# Patient Record
Sex: Male | Born: 1955 | Race: White | Hispanic: No | Marital: Married | Smoking: Never smoker
Health system: Southern US, Community
[De-identification: ages and names within clinical notes are randomized; demographics above are authoritative.]

## PROBLEM LIST (undated history)

## (undated) DIAGNOSIS — I10 Essential (primary) hypertension: Secondary | ICD-10-CM

## (undated) DIAGNOSIS — E785 Hyperlipidemia, unspecified: Secondary | ICD-10-CM

## (undated) DIAGNOSIS — E119 Type 2 diabetes mellitus without complications: Secondary | ICD-10-CM

## (undated) DIAGNOSIS — D631 Anemia in chronic kidney disease: Secondary | ICD-10-CM

## (undated) DIAGNOSIS — Z940 Kidney transplant status: Secondary | ICD-10-CM

## (undated) DIAGNOSIS — N289 Disorder of kidney and ureter, unspecified: Secondary | ICD-10-CM

## (undated) DIAGNOSIS — C801 Malignant (primary) neoplasm, unspecified: Secondary | ICD-10-CM

## (undated) DIAGNOSIS — E611 Iron deficiency: Secondary | ICD-10-CM

## (undated) DIAGNOSIS — H548 Legal blindness, as defined in USA: Secondary | ICD-10-CM

## (undated) DIAGNOSIS — H269 Unspecified cataract: Secondary | ICD-10-CM

## (undated) DIAGNOSIS — E213 Hyperparathyroidism, unspecified: Secondary | ICD-10-CM

## (undated) HISTORY — PX: HERNIA REPAIR: SHX51

## (undated) HISTORY — PX: COLONOSCOPY: SHX174

## (undated) HISTORY — PX: KIDNEY TRANSPLANT: SHX239

## (undated) HISTORY — DX: Hyperparathyroidism, unspecified: E21.3

## (undated) HISTORY — PX: AV FISTULA PLACEMENT: SHX1204

## (undated) HISTORY — DX: Iron deficiency: E61.1

## (undated) HISTORY — DX: Anemia in chronic kidney disease: D63.1

## (undated) HISTORY — PX: NEPHRECTOMY TRANSPLANTED ORGAN: SUR880

## (undated) HISTORY — PX: COMBINED KIDNEY-PANCREAS TRANSPLANT: SHX1382

## (undated) HISTORY — PX: EYE SURGERY: SHX253

---

## 1998-08-06 ENCOUNTER — Encounter: Payer: Self-pay | Admitting: Vascular Surgery

## 1998-08-06 ENCOUNTER — Ambulatory Visit (HOSPITAL_COMMUNITY): Admission: RE | Admit: 1998-08-06 | Discharge: 1998-08-06 | Payer: Self-pay | Admitting: Vascular Surgery

## 1998-08-22 ENCOUNTER — Encounter (HOSPITAL_COMMUNITY): Admission: RE | Admit: 1998-08-22 | Discharge: 1998-11-20 | Payer: Self-pay | Admitting: *Deleted

## 1998-10-20 ENCOUNTER — Emergency Department (HOSPITAL_COMMUNITY): Admission: EM | Admit: 1998-10-20 | Discharge: 1998-10-20 | Payer: Self-pay | Admitting: Emergency Medicine

## 1998-10-27 ENCOUNTER — Ambulatory Visit (HOSPITAL_COMMUNITY): Admission: RE | Admit: 1998-10-27 | Discharge: 1998-10-27 | Payer: Self-pay | Admitting: Thoracic Surgery

## 1998-10-27 ENCOUNTER — Encounter: Payer: Self-pay | Admitting: Thoracic Surgery

## 1998-11-04 ENCOUNTER — Ambulatory Visit (HOSPITAL_COMMUNITY): Admission: RE | Admit: 1998-11-04 | Discharge: 1998-11-04 | Payer: Self-pay | Admitting: Nephrology

## 1998-11-04 ENCOUNTER — Encounter: Payer: Self-pay | Admitting: Nephrology

## 1998-11-19 ENCOUNTER — Ambulatory Visit (HOSPITAL_COMMUNITY): Admission: RE | Admit: 1998-11-19 | Discharge: 1998-11-19 | Payer: Self-pay | Admitting: *Deleted

## 1999-01-01 ENCOUNTER — Ambulatory Visit (HOSPITAL_COMMUNITY): Admission: RE | Admit: 1999-01-01 | Discharge: 1999-01-01 | Payer: Self-pay | Admitting: Vascular Surgery

## 1999-05-08 ENCOUNTER — Ambulatory Visit (HOSPITAL_COMMUNITY): Admission: RE | Admit: 1999-05-08 | Discharge: 1999-05-08 | Payer: Self-pay | Admitting: Vascular Surgery

## 1999-05-10 ENCOUNTER — Encounter: Payer: Self-pay | Admitting: Vascular Surgery

## 1999-05-10 ENCOUNTER — Ambulatory Visit (HOSPITAL_COMMUNITY): Admission: EM | Admit: 1999-05-10 | Discharge: 1999-05-10 | Payer: Self-pay | Admitting: Emergency Medicine

## 1999-11-11 ENCOUNTER — Encounter: Payer: Self-pay | Admitting: Vascular Surgery

## 1999-11-11 ENCOUNTER — Ambulatory Visit: Admission: RE | Admit: 1999-11-11 | Discharge: 1999-11-11 | Payer: Self-pay | Admitting: Vascular Surgery

## 2000-02-09 ENCOUNTER — Ambulatory Visit (HOSPITAL_COMMUNITY): Admission: RE | Admit: 2000-02-09 | Discharge: 2000-02-09 | Payer: Self-pay | Admitting: Vascular Surgery

## 2000-02-09 ENCOUNTER — Encounter: Payer: Self-pay | Admitting: Vascular Surgery

## 2000-02-23 ENCOUNTER — Ambulatory Visit (HOSPITAL_COMMUNITY): Admission: RE | Admit: 2000-02-23 | Discharge: 2000-02-23 | Payer: Self-pay | Admitting: Nephrology

## 2000-02-23 ENCOUNTER — Encounter: Payer: Self-pay | Admitting: Nephrology

## 2003-03-30 DIAGNOSIS — N289 Disorder of kidney and ureter, unspecified: Secondary | ICD-10-CM

## 2003-03-30 DIAGNOSIS — Z940 Kidney transplant status: Secondary | ICD-10-CM

## 2003-03-30 HISTORY — DX: Disorder of kidney and ureter, unspecified: N28.9

## 2003-03-30 HISTORY — DX: Kidney transplant status: Z94.0

## 2003-10-26 ENCOUNTER — Emergency Department (HOSPITAL_COMMUNITY): Admission: EM | Admit: 2003-10-26 | Discharge: 2003-10-26 | Payer: Self-pay | Admitting: Emergency Medicine

## 2008-10-22 ENCOUNTER — Encounter: Admission: RE | Admit: 2008-10-22 | Discharge: 2008-10-22 | Payer: Self-pay | Admitting: Nephrology

## 2009-07-14 ENCOUNTER — Observation Stay (HOSPITAL_COMMUNITY): Admission: EM | Admit: 2009-07-14 | Discharge: 2009-07-14 | Payer: Self-pay | Admitting: Emergency Medicine

## 2010-06-16 LAB — COMPREHENSIVE METABOLIC PANEL
Alkaline Phosphatase: 83 U/L (ref 39–117)
BUN: 20 mg/dL (ref 6–23)
CO2: 25 mEq/L (ref 19–32)
Calcium: 8.8 mg/dL (ref 8.4–10.5)
Creatinine, Ser: 1.71 mg/dL — ABNORMAL HIGH (ref 0.4–1.5)
Glucose, Bld: 126 mg/dL — ABNORMAL HIGH (ref 70–99)
Potassium: 3.7 mEq/L (ref 3.5–5.1)

## 2010-06-16 LAB — URINALYSIS, ROUTINE W REFLEX MICROSCOPIC
Bilirubin Urine: NEGATIVE
Glucose, UA: NEGATIVE mg/dL
Urobilinogen, UA: 0.2 mg/dL (ref 0.0–1.0)
pH: 6.5 (ref 5.0–8.0)

## 2010-06-16 LAB — CULTURE, BLOOD (ROUTINE X 2): Culture: NO GROWTH

## 2010-06-16 LAB — BASIC METABOLIC PANEL
CO2: 28 mEq/L (ref 19–32)
Chloride: 100 mEq/L (ref 96–112)
GFR calc Af Amer: 48 mL/min — ABNORMAL LOW (ref >60)
GFR calc non Af Amer: 40 mL/min — ABNORMAL LOW (ref >60)
Glucose, Bld: 177 mg/dL — ABNORMAL HIGH (ref 70–99)

## 2010-06-16 LAB — CBC
HCT: 38.6 % — ABNORMAL LOW (ref 39.0–52.0)
Hemoglobin: 12.7 g/dL — ABNORMAL LOW (ref 13.0–17.0)
Hemoglobin: 15.5 g/dL (ref 13.0–17.0)
MCHC: 33.2 g/dL (ref 30.0–36.0)
MCV: 89.5 fL (ref 78.0–100.0)
Platelets: 292 10*3/uL (ref 150–400)
RDW: 14.2 % (ref 11.5–15.5)
RDW: 14.2 % (ref 11.5–15.5)
WBC: 12.3 10*3/uL — ABNORMAL HIGH (ref 4.0–10.5)

## 2010-06-16 LAB — DIFFERENTIAL
Basophils Absolute: 0.1 10*3/uL (ref 0.0–0.1)
Basophils Relative: 0 % (ref 0–1)
Eosinophils Absolute: 0 10*3/uL (ref 0.0–0.7)
Eosinophils Relative: 0 % (ref 0–5)
Eosinophils Relative: 0 % (ref 0–5)
Lymphocytes Relative: 2 % — ABNORMAL LOW (ref 12–46)
Lymphs Abs: 0.4 10*3/uL — ABNORMAL LOW (ref 0.7–4.0)
Monocytes Absolute: 1.7 10*3/uL — ABNORMAL HIGH (ref 0.1–1.0)
Monocytes Relative: 14 % — ABNORMAL HIGH (ref 3–12)
Monocytes Relative: 8 % (ref 3–12)
Neutro Abs: 18.2 10*3/uL — ABNORMAL HIGH (ref 1.7–7.7)

## 2010-06-16 LAB — POCT I-STAT, CHEM 8
Calcium, Ion: 1.18 mmol/L (ref 1.12–1.32)
Creatinine, Ser: 1.8 mg/dL — ABNORMAL HIGH (ref 0.4–1.5)
Glucose, Bld: 176 mg/dL — ABNORMAL HIGH (ref 70–99)
HCT: 54 % — ABNORMAL HIGH (ref 39.0–52.0)

## 2010-06-16 LAB — GLUCOSE, CAPILLARY: Glucose-Capillary: 145 mg/dL — ABNORMAL HIGH (ref 70–99)

## 2010-06-16 LAB — LIPID PANEL
Triglycerides: 119 mg/dL (ref <150)
VLDL: 24 mg/dL (ref 0–40)

## 2010-06-16 LAB — PROTIME-INR: INR: 1.01 (ref 0.00–1.49)

## 2010-06-16 LAB — TACROLIMUS, BLOOD: Tacrolimus Lvl: 11.3 ng/mL (ref 5.0–20.0)

## 2010-06-16 LAB — URINE MICROSCOPIC-ADD ON

## 2010-06-16 LAB — URINE CULTURE: Culture: NO GROWTH

## 2010-06-16 LAB — POCT CARDIAC MARKERS: Myoglobin, poc: 331 ng/mL (ref 12–200)

## 2010-06-16 LAB — HEPATIC FUNCTION PANEL: AST: 22 U/L (ref 0–37)

## 2010-06-16 LAB — HEMOGLOBIN A1C: Hgb A1c MFr Bld: 7.5 % — ABNORMAL HIGH (ref <5.7)

## 2010-08-14 NOTE — Op Note (Signed)
[facility]  Patient:    Eric Mccarty, Eric Mccarty                       MRN: [id number] Proc. Date: 11/11/99 Attending:  Nelda Severe. Kellie Simmering, M.D.                           Operative Report  PREOPERATIVE DIAGNOSIS:  Thrombosed AV Gore-Tex graft left forearm.  POSTOPERATIVE DIAGNOSIS:  Thrombosed AV Gore-Tex graft left forearm.  OPERATION:  Thrombectomy AV Gore-Tex graft left forearm with insertion of new segment of graft from existing graft to basilic vein with 6 mm Gore-Tex.  SURGEON:  Nelda Severe. Kellie Simmering, M.D.  FIRST ASSISTANT:  Nurse.  ANESTHESIA:  Local.  PROCEDURE:  The patient was taken to the operating room and placed in supine position.  At which time, the left upper extremity was prepped with Betadine scrubbing solution and draped in routine sterile manner.  After infiltration of 1% Xylocaine with epinephrine, a longitudinal incision was made through the antecubital area through the previous scar.  The Gore-Tex and basilic vein anastomosis was dissected free proximally and distally and 3000 units of heparin given intravenously.  A transverse opening was made in the graft proximally, anastomosis graft itself easily and thrombectomized with excellent inflow being reestablished.  There was some interval hyperplasia at the venous anastomosis and over the last 2 cm of the graft and this area was completely resected.  The vein proximal to this was 5 mm in size and had excellent back bleeding.  A new piece of 6 mm graft was anastomosed end-to-end to the old graft and end-to-end to the vein with 6-0 Prolene.  Clamps were released and there was an excellent pulse and thrill in the graft.  No Protamine was given. The wound was irrigated with saline.  It was closed in layers with Vicryl in a subcuticular fashion.  Sterile dressing was applied.  The patient was taken to the recovery room in satisfactory condition. DD:  11/11/99 TD:  11/11/99 Job:  [id number]

## 2010-08-14 NOTE — Op Note (Signed)
[place]  Patient:    Eric Mccarty, Eric Mccarty                    MRN: [id number] Proc. Date: 02/09/00 Adm. Date:  [id number] Attending:  Dorothea Glassman CC:         Keturah Barre Marlyn Corporal, M.D. (2)  Joyice Faster. Deterding, M.D.   Operative Report  PREOPERATIVE DIAGNOSIS:  Thrombosed left arm arteriovenous Gore-Tex graft.  POSTOPERATIVE DIAGNOSIS:  Thrombosed left arm arteriovenous Gore-Tex graft.  OPERATION:  Thrombectomy of left arm arteriovenous Gore-Tex graft, with patch angioplasty to the venous end.  SURGEON:  D. Marlyn Corporal, M.D.  ASSISTANT:  Ricki Miller, P.A.-C.  ANESTHESIA:  Xylocaine 1%, IV sedation.  DESCRIPTION OF PROCEDURE:  After IV sedation, the left arm was prepped and draped in the usual sterile manner.  The area was infiltrated with 1% Xylocaine at the venous end, which is above the antecubital fossa, and the previous longitudinal incision was opened, and the venous end was dissected out.  It was an end-to-end anastomosis.  The patient was given 4000 units of heparin.  The longitudinal incision was made in the Gore-Tex, and it was extended up through the end-to-end vein. There was a mild intimal hyperplasia at the anastomosis.  This was removed.  A proximal and distal thrombectomy was carried out with the #5 Fogarty catheter, with good backbleeding and excellent inflow.  The graft was then clamped and then taking the Gore-Tex patch, a patch angioplasty was performed in an oval-shaped fashion using #6-0 Prolene in a running continuous fashion.  All clamps were removed.  The wound was closed with #3-0 Vicryl and Steri-Strips.  The patient returned to the recovery room in stable condition.DD:  02/09/00 TD:  02/09/00 Job: [id number]

## 2013-01-15 ENCOUNTER — Emergency Department (HOSPITAL_COMMUNITY): Payer: BC Managed Care – PPO | Admitting: Anesthesiology

## 2013-01-15 ENCOUNTER — Inpatient Hospital Stay (HOSPITAL_BASED_OUTPATIENT_CLINIC_OR_DEPARTMENT_OTHER)
Admission: EM | Admit: 2013-01-15 | Discharge: 2013-01-17 | DRG: 264 | Disposition: A | Discharge status: Home / Self Care | Payer: BC Managed Care – PPO | Attending: Vascular Surgery | Admitting: Vascular Surgery

## 2013-01-15 ENCOUNTER — Encounter (HOSPITAL_COMMUNITY)
Admission: EM | Disposition: A | Discharge status: Home / Self Care | Payer: Self-pay | Source: Home / Self Care | Attending: Vascular Surgery

## 2013-01-15 ENCOUNTER — Encounter (HOSPITAL_COMMUNITY): Payer: BC Managed Care – PPO | Admitting: Anesthesiology

## 2013-01-15 ENCOUNTER — Encounter (HOSPITAL_BASED_OUTPATIENT_CLINIC_OR_DEPARTMENT_OTHER): Payer: Self-pay | Admitting: Emergency Medicine

## 2013-01-15 DIAGNOSIS — E119 Type 2 diabetes mellitus without complications: Secondary | ICD-10-CM | POA: Diagnosis present

## 2013-01-15 DIAGNOSIS — Z940 Kidney transplant status: Secondary | ICD-10-CM

## 2013-01-15 DIAGNOSIS — E785 Hyperlipidemia, unspecified: Secondary | ICD-10-CM | POA: Diagnosis present

## 2013-01-15 DIAGNOSIS — Z794 Long term (current) use of insulin: Secondary | ICD-10-CM

## 2013-01-15 DIAGNOSIS — I10 Essential (primary) hypertension: Secondary | ICD-10-CM | POA: Diagnosis present

## 2013-01-15 DIAGNOSIS — Z79899 Other long term (current) drug therapy: Secondary | ICD-10-CM

## 2013-01-15 DIAGNOSIS — Z23 Encounter for immunization: Secondary | ICD-10-CM

## 2013-01-15 DIAGNOSIS — T82898A Other specified complication of vascular prosthetic devices, implants and grafts, initial encounter: Principal | ICD-10-CM | POA: Diagnosis present

## 2013-01-15 DIAGNOSIS — Z9483 Pancreas transplant status: Secondary | ICD-10-CM

## 2013-01-15 DIAGNOSIS — I742 Embolism and thrombosis of arteries of the upper extremities: Secondary | ICD-10-CM

## 2013-01-15 DIAGNOSIS — Y838 Other surgical procedures as the cause of abnormal reaction of the patient, or of later complication, without mention of misadventure at the time of the procedure: Secondary | ICD-10-CM | POA: Diagnosis present

## 2013-01-15 DIAGNOSIS — Z7982 Long term (current) use of aspirin: Secondary | ICD-10-CM

## 2013-01-15 DIAGNOSIS — I748 Embolism and thrombosis of other arteries: Secondary | ICD-10-CM | POA: Diagnosis present

## 2013-01-15 HISTORY — DX: Type 2 diabetes mellitus without complications: E11.9

## 2013-01-15 HISTORY — DX: Essential (primary) hypertension: I10

## 2013-01-15 HISTORY — DX: Disorder of kidney and ureter, unspecified: N28.9

## 2013-01-15 HISTORY — DX: Hyperlipidemia, unspecified: E78.5

## 2013-01-15 HISTORY — PX: EMBOLECTOMY: SHX44

## 2013-01-15 LAB — PROTIME-INR: Prothrombin Time: 13.1 seconds (ref 11.6–15.2)

## 2013-01-15 LAB — CBC WITH DIFFERENTIAL/PLATELET
Eosinophils Absolute: 0 10*3/uL (ref 0.0–0.7)
HCT: 39.2 % (ref 39.0–52.0)
Hemoglobin: 12.5 g/dL — ABNORMAL LOW (ref 13.0–17.0)
Lymphs Abs: 1.3 10*3/uL (ref 0.7–4.0)
MCH: 28.1 pg (ref 26.0–34.0)
Monocytes Absolute: 1 10*3/uL (ref 0.1–1.0)
Monocytes Relative: 11 % (ref 3–12)
Neutrophils Relative %: 74 % (ref 43–77)
RBC: 4.45 MIL/uL (ref 4.22–5.81)

## 2013-01-15 LAB — GLUCOSE, CAPILLARY: Glucose-Capillary: 100 mg/dL — ABNORMAL HIGH (ref 70–99)

## 2013-01-15 LAB — COMPREHENSIVE METABOLIC PANEL
Alkaline Phosphatase: 103 U/L (ref 39–117)
BUN: 43 mg/dL — ABNORMAL HIGH (ref 6–23)
Chloride: 97 mEq/L (ref 96–112)
GFR calc Af Amer: 42 mL/min — ABNORMAL LOW (ref >90)
Glucose, Bld: 214 mg/dL — ABNORMAL HIGH (ref 70–99)
Potassium: 4.2 mEq/L (ref 3.5–5.1)
Total Bilirubin: 0.3 mg/dL (ref 0.3–1.2)
Total Protein: 7.2 g/dL (ref 6.0–8.3)

## 2013-01-15 SURGERY — EMBOLECTOMY
Anesthesia: General | Site: Arm Upper | Laterality: Left | Wound class: Clean

## 2013-01-15 MED ORDER — OXYCODONE HCL 5 MG PO TABS: 5.0000 mg | ORAL_TABLET | ORAL | Status: DC | PRN

## 2013-01-15 MED ORDER — PIOGLITAZONE HCL 45 MG PO TABS
45.0000 mg | ORAL_TABLET | Freq: Every day | ORAL | Status: DC
  Administered 2013-01-16 – 2013-01-17 (×2): 45 mg via ORAL
  Filled 2013-01-15 (×2): qty 1

## 2013-01-15 MED ORDER — HYDRALAZINE HCL 20 MG/ML IJ SOLN: 10.0000 mg | INTRAMUSCULAR | Status: DC | PRN

## 2013-01-15 MED ORDER — HYDROMORPHONE HCL PF 1 MG/ML IJ SOLN: 0.2500 mg | INTRAMUSCULAR | Status: DC | PRN

## 2013-01-15 MED ORDER — SODIUM CHLORIDE 0.9 % IV SOLN
INTRAVENOUS | Status: DC
  Administered 2013-01-15 – 2013-01-17 (×3): via INTRAVENOUS

## 2013-01-15 MED ORDER — ATORVASTATIN CALCIUM 10 MG PO TABS
10.0000 mg | ORAL_TABLET | Freq: Every day | ORAL | Status: DC
  Administered 2013-01-15 – 2013-01-16 (×2): 10 mg via ORAL
  Filled 2013-01-15 (×3): qty 1

## 2013-01-15 MED ORDER — CLONIDINE HCL 0.3 MG PO TABS
0.3000 mg | ORAL_TABLET | Freq: Three times a day (TID) | ORAL | Status: DC
Start: 2013-01-15 — End: 2013-01-17
  Administered 2013-01-15 – 2013-01-17 (×5): 0.3 mg via ORAL
  Filled 2013-01-15 (×7): qty 1

## 2013-01-15 MED ORDER — ACETAMINOPHEN 325 MG PO TABS: 325.0000 mg | ORAL_TABLET | ORAL | Status: DC | PRN

## 2013-01-15 MED ORDER — CALCITRIOL 0.25 MCG PO CAPS
0.2500 ug | ORAL_CAPSULE | Freq: Every day | ORAL | Status: DC
  Administered 2013-01-16 – 2013-01-17 (×2): 0.25 ug via ORAL
  Filled 2013-01-15 (×2): qty 1

## 2013-01-15 MED ORDER — SODIUM CHLORIDE 0.9 % IV SOLN: 500.0000 mL | Freq: Once | INTRAVENOUS | Status: AC | PRN

## 2013-01-15 MED ORDER — SUCCINYLCHOLINE CHLORIDE 20 MG/ML IJ SOLN
INTRAMUSCULAR | Status: DC | PRN
  Administered 2013-01-15: 100 mg via INTRAVENOUS

## 2013-01-15 MED ORDER — SENNOSIDES-DOCUSATE SODIUM 8.6-50 MG PO TABS
1.0000 | ORAL_TABLET | Freq: Two times a day (BID) | ORAL | Status: DC
  Administered 2013-01-16 – 2013-01-17 (×3): 1 via ORAL
  Filled 2013-01-15 (×5): qty 1

## 2013-01-15 MED ORDER — LABETALOL HCL 5 MG/ML IV SOLN
10.0000 mg | INTRAVENOUS | Status: DC | PRN
  Administered 2013-01-15 – 2013-01-17 (×2): 10 mg via INTRAVENOUS
  Filled 2013-01-15: qty 4

## 2013-01-15 MED ORDER — SODIUM CHLORIDE 0.9 % IR SOLN
Status: DC | PRN
  Administered 2013-01-15: 18:00:00

## 2013-01-15 MED ORDER — ONDANSETRON HCL 4 MG/2ML IJ SOLN: 4.0000 mg | Freq: Four times a day (QID) | INTRAMUSCULAR | Status: DC | PRN

## 2013-01-15 MED ORDER — MORPHINE SULFATE 2 MG/ML IJ SOLN: 2.0000 mg | INTRAMUSCULAR | Status: DC | PRN

## 2013-01-15 MED ORDER — 0.9 % SODIUM CHLORIDE (POUR BTL) OPTIME
TOPICAL | Status: DC | PRN
  Administered 2013-01-15: 1000 mL

## 2013-01-15 MED ORDER — MEPERIDINE HCL 25 MG/ML IJ SOLN: 6.2500 mg | INTRAMUSCULAR | Status: DC | PRN

## 2013-01-15 MED ORDER — THROMBIN 20000 UNITS EX KIT
PACK | CUTANEOUS | Status: DC | PRN
  Administered 2013-01-15: 19:00:00 via TOPICAL

## 2013-01-15 MED ORDER — HEPARIN SODIUM (PORCINE) 1000 UNIT/ML IJ SOLN
INTRAMUSCULAR | Status: DC | PRN
  Administered 2013-01-15: 8000 [IU] via INTRAVENOUS

## 2013-01-15 MED ORDER — CEFAZOLIN SODIUM-DEXTROSE 2-3 GM-% IV SOLR
2.0000 g | INTRAVENOUS | Status: AC
  Administered 2013-01-15: 2 g via INTRAVENOUS
  Filled 2013-01-15: qty 50

## 2013-01-15 MED ORDER — THROMBIN 20000 UNITS EX SOLR
CUTANEOUS | Status: AC
  Filled 2013-01-15: qty 20000

## 2013-01-15 MED ORDER — INSULIN GLARGINE 100 UNIT/ML ~~LOC~~ SOLN
18.0000 [IU] | Freq: Every day | SUBCUTANEOUS | Status: DC
  Administered 2013-01-15 – 2013-01-16 (×2): 18 [IU] via SUBCUTANEOUS
  Filled 2013-01-15 (×3): qty 0.18

## 2013-01-15 MED ORDER — LACTATED RINGERS IV SOLN
INTRAVENOUS | Status: DC | PRN
  Administered 2013-01-15 (×2): via INTRAVENOUS

## 2013-01-15 MED ORDER — LACTATED RINGERS IV SOLN
INTRAVENOUS | Status: DC
  Administered 2013-01-15: 17:00:00 via INTRAVENOUS

## 2013-01-15 MED ORDER — INSULIN ASPART 100 UNIT/ML ~~LOC~~ SOLN
0.0000 [IU] | Freq: Three times a day (TID) | SUBCUTANEOUS | Status: DC
  Administered 2013-01-16: 2 [IU] via SUBCUTANEOUS
  Administered 2013-01-16: 1 [IU] via SUBCUTANEOUS
  Administered 2013-01-17: 2 [IU] via SUBCUTANEOUS

## 2013-01-15 MED ORDER — PROPOFOL 10 MG/ML IV BOLUS
INTRAVENOUS | Status: DC | PRN
  Administered 2013-01-15: 30 mg via INTRAVENOUS
  Administered 2013-01-15: 110 mg via INTRAVENOUS

## 2013-01-15 MED ORDER — ACETAMINOPHEN 650 MG RE SUPP: 325.0000 mg | RECTAL | Status: DC | PRN

## 2013-01-15 MED ORDER — PHENOL 1.4 % MT LIQD: 1.0000 | OROMUCOSAL | Status: DC | PRN

## 2013-01-15 MED ORDER — CEFAZOLIN SODIUM 1-5 GM-% IV SOLN
1.0000 g | Freq: Two times a day (BID) | INTRAVENOUS | Status: AC
  Administered 2013-01-16: 1 g via INTRAVENOUS
  Filled 2013-01-15 (×2): qty 50

## 2013-01-15 MED ORDER — LABETALOL HCL 200 MG PO TABS
200.0000 mg | ORAL_TABLET | Freq: Two times a day (BID) | ORAL | Status: DC
  Administered 2013-01-15 – 2013-01-17 (×4): 200 mg via ORAL
  Filled 2013-01-15 (×5): qty 1

## 2013-01-15 MED ORDER — ONDANSETRON HCL 4 MG/2ML IJ SOLN: 4.0000 mg | Freq: Once | INTRAMUSCULAR | Status: DC | PRN

## 2013-01-15 MED ORDER — CEFAZOLIN SODIUM-DEXTROSE 2-3 GM-% IV SOLR
INTRAVENOUS | Status: AC
  Filled 2013-01-15: qty 50

## 2013-01-15 MED ORDER — METOPROLOL TARTRATE 1 MG/ML IV SOLN: 2.0000 mg | INTRAVENOUS | Status: DC | PRN

## 2013-01-15 MED ORDER — AMITRIPTYLINE HCL 25 MG PO TABS
25.0000 mg | ORAL_TABLET | Freq: Every day | ORAL | Status: DC
  Administered 2013-01-15 – 2013-01-16 (×2): 25 mg via ORAL
  Filled 2013-01-15 (×3): qty 1

## 2013-01-15 MED ORDER — OXYCODONE HCL 5 MG PO TABS: 5.0000 mg | ORAL_TABLET | Freq: Once | ORAL | Status: DC | PRN

## 2013-01-15 MED ORDER — FUROSEMIDE 20 MG PO TABS
20.0000 mg | ORAL_TABLET | Freq: Two times a day (BID) | ORAL | Status: DC
  Administered 2013-01-16 – 2013-01-17 (×3): 20 mg via ORAL
  Filled 2013-01-15 (×5): qty 1

## 2013-01-15 MED ORDER — ONDANSETRON HCL 4 MG/2ML IJ SOLN
INTRAMUSCULAR | Status: DC | PRN
  Administered 2013-01-15: 4 mg via INTRAVENOUS

## 2013-01-15 MED ORDER — GLIPIZIDE 10 MG PO TABS
10.0000 mg | ORAL_TABLET | Freq: Two times a day (BID) | ORAL | Status: DC
Start: 2013-01-16 — End: 2013-01-17
  Administered 2013-01-16 – 2013-01-17 (×3): 10 mg via ORAL
  Filled 2013-01-15 (×5): qty 1

## 2013-01-15 MED ORDER — HEPARIN (PORCINE) IN NACL 100-0.45 UNIT/ML-% IJ SOLN
1200.0000 [IU]/h | INTRAMUSCULAR | Status: DC
  Administered 2013-01-15 – 2013-01-16 (×2): 1200 [IU]/h via INTRAVENOUS
  Filled 2013-01-15 (×4): qty 250

## 2013-01-15 MED ORDER — FENTANYL CITRATE 0.05 MG/ML IJ SOLN
INTRAMUSCULAR | Status: DC | PRN
  Administered 2013-01-15: 50 ug via INTRAVENOUS
  Administered 2013-01-15: 150 ug via INTRAVENOUS
  Administered 2013-01-15 (×2): 25 ug via INTRAVENOUS

## 2013-01-15 MED ORDER — LABETALOL HCL 5 MG/ML IV SOLN
INTRAVENOUS | Status: AC
  Filled 2013-01-15: qty 4

## 2013-01-15 MED ORDER — MYCOPHENOLATE MOFETIL 250 MG PO CAPS
500.0000 mg | ORAL_CAPSULE | Freq: Three times a day (TID) | ORAL | Status: DC
  Administered 2013-01-15 – 2013-01-17 (×5): 500 mg via ORAL
  Filled 2013-01-15 (×7): qty 2

## 2013-01-15 MED ORDER — TACROLIMUS 1 MG PO CAPS
1.0000 mg | ORAL_CAPSULE | Freq: Two times a day (BID) | ORAL | Status: DC
  Administered 2013-01-15 – 2013-01-17 (×4): 1 mg via ORAL
  Filled 2013-01-15 (×5): qty 1

## 2013-01-15 MED ORDER — LIDOCAINE HCL (CARDIAC) 20 MG/ML IV SOLN
INTRAVENOUS | Status: DC | PRN
  Administered 2013-01-15: 50 mg via INTRAVENOUS

## 2013-01-15 MED ORDER — PNEUMOCOCCAL VAC POLYVALENT 25 MCG/0.5ML IJ INJ
0.5000 mL | INJECTION | INTRAMUSCULAR | Status: AC
Start: 2013-01-16 — End: 2013-01-16
  Administered 2013-01-16: 0.5 mL via INTRAMUSCULAR
  Filled 2013-01-15: qty 0.5

## 2013-01-15 MED ORDER — THROMBIN 20000 UNITS EX KIT
PACK | CUTANEOUS | Status: AC
  Filled 2013-01-15: qty 1

## 2013-01-15 MED ORDER — MIDAZOLAM HCL 5 MG/5ML IJ SOLN
INTRAMUSCULAR | Status: DC | PRN
  Administered 2013-01-15: 2 mg via INTRAVENOUS

## 2013-01-15 MED ORDER — OXYCODONE HCL 5 MG/5ML PO SOLN: 5.0000 mg | Freq: Once | ORAL | Status: DC | PRN

## 2013-01-15 SURGICAL SUPPLY — 61 items
ADH SKN CLS APL DERMABOND .7 (GAUZE/BANDAGES/DRESSINGS) ×1
BANDAGE ESMARK 6X9 LF (GAUZE/BANDAGES/DRESSINGS) IMPLANT
BNDG CMPR 9X6 STRL LF SNTH (GAUZE/BANDAGES/DRESSINGS)
BNDG ESMARK 6X9 LF (GAUZE/BANDAGES/DRESSINGS)
CANISTER SUCTION 2500CC (MISCELLANEOUS) ×2 IMPLANT
CATH EMB 3FR 80CM (CATHETERS) ×1 IMPLANT
CATH EMB 4FR 80CM (CATHETERS) IMPLANT
CATH EMB 5FR 80CM (CATHETERS) IMPLANT
CLIP TI MEDIUM 24 (CLIP) ×2 IMPLANT
CLIP TI WIDE RED SMALL 24 (CLIP) ×2 IMPLANT
CONT SPEC 4OZ CLIKSEAL STRL BL (MISCELLANEOUS) ×1 IMPLANT
COVER SURGICAL LIGHT HANDLE (MISCELLANEOUS) ×2 IMPLANT
CUFF TOURNIQUET SINGLE 24IN (TOURNIQUET CUFF) IMPLANT
CUFF TOURNIQUET SINGLE 34IN LL (TOURNIQUET CUFF) IMPLANT
CUFF TOURNIQUET SINGLE 44IN (TOURNIQUET CUFF) IMPLANT
DECANTER SPIKE VIAL GLASS SM (MISCELLANEOUS) IMPLANT
DERMABOND ADVANCED (GAUZE/BANDAGES/DRESSINGS) ×1
DERMABOND ADVANCED .7 DNX12 (GAUZE/BANDAGES/DRESSINGS) IMPLANT
DRAIN SNY 10X20 3/4 PERF (WOUND CARE) IMPLANT
DRAPE WARM FLUID 44X44 (DRAPE) ×1 IMPLANT
DRAPE X-RAY CASS 24X20 (DRAPES) IMPLANT
DRSG COVADERM 4X8 (GAUZE/BANDAGES/DRESSINGS) IMPLANT
ELECT REM PT RETURN 9FT ADLT (ELECTROSURGICAL) ×2
ELECTRODE REM PT RTRN 9FT ADLT (ELECTROSURGICAL) ×1 IMPLANT
EVACUATOR SILICONE 100CC (DRAIN) IMPLANT
GLOVE BIO SURGEON STRL SZ7.5 (GLOVE) ×2 IMPLANT
GLOVE BIOGEL PI IND STRL 7.0 (GLOVE) IMPLANT
GLOVE BIOGEL PI IND STRL 7.5 (GLOVE) IMPLANT
GLOVE BIOGEL PI INDICATOR 7.0 (GLOVE) ×2
GLOVE BIOGEL PI INDICATOR 7.5 (GLOVE) ×1
GLOVE SS BIOGEL STRL SZ 6.5 (GLOVE) IMPLANT
GLOVE SUPERSENSE BIOGEL SZ 6.5 (GLOVE) ×1
GOWN PREVENTION PLUS XLARGE (GOWN DISPOSABLE) ×2 IMPLANT
GOWN STRL NON-REIN LRG LVL3 (GOWN DISPOSABLE) ×5 IMPLANT
KIT BASIN OR (CUSTOM PROCEDURE TRAY) ×2 IMPLANT
KIT ROOM TURNOVER OR (KITS) ×2 IMPLANT
LOOP VESSEL MINI RED (MISCELLANEOUS) ×1 IMPLANT
NS IRRIG 1000ML POUR BTL (IV SOLUTION) ×4 IMPLANT
PACK CV ACCESS (CUSTOM PROCEDURE TRAY) ×1 IMPLANT
PACK PERIPHERAL VASCULAR (CUSTOM PROCEDURE TRAY) ×1 IMPLANT
PAD ARMBOARD 7.5X6 YLW CONV (MISCELLANEOUS) ×4 IMPLANT
PADDING CAST COTTON 6X4 STRL (CAST SUPPLIES) IMPLANT
PATCH VASCULAR VASCU GUARD 1X6 (Vascular Products) ×1 IMPLANT
SET COLLECT BLD 21X3/4 12 (NEEDLE) IMPLANT
SPONGE SURGIFOAM ABS GEL 100 (HEMOSTASIS) IMPLANT
STAPLER VISISTAT 35W (STAPLE) IMPLANT
STOPCOCK 4 WAY LG BORE MALE ST (IV SETS) IMPLANT
SUT PROLENE 5 0 C 1 24 (SUTURE) ×1 IMPLANT
SUT PROLENE 6 0 BV (SUTURE) ×1 IMPLANT
SUT PROLENE 6 0 CC (SUTURE) ×3 IMPLANT
SUT VIC AB 2-0 CTX 36 (SUTURE) ×1 IMPLANT
SUT VIC AB 3-0 SH 27 (SUTURE) ×2
SUT VIC AB 3-0 SH 27X BRD (SUTURE) ×1 IMPLANT
SUT VICRYL 4-0 PS2 18IN ABS (SUTURE) ×1 IMPLANT
SYR 3ML LL SCALE MARK (SYRINGE) ×2 IMPLANT
TOWEL OR 17X24 6PK STRL BLUE (TOWEL DISPOSABLE) ×4 IMPLANT
TOWEL OR 17X26 10 PK STRL BLUE (TOWEL DISPOSABLE) ×4 IMPLANT
TRAY FOLEY CATH 16FRSI W/METER (SET/KITS/TRAYS/PACK) ×1 IMPLANT
TUBING EXTENTION W/L.L. (IV SETS) IMPLANT
UNDERPAD 30X30 INCONTINENT (UNDERPADS AND DIAPERS) ×2 IMPLANT
WATER STERILE IRR 1000ML POUR (IV SOLUTION) ×2 IMPLANT

## 2013-01-15 NOTE — Transfer of Care (Signed)
Immediate Anesthesia Transfer of Care Note  Patient: Eric Mccarty  Procedure(s) Performed: Procedure(s): EMBOLECTOMY Left Brachial Artery With Patch Angioplasty (Left)  Patient Location: PACU  Anesthesia Type:General  Level of Consciousness: awake  Airway & Oxygen Therapy: Patient Spontanous Breathing and Patient connected to nasal cannula oxygen  Post-op Assessment: Report given to PACU RN, Post -op Vital signs reviewed and stable and Patient moving all extremities  Post vital signs: Reviewed and stable  Complications: No apparent anesthesia complications

## 2013-01-15 NOTE — ED Notes (Signed)
Lab called stated no labs on file. Shows in epic. Reordered.

## 2013-01-15 NOTE — Anesthesia Postprocedure Evaluation (Signed)
  Anesthesia Post-op Note  Patient: Eric Mccarty  Procedure(s) Performed: Procedure(s): EMBOLECTOMY Left Brachial Artery With Patch Angioplasty (Left)  Patient Location: PACU  Anesthesia Type:General  Level of Consciousness: awake and alert   Airway and Oxygen Therapy: Patient Spontanous Breathing  Post-op Pain: mild  Post-op Assessment: Post-op Vital signs reviewed  Post-op Vital Signs: stable  Complications: No apparent anesthesia complications

## 2013-01-15 NOTE — H&P (Signed)
[facility] [place] HISTORY AND PHYSICAL   History of Present Illness:  Patient is a 57 y.o. year old male who presents for evaluation of left hand ischemia.  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  The numbness and tingling is now intermittent but he still has early fatigue of his left hand and forearm.  No prior events. Pt with prior left radial cephalic fistula and left forearm graft for hemodialysis.  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  Baseline creatinine is just under 2.  He denies chest pain or shortness of breath.  He denies prior history of cardiac arrhythmia such as af fib.  Other medical problems include hypertension, diabetes and hyperlipidemia all of which are currently controlled.  Has been NPO since 1230 pm today.  Past Medical History  Diagnosis Date  . Renal disorder   . Hypertension   . Diabetes mellitus without complication   . Hyperlipidemia     Past Surgical History  Procedure Laterality Date  . Kidney transplant    . Combined kidney-pancreas transplant    . Av fistula placement    . Eye surgery    . Nephrectomy transplanted organ      Social History History  Substance Use Topics  . Smoking status: Never Smoker   . Smokeless tobacco: Not on file  . Alcohol Use: No    Family History No family history on file.  Allergies  No Known Allergies   No current facility-administered medications for this encounter.   Current Outpatient Prescriptions  Medication Sig Dispense Refill  . amitriptyline (ELAVIL) 25 MG tablet Take 25 mg by mouth at bedtime.      Marland Kitchen aspirin 81 MG tablet Take 81 mg by mouth daily.      Marland Kitchen atorvastatin (LIPITOR) 10 MG tablet Take 10 mg by mouth daily.      . calcitRIOL (ROCALTROL) 0.25 MCG capsule Take 0.25 mcg by mouth daily.      . cloNIDine  (CATAPRES) 0.3 MG tablet Take 0.3 mg by mouth 3 (three) times daily.      . furosemide (LASIX) 20 MG tablet Take 20 mg by mouth 2 (two) times daily.      Marland Kitchen glipiZIDE (GLUCOTROL) 10 MG tablet Take 10 mg by mouth 2 (two) times daily before a meal.      . insulin glargine (LANTUS) 100 UNIT/ML injection Inject 18 Units into the skin at bedtime.      Marland Kitchen labetalol (NORMODYNE) 200 MG tablet Take 200 mg by mouth 2 (two) times daily.      . mycophenolate (CELLCEPT) 500 MG tablet Take by mouth 3 (three) times daily.      . pioglitazone (ACTOS) 45 MG tablet Take 45 mg by mouth daily.      . sennosides-docusate sodium (SENOKOT-S) 8.6-50 MG tablet Take 1 tablet by mouth 2 (two) times daily.      . tacrolimus (PROGRAF) 1 MG capsule Take 1 mg by mouth 2 (two) times daily.        ROS:   General:  No weight loss, Fever, chills  HEENT: No recent headaches, no nasal bleeding, no visual changes, no sore throat  Neurologic: No dizziness, blackouts, seizures. No recent symptoms of stroke or mini- stroke. No recent episodes of slurred speech, or temporary blindness.  Cardiac: No recent episodes of chest  pain/pressure, no shortness of breath at rest.  No shortness of breath with exertion.  Denies history of atrial fibrillation or irregular heartbeat  Vascular: No history of rest pain in feet.  No history of claudication.  No history of non-healing ulcer, No history of DVT   Pulmonary: No home oxygen, no productive cough, no hemoptysis,  No asthma or wheezing  Musculoskeletal:  [ ]  Arthritis, [ ]  Low back pain,  [ ]  Joint pain  Hematologic:No history of hypercoagulable state.  No history of easy bleeding.  No history of anemia  Gastrointestinal: No hematochezia or melena,  No gastroesophageal reflux, no trouble swallowing  Urinary: [x ] chronic Kidney disease, [ ]  on HD - [ ]  MWF or [ ]  TTHS, [ ]  Burning with urination, [ ]  Frequent urination, [ ]  Difficulty urinating;   Skin: No rashes  Psychological: No  history of anxiety,  No history of depression   Physical Examination  Filed Vitals:   01/15/13 1332 01/15/13 1436 01/15/13 1536  BP: 165/61 171/60 175/77  Pulse: 74 71 68  Temp: 99 F (37.2 C)  98.5 F (36.9 C)  TempSrc: Oral  Oral  Resp: 18 18   Height: 5\' 6"  (1.676 m)    Weight: 193 lb (87.544 kg)    SpO2: 99% 100% 97%    Body mass index is 31.17 kg/(m^2).  General:  Alert and oriented, no acute distress HEENT: Normal Neck: No bruit or JVD Pulmonary: Clear to auscultation bilaterally Cardiac: Regular Rate and Rhythm Abdomen: Soft, non-tender, non-distended, no mass Skin: No rash, hemosiderin staining calf area bilat., no ulcers, left hand more pale than right Extremity Pulses:  2+ radial, brachial right, 2+ brachial pulse left, absent left radial and ulnar pulse,2+ femoral, dorsalis pedis bilaterally, left hand cooler than right, scars consistent with left radial cephalic fistula and left forearm graft Musculoskeletal: No deformity or edema  Neurologic: Upper and lower extremity motor 5/5 and symmetric  DATA:  Left upper extremity duplex distal brachial artery/radial artery occlusion with distal monophasic reconstitution   ASSESSMENT:  Ischemia left hand, pallor, pulse loss, decreased temp   PLAN:  Exploration of left brachial artery in OR most likely embolus but could be chronic changes from prior access.  Procedure risk benefits discussed with patient.  Ruta Hinds, MD [facility] [place] Office: [phone] Pager: [phone]

## 2013-01-15 NOTE — Progress Notes (Signed)
ANTICOAGULATION CONSULT NOTE - Initial Consult  Pharmacy Consult for Heparin Indication: s/p thromboembolectomy of L brachial artery  No Known Allergies  Patient Measurements: Height: 5' 6.75" (169.5 cm) Weight: 193 lb (87.544 kg) IBW/kg (Calculated) : 65.53 Heparin Dosing Weight: 84.5 kg   Vital Signs: Temp: 97.6 F (36.4 C) (10/20 2100) Temp src: Oral (10/20 2100) BP: 178/82 mmHg (10/20 2100) Pulse Rate: 65 (10/20 2100)  Labs:  Recent Labs  01/15/13 1641  HGB 12.5*  HCT 39.2  PLT 245  APTT 31  LABPROT 13.1  INR 1.01  CREATININE 1.98*    Estimated Creatinine Clearance: 44.3 ml/min (by C-G formula based on Cr of 1.98).   Medical History: Past Medical History  Diagnosis Date  . Renal disorder   . Hypertension   . Diabetes mellitus without complication   . Hyperlipidemia     Medications:  Prescriptions prior to admission  Medication Sig Dispense Refill  . amitriptyline (ELAVIL) 25 MG tablet Take 25 mg by mouth at bedtime.      Marland Kitchen aspirin 81 MG tablet Take 81 mg by mouth daily.      Marland Kitchen atorvastatin (LIPITOR) 10 MG tablet Take 10 mg by mouth daily.      . calcitRIOL (ROCALTROL) 0.25 MCG capsule Take 0.25 mcg by mouth daily.      . cloNIDine (CATAPRES) 0.3 MG tablet Take 0.3 mg by mouth 3 (three) times daily.      . furosemide (LASIX) 20 MG tablet Take 20 mg by mouth 2 (two) times daily.      Marland Kitchen glipiZIDE (GLUCOTROL) 10 MG tablet Take 10 mg by mouth 2 (two) times daily before a meal.      . insulin glargine (LANTUS) 100 UNIT/ML injection Inject 18 Units into the skin at bedtime.      Marland Kitchen labetalol (NORMODYNE) 200 MG tablet Take 200 mg by mouth 2 (two) times daily.      . mycophenolate (CELLCEPT) 500 MG tablet Take by mouth 3 (three) times daily.      . pioglitazone (ACTOS) 45 MG tablet Take 45 mg by mouth daily.      . sennosides-docusate sodium (SENOKOT-S) 8.6-50 MG tablet Take 1 tablet by mouth 2 (two) times daily.      . tacrolimus (PROGRAF) 1 MG capsule Take 1  mg by mouth 2 (two) times daily.        Assessment: L arm tight, cold, numb 57 y/o M with with prior left radial cephalic fistula and left forearm graft for hemodialysis. 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 Other PMH: HTN, DM, HLD. --OR 10/20 PM: EMBOLECTOMY Left Brachial Artery With Patch Angioplasty   Labs: Hgb 12.5, Platelets 245, Scr 1.98 (at baseline)  Goal of Therapy:  Heparin level 0.3-0.7 units/ml Monitor platelets by anticoagulation protocol: Yes   Plan:  Start IV heparin (no bolus) at 1200 units/hr Check heparin level and CBC daily.   Daouda Lonzo S. Alford Highland, PharmD, BCPS Clinical Staff Pharmacist Pager [phone]  Eilene Ghazi Stillinger 01/15/2013,9:37 PM

## 2013-01-15 NOTE — ED Notes (Signed)
Vascular Dr Oneida Alar at bedside.

## 2013-01-15 NOTE — Anesthesia Preprocedure Evaluation (Signed)
Anesthesia Evaluation  Patient identified by MRN, date of birth, ID band Patient awake    Reviewed: Allergy & Precautions, H&P , NPO status , Patient's Chart, lab work & pertinent test results  Airway Mallampati: I TM Distance: >3 FB Neck ROM: Full    Dental   Pulmonary          Cardiovascular hypertension, Pt. on medications     Neuro/Psych    GI/Hepatic   Endo/Other  diabetes, Poorly Controlled, Type 1, Oral Hypoglycemic Agents and Insulin Dependent  Renal/GU Renal InsufficiencyRenal diseaseS/P Renal Transplant 9 yrs ago     Musculoskeletal   Abdominal   Peds  Hematology   Anesthesia Other Findings   Reproductive/Obstetrics                           Anesthesia Physical Anesthesia Plan  ASA: III  Anesthesia Plan: General   Post-op Pain Management:    Induction: Intravenous, Rapid sequence and Cricoid pressure planned  Airway Management Planned: Oral ETT  Additional Equipment:   Intra-op Plan:   Post-operative Plan: Extubation in OR  Informed Consent: I have reviewed the patients History and Physical, chart, labs and discussed the procedure including the risks, benefits and alternatives for the proposed anesthesia with the patient or authorized representative who has indicated his/her understanding and acceptance.     Plan Discussed with: CRNA and Surgeon  Anesthesia Plan Comments:         Anesthesia Quick Evaluation

## 2013-01-15 NOTE — ED Notes (Signed)
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 Pt has an old av fistula in his left forearm.

## 2013-01-15 NOTE — ED Provider Notes (Signed)
Patient originally seen by Clayton Bibles, PA-C, and was transferred to [facility] for further evaluation.    15:55  Dr. Oneida Alar from vascular is bedside with the patient.  Labs are pending.  [place] is pending.  Anticipate surgery after labs and imaging.  Patient presents from med center with a chief complaint of left arm tightness.  Also with coldness and numbness.  The patient is a prior HD patient, but hasn't had dialysis since 2009 when he had a kidney transplant.  Patient's symptoms began yesterday.  The symptoms are worsened with arm movement.  Denies recent injury.  PE: Gen: A&O x4 HEENT: PERRL, EOM intact CHEST: RRR, no m/r/g, left hand cooler than right, radial pulse not palpable LUNGS: CTAB, no w/r/r ABD: BS x 4, ND/NT EXT: No edema,  NEURO: Sensation and strength intact bilaterally   Plan: Dr. Oneida Alar has seen the patient and will take him for surgery.   Montine Circle, PA-C 01/16/13 [phone]

## 2013-01-15 NOTE — Anesthesia Procedure Notes (Signed)
Procedure Name: Intubation Date/Time: 01/15/2013 5:30 PM Performed by: Eligha Bridegroom Pre-anesthesia Checklist: Patient identified, Timeout performed, Emergency Drugs available, Suction available and Patient being monitored Patient Re-evaluated:Patient Re-evaluated prior to inductionOxygen Delivery Method: Circle system utilized Preoxygenation: Pre-oxygenation with 100% oxygen Intubation Type: IV induction, Cricoid Pressure applied and Rapid sequence Laryngoscope Size: Mac and 4 Grade View: Grade I Tube type: Oral Tube size: 7.5 mm Number of attempts: 1 Airway Equipment and Method: Stylet Placement Confirmation: ETT inserted through vocal cords under direct vision,  breath sounds checked- equal and bilateral and positive ETCO2 Secured at: 22 cm Tube secured with: Tape Dental Injury: Teeth and Oropharynx as per pre-operative assessment

## 2013-01-15 NOTE — Progress Notes (Signed)
VASCULAR LAB PRELIMINARY  PRELIMINARY  PRELIMINARY  PRELIMINARY  Left upper extremity arterial duplex completed.    Preliminary report:  Occlusion in the distal brachial artery extending into the radial artery.  Eric Mccarty, RVT 01/15/2013, 4:34 PM

## 2013-01-15 NOTE — ED Notes (Signed)
Surgery called ready for patient.

## 2013-01-15 NOTE — Preoperative (Signed)
Beta Blockers   Reason not to administer Beta Blockers:Not Applicable 

## 2013-01-15 NOTE — ED Notes (Signed)
Upper left extremity duplex being performed at bedside currently.

## 2013-01-15 NOTE — ED Provider Notes (Signed)
CSN: [id number]     Arrival date & time 01/15/13  1320 History   First MD Initiated Contact with Patient 01/15/13 1338     Chief Complaint  Patient presents with  . Numbness   (Consider location/radiation/quality/duration/timing/severity/associated sxs/prior Treatment) The history is provided by the patient and the spouse.  Pt with hx left forearm AV fistula and AV graft not currently in use since 2009 kidney transplant p/w left arm tightness, slowness of left hand and wrist, coldness, and numbness.  This began yesterday while he was setting up a drum set. The symptoms are intermittent and positional.  When patient supinates his forearm, the symptoms disappear.  When he pronates or brings his arm up towards his body, the symptoms worsen.  Denies any injury to the area.  Denies fevers, chills, CP, SOB, leg swelling (beyond his chronic "normal" swelling)  Past Medical History  Diagnosis Date  . Renal disorder   . Hypertension   . Diabetes mellitus without complication   . Hyperlipidemia    Past Surgical History  Procedure Laterality Date  . Kidney transplant    . Combined kidney-pancreas transplant    . Av fistula placement    . Eye surgery    . Nephrectomy transplanted organ     No family history on file. History  Substance Use Topics  . Smoking status: Never Smoker   . Smokeless tobacco: Not on file  . Alcohol Use: No    Review of Systems  Constitutional: Negative for fever.  Respiratory: Negative for shortness of breath.   Cardiovascular: Negative for chest pain and leg swelling.  Skin: Negative for color change, pallor and wound.  Neurological: Positive for weakness and numbness.    Allergies  Review of patient's allergies indicates no known allergies.  Home Medications   Current Outpatient Rx  Name  Route  Sig  Dispense  Refill  . amitriptyline (ELAVIL) 25 MG tablet   Oral   Take 25 mg by mouth at bedtime.         Marland Kitchen aspirin 81 MG tablet   Oral   Take 81 mg  by mouth daily.         Marland Kitchen atorvastatin (LIPITOR) 10 MG tablet   Oral   Take 10 mg by mouth daily.         . calcitRIOL (ROCALTROL) 0.25 MCG capsule   Oral   Take 0.25 mcg by mouth daily.         . cloNIDine (CATAPRES) 0.3 MG tablet   Oral   Take 0.3 mg by mouth 3 (three) times daily.         . furosemide (LASIX) 20 MG tablet   Oral   Take 20 mg by mouth 2 (two) times daily.         Marland Kitchen glipiZIDE (GLUCOTROL) 10 MG tablet   Oral   Take 10 mg by mouth 2 (two) times daily before a meal.         . insulin glargine (LANTUS) 100 UNIT/ML injection   Subcutaneous   Inject 18 Units into the skin at bedtime.         Marland Kitchen labetalol (NORMODYNE) 200 MG tablet   Oral   Take 200 mg by mouth 2 (two) times daily.         . mycophenolate (CELLCEPT) 500 MG tablet   Oral   Take by mouth 3 (three) times daily.         . pioglitazone (ACTOS) 45 MG tablet  Oral   Take 45 mg by mouth daily.         . sennosides-docusate sodium (SENOKOT-S) 8.6-50 MG tablet   Oral   Take 1 tablet by mouth 2 (two) times daily.         . tacrolimus (PROGRAF) 1 MG capsule   Oral   Take 1 mg by mouth 2 (two) times daily.          BP 165/61  Pulse 74  Temp(Src) 99 F (37.2 C) (Oral)  Resp 18  Ht 5\' 6"  (1.676 m)  Wt 193 lb (87.544 kg)  BMI 31.17 kg/m2  SpO2 99% Physical Exam  Nursing note and vitals reviewed. Constitutional: He appears well-developed and well-nourished. No distress.  HENT:  Head: Normocephalic and atraumatic.  Neck: Neck supple.  Pulmonary/Chest: Effort normal.  Musculoskeletal:  Left forearm is nontender.  Scars presents from various remote surgical incisions - all well healed.  Brachial pulse is intact despite movement of arm.  Left radial pulse is present by doppler with arm supinated, disappears when he pronates.  Hand is cooler compared to right but sensation is intact.  Full AROM.  Capillary refill < 2 seconds.   Neurological: He is alert.  Skin: He is not  diaphoretic.  There is no palpable thrill at AV graft or fistula. There is a palpable brachial pulse that is not positional.   ED Course  Procedures (including critical care time) Labs Review Labs Reviewed - No data to display Imaging Review No results found.  EKG Interpretation   None      2:06 PM Pt discussed with Dr Karle Starch who is currently with the patient.  2:37 PM I have spoken with Dr Oneida Alar who recommends expeditious transfer to the [facility] ED so that he can see and examine the patient.  There is not an ambulance currently at this location and patient's wife is in ED and can drive patient directly to [facility].   MDM   1. AV fistula occlusion    Pt with AV graft and AV fistula not in use for several years p/w acute change involving tightness, numbness, coldness, weakness in his left hand and wrist, which is positional.  Radial pulse disappears with pronation.  Discussed pt with Dr Karle Starch and also with Dr Oneida Alar, who will see patient upon his arrival to [facility] ED.  I have explained the concern to the patient and have advised him to go directly to the [facility] ED without delay.  I spoke with Dr Maryan Rued, Urban Gibson MD at [facility], who is aware of the patient's impending arrival.  Charge RN made aware by nurse.  Dr Oneida Alar to be paged upon patient's arrival to ED.     Clayton Bibles, PA-C 01/15/13 1444

## 2013-01-15 NOTE — Op Note (Addendum)
Procedure: Brachial ulnar and radial artery embolectomy  Preoperative diagnosis: Acute ischemia left hand  Postoperative diagnosis: Same  Anesthesia: Gen.  Assistant: Wray Kearns PA-C  Operative findings: #1 acute embolus brachial bifurcation and distal radial and ulnar arteries  Specimens: Embolic material  Operative details: After obtaining informed consent, the patient was taken to the operating room. The patient was placed in supine position on the operating room table. After induction of general anesthesia and placement of an endotracheal tube, the patient's entire left upper extremity was prepped and draped in the usual sterile fashion. Next, a longitudinal incision was made on the left arm just above the level of the antecubital crease through a pre-existing scar from a prior left forearm AV graft. The incision was carried down through the subcutaneous tissues down to the level of the bicipital aponeurosis. To expose this area several centimeters of the venous limb of the graft was excised. This was ligated proximally and distally. This was taken down with cautery. The brachial artery was dissected free circumferentially. The previous arterial limb of the graft was dissected free circumferentially all the way down to the anastomosis. The artery was calcified in [place]. There was visible thrombus within the brachial artery. Dissection was carried down to the bifurcation of the brachial artery. The radial and ulnar arteries were dissected free circumferentially and vessel loops were placed around these.  He was given a bolus of 8000 units of intravenous heparin. After 2 minutes of circulation time, Vesseloops were used to control the proximal brachial artery as well as the radial and ulnar arteries. The old arterial anastomosis was taken down completely. The distal end of the graft was excised over several centimeters and then ligated. There was visible embolic material within the artery.  This was removed under direct vision. A #3 Fogarty catheter was then passed proximally up the brachial artery with multiple to make sure that all embolic material was removed and there was brisk arterial inflow. 2 clean passes were obtained. This was then reoccluded with a vessel loop. I then proceeded to pass a #3 Fogarty catheters selectively down the radial and ulnar arteries.  Small amounts of embolic material were removed from both of these. Catheters were passed down until the catheter was at least 40 cm and 2 clean passes were obtained. Each branch was then thoroughly flushed with heparinized saline. All embolic material was sent to pathology as specimen. The arteriotomy was then repaired using a bovine pericardial patch using a running 6-0 Prolene suture. At completion of the anastomosis everything was forebled backbled and thoroughly flushed. The anastomosis was secured.  Vesseloops were released and there was good radial Doppler flow and ulnar flow immediately. There was biphasic radial Doppler flow. There is monophasic to biphasic ulnar Doppler flow. There was good flow in the brachial artery. There was good Doppler flow in the proximal radial and ulnar arteries. The flow in the radial and ulnar arteries augmented 100% with unclamping of the artery. At this point hemostasis was obtained. Subcutaneous tissues were reapproximated using running 3-0 Vicryl suture. The skin was closed with a 4 0 Vicryl subcuticular stitch. Dermabond was applied. The patient tolerated the procedure well and there were no complications. Instrument sponge and needle counts were correct at the end of the case. Patient was taken to recovery in stable condition.  Ruta Hinds, MD [facility] Office: [phone] Pager: [phone]

## 2013-01-16 ENCOUNTER — Encounter (HOSPITAL_COMMUNITY): Payer: Self-pay | Admitting: Vascular Surgery

## 2013-01-16 DIAGNOSIS — I517 Cardiomegaly: Secondary | ICD-10-CM

## 2013-01-16 LAB — HEMOGLOBIN A1C
Hgb A1c MFr Bld: 7.2 % — ABNORMAL HIGH (ref <5.7)
Mean Plasma Glucose: 160 mg/dL — ABNORMAL HIGH (ref <117)

## 2013-01-16 LAB — GLUCOSE, CAPILLARY
Glucose-Capillary: 90 mg/dL (ref 70–99)
Glucose-Capillary: 188 mg/dL — ABNORMAL HIGH (ref 70–99)
Glucose-Capillary: 145 mg/dL — ABNORMAL HIGH (ref 70–99)

## 2013-01-16 LAB — BASIC METABOLIC PANEL
BUN: 39 mg/dL — ABNORMAL HIGH (ref 6–23)
CO2: 16 mEq/L — ABNORMAL LOW (ref 19–32)
Calcium: 8.8 mg/dL (ref 8.4–10.5)
Chloride: 106 mEq/L (ref 96–112)
Creatinine, Ser: 1.79 mg/dL — ABNORMAL HIGH (ref 0.50–1.35)
GFR calc non Af Amer: 41 mL/min — ABNORMAL LOW (ref >90)
Sodium: 135 mEq/L (ref 135–145)

## 2013-01-16 LAB — HEPARIN LEVEL (UNFRACTIONATED): Heparin Unfractionated: 0.59 IU/mL (ref 0.30–0.70)

## 2013-01-16 LAB — CBC
HCT: 35.7 % — ABNORMAL LOW (ref 39.0–52.0)
Hemoglobin: 11.6 g/dL — ABNORMAL LOW (ref 13.0–17.0)
MCH: 29.1 pg (ref 26.0–34.0)
MCH: 29.3 pg (ref 26.0–34.0)
MCHC: 32.2 g/dL (ref 30.0–36.0)
MCV: 90.2 fL (ref 78.0–100.0)
MCV: 90.2 fL (ref 78.0–100.0)
Platelets: 229 10*3/uL (ref 150–400)
Platelets: 201 10*3/uL (ref 150–400)
RBC: 3.96 MIL/uL — ABNORMAL LOW (ref 4.22–5.81)
RDW: 14 % (ref 11.5–15.5)
WBC: 10.9 10*3/uL — ABNORMAL HIGH (ref 4.0–10.5)
WBC: 10.1 10*3/uL (ref 4.0–10.5)

## 2013-01-16 LAB — MRSA PCR SCREENING: MRSA by PCR: NEGATIVE

## 2013-01-16 MED ORDER — COUMADIN BOOK
Freq: Once | Status: AC
  Administered 2013-01-16: 17:00:00
  Filled 2013-01-16: qty 1

## 2013-01-16 MED ORDER — WARFARIN VIDEO
Freq: Once | Status: AC
  Administered 2013-01-16: 17:00:00

## 2013-01-16 MED ORDER — WARFARIN - PHARMACIST DOSING INPATIENT
Freq: Every day | Status: DC
  Administered 2013-01-16: 19:00:00

## 2013-01-16 MED ORDER — WARFARIN SODIUM 7.5 MG PO TABS
7.5000 mg | ORAL_TABLET | Freq: Once | ORAL | Status: AC
  Administered 2013-01-16: 7.5 mg via ORAL
  Filled 2013-01-16: qty 1

## 2013-01-16 NOTE — Evaluation (Signed)
Physical Therapy Evaluation Patient Details Name: Eric Mccarty MRN: [id number] DOB: September 16, 1955 Today's Date: 01/16/2013 Time: [id number] PT Time Calculation (min): 8 min  PT Assessment / Plan / Recommendation History of Present Illness  Pt adm with Lt hand ischemia (s/p prior AV fistula when on HD, prior to kidney transplant). Underwent thrombectomy of brachial, radial, and ulnar arteries.  Clinical Impression  Patient evaluated by Physical Therapy with no further acute PT needs identified. All education has been completed and the patient has no further questions.  See below for any follow-up Physial Therapy or equipment needs. PT is signing off. Thank you for this referral.     PT Assessment  Patent does not need any further PT services    Follow Up Recommendations  No PT follow up    Does the patient have the potential to tolerate intense rehabilitation      Barriers to Discharge        Equipment Recommendations  None recommended by PT    Recommendations for Other Services OT consult (? approp hand exercises)   Frequency      Precautions / Restrictions Precautions Precautions: Other (comment) Precaution Comments: anticipated lifting/pulling limitations of LUE (not yet ordered)   Pertinent Vitals/Pain 0.5 out of 10 LUE incision; instructed in hand pumps to help with decreasing edema      Mobility  Bed Mobility Bed Mobility: Not assessed Transfers Transfers: Sit to Stand;Stand to Sit Sit to Stand: 7: Independent;Without upper extremity assist Stand to Sit: 7: Independent;Without upper extremity assist Ambulation/Gait Ambulation/Gait Assistance: 7: Independent Ambulation Distance (Feet): 250 Feet Assistive device: None Gait Pattern: Within Functional Limits Gait velocity: normal    Exercises     PT Diagnosis:    PT Problem List:   PT Treatment Interventions:       PT Goals(Current goals can be found in the care plan section) Acute Rehab PT Goals PT  Goal Formulation: No goals set, d/c therapy  Visit Information  Last PT Received On: 01/16/13 Assistance Needed: +1 History of Present Illness: Pt adm with Lt hand ischemia (s/p prior AV fistula when on HD, prior to kidney transplant). Underwent thrombectomy of brachial, radial, and ulnar arteries.       Prior [place] expects to be discharged to:: Private residence Living Arrangements: Spouse/significant other Available Help at Discharge: Family;Available 24 hours/day Type of Home: House (Townhouse) Home Access: Stairs to enter [employer] of Steps: 3 Entrance Stairs-Rails: Right Home Layout: Two level;Bed/bath upstairs Alternate Level Stairs-Number of Steps: 12 Alternate Level Stairs-Rails: Right Home Equipment: None Prior Function Level of Independence: Independent Comments: drummer in a band Communication Communication: No difficulties Dominant Hand: Right    Cognition  Cognition Arousal/Alertness: Awake/alert Behavior During Therapy: WFL for tasks assessed/performed Overall Cognitive Status: Within Functional Limits for tasks assessed    Extremity/Trunk Assessment Upper Extremity Assessment Upper Extremity Assessment: Defer to OT evaluation Lower Extremity Assessment Lower Extremity Assessment: Overall WFL for tasks assessed Cervical / Trunk Assessment Cervical / Trunk Assessment: Normal   Balance    End of Session PT - End of Session Activity Tolerance: Patient tolerated treatment well Patient left: in chair;with call bell/phone within reach Nurse Communication: Mobility status;Other (comment) (no PT needs)  GP     Eric Mccarty 01/16/2013, 10:19 AM Pager [phone]

## 2013-01-16 NOTE — Progress Notes (Signed)
UR Completed.  [name] 01/16/2013

## 2013-01-16 NOTE — ED Provider Notes (Signed)
History/physical exam/procedure(s) were performed by non-physician practitioner and as supervising physician I was immediately available for consultation/collaboration. I have reviewed all notes and am in agreement with care and plan.   Shaune Pollack, MD 01/16/13 [phone]

## 2013-01-16 NOTE — Progress Notes (Signed)
  Echocardiogram 2D Echocardiogram has been performed.  Eric Mccarty 01/16/2013, 8:51 AM

## 2013-01-16 NOTE — Progress Notes (Signed)
ANTICOAGULATION CONSULT NOTE - Follow Up Consult  Pharmacy Consult for Heparin Indication: s/p thromboembolectomy L-brachial artery  No Known Allergies  Patient Measurements: Height: 5' 6.75" (169.5 cm) Weight: 193 lb (87.544 kg) IBW/kg (Calculated) : 65.53 Heparin Dosing Weight: 84.5 kg  Vital Signs: Temp: 97.9 F (36.6 C) (10/21 1140) Temp src: Oral (10/21 1140) BP: 192/69 mmHg (10/21 1140) Pulse Rate: 69 (10/21 1140)  Labs:  Recent Labs  01/15/13 1641 01/16/13 0110 01/16/13 0425 01/16/13 1200  HGB 12.5* 11.9* 11.6*  --   HCT 39.2 36.9* 35.7*  --   PLT 245 229 201  --   APTT 31  --   --   --   LABPROT 13.1  --   --   --   INR 1.01  --   --   --   HEPARINUNFRC  --   --  0.61 0.59  CREATININE 1.98* 1.79*  --   --     Estimated Creatinine Clearance: 49 ml/min (by C-G formula based on Cr of 1.79).   Medications:  -Heparin 1200 units/hr  Assessment: 57 y/o M on heparin with L brachial embolus s/p thromboembolectomy and at goal (HL= 0.59) on heparin . Patient to begin coumadin today (baseline INR  1.01 on 10/20)   Goal of Therapy:  Heparin level 0.3-0.7 units/ml Monitor platelets by anticoagulation protocol: Yes   Plan:  -No heparin changes needed -Daily heparin level and CBC -Coumadin 7.5mg  -Daily PT/INR -Will begin education process  Hildred Laser, Pharm D 01/16/2013 1:45 PM

## 2013-01-16 NOTE — Plan of Care (Signed)
Problem: Phase II Progression Outcomes Goal: Discharge plan established Outcome: Completed/Met Date Met:  01/16/13 Home with wife

## 2013-01-16 NOTE — ED Provider Notes (Signed)
Medical screening examination/treatment/procedure(s) were performed by non-physician practitioner and as supervising physician I was immediately available for consultation/collaboration.   Charles B. Karle Starch, MD 01/16/13 [phone]

## 2013-01-16 NOTE — Progress Notes (Signed)
ANTICOAGULATION CONSULT NOTE - Follow Up Consult  Pharmacy Consult for Heparin Indication: s/p thromboembolectomy L-brachial artery  No Known Allergies  Patient Measurements: Height: 5' 6.75" (169.5 cm) Weight: 193 lb (87.544 kg) IBW/kg (Calculated) : 65.53 Heparin Dosing Weight: 84.5 kg  Vital Signs: Temp: 97.6 F (36.4 C) (10/20 2100) Temp src: Oral (10/20 2100) BP: 144/66 mmHg (10/21 0500) Pulse Rate: 61 (10/21 0500)  Labs:  Recent Labs  01/15/13 1641 01/16/13 0110 01/16/13 0425  HGB 12.5* 11.9* 11.6*  HCT 39.2 36.9* 35.7*  PLT 245 229 201  APTT 31  --   --   LABPROT 13.1  --   --   INR 1.01  --   --   HEPARINUNFRC  --   --  0.61  CREATININE 1.98* 1.79*  --     Estimated Creatinine Clearance: 49 ml/min (by C-G formula based on Cr of 1.79).   Medications:  -Heparin 1200 units/hr  Assessment: 57 y/o M on heparin s/p thromboembolectomy. Hgb 11.6, Plts 201, Scr 1.79 with CrCl ~ 50, no overt bleeding noted. First HL is 0.61---will check 6 hour HL to monitor for any accumulation.   Goal of Therapy:  Heparin level 0.3-0.7 units/ml Monitor platelets by anticoagulation protocol: Yes   Plan:  -Continue heparin at 1200 units/hr -6 hour HL at 1130 -Daily CBC/HL -Monitor for bleeding  Thank you for allowing me to take part in this patient's care,  Narda Bonds, PharmD Clinical Pharmacist Phone: [phone] Pager: [phone] 01/16/2013 5:23 AM

## 2013-01-16 NOTE — Evaluation (Signed)
Occupational Therapy Evaluation Patient Details Name: Eric Mccarty MRN: [id number] DOB: 1956-01-10 Today's Date: 01/16/2013 Time: [id number] OT Time Calculation (min): 35 min  OT Assessment / Plan / Recommendation History of present illness Pt adm with Lt hand ischemia (s/p prior AV fistula when on HD, prior to kidney transplant). Underwent thrombectomy of brachial, radial, and ulnar arteries.   Clinical Impression   Patient evaluated by Occupational Therapy with no further acute OT needs identified. All education has been completed and the patient has no further questions. Pt with minimal limitations in AROM Lt elbow flex/ext; and wrist.  HEP provided to pt and he demonstrate independence.  Pt instructed in precautions. See below for any follow-up Occupational Therapy or equipment needs. OT is signing off. Thank you for this referral.     OT Assessment  Patient does not need any further OT services    Follow Up Recommendations  No OT follow up;Supervision - Intermittent    Barriers to Discharge      Equipment Recommendations  None recommended by OT    Recommendations for Other Services    Frequency       Precautions / Restrictions Precautions Precautions: Other (comment) Precaution Comments: anticipated lifting/pulling limitations of LUE (not yet ordered)   Pertinent Vitals/Pain     ADL  Eating/Feeding: Modified independent Where Assessed - Eating/Feeding: Chair Grooming: Wash/dry face;Wash/dry hands;Teeth care;Brushing hair;Set up Where Assessed - Grooming: Supported sitting Upper Body Bathing: Set up Where Assessed - Upper Body Bathing: Supported sitting;Unsupported sitting Lower Body Bathing: Supervision/safety Where Assessed - Lower Body Bathing: Supported sit to stand Upper Body Dressing: Set up Where Assessed - Upper Body Dressing: Unsupported sitting Lower Body Dressing: Supervision/safety Where Assessed - Lower Body Dressing: Supported sit to  stand;Unsupported sit to stand Toilet Transfer: Supervision/safety Armed forces technical officer Method: Sit to Loss adjuster, chartered: Regular height toilet Toileting - Clothing Manipulation and Hygiene: Supervision/safety Where Assessed - Toileting Clothing Manipulation and Hygiene: Standing ADL Comments: Pt instructed in HEP.  Performed below exercises.  Pt instructed on precautions - no pulling, pushing, no lifting with Lt. UE.  Progress activity per MD instruction.  Reinforced need to continue to perform HEP - pt motivated and should do well.     OT Diagnosis:    OT Problem List:   OT Treatment Interventions:     OT Goals(Current goals can be found in the care plan section)    Visit Information  Last OT Received On: 01/16/13 Assistance Needed: +1 History of Present Illness: Pt adm with Lt hand ischemia (s/p prior AV fistula when on HD, prior to kidney transplant). Underwent thrombectomy of brachial, radial, and ulnar arteries.       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 Communication: No difficulties Dominant Hand: Right         Vision/Perception     Cognition  Cognition Arousal/Alertness: Awake/alert Behavior During Therapy: WFL for tasks assessed/performed Overall Cognitive Status: Within Functional Limits for tasks assessed    Extremity/Trunk Assessment Upper Extremity Assessment Upper  Extremity Assessment: LUE deficits/detail LUE  Deficits / Details: Shoulder WFL:  Elbow AROM flex ~120*; ext ~-20; AROM wrist ext ~20*; flex WFL;  Fingers WFL strength 3+/5 LUE Sensation: decreased light touch (ulnar aspect (mild)) LUE Coordination: decreased fine motor;decreased gross motor Lower Extremity Assessment Lower Extremity Assessment: Defer to PT evaluation     Mobility       Exercise General Exercises - Upper Extremity Elbow Flexion: AROM;Left;10 reps;Seated Elbow Extension: AROM;Left;10 reps;Seated Wrist Flexion: AROM;Left;10 reps;Seated Wrist Extension: AROM;Left;10 reps;Seated Digit Composite Flexion: AROM;10 reps;Left;Seated Composite Extension: AROM;Left;10 reps;Seated Hand Exercises Forearm Supination: AROM;Left;10 reps;Seated Forearm Pronation: AROM;Left;10 reps;Seated Other Exercises Other Exercises: Pt provided with [place] and finger strenghtener - yellow and demonstrated independence with use   Balance     End of Session OT - End of Session Activity Tolerance: Patient tolerated treatment well Patient left: in chair;with call bell/phone within reach;with family/visitor present  Hudson, Ellard Artis M 01/16/2013, 6:28 PM

## 2013-01-16 NOTE — Progress Notes (Signed)
[facility] [place]  Subjective  - hand feels better   Objective 146/67 58 97.6 F (36.4 C) (Oral) 14 93%  Intake/Output Summary (Last 24 hours) at 01/16/13 0759 Last data filed at 01/16/13 0700  Gross per 24 hour  Intake 1855.8 ml  Output    450 ml  Net 1405.8 ml   No hematoma, 2+ radial pulse left hand, mild edema  Assessment/Planning: Continue heparin Consider coumadin ECHO today Transfer 2W  FIELDS,CHARLES E 01/16/2013 7:59 AM --  Laboratory Lab Results:  Recent Labs  01/16/13 0110 01/16/13 0425  WBC 10.9* 10.1  HGB 11.9* 11.6*  HCT 36.9* 35.7*  PLT 229 201   BMET  Recent Labs  01/15/13 1641 01/16/13 0110  NA 133* 135  K 4.2 4.9  CL 97 106  CO2 24 16*  GLUCOSE 214* 126*  BUN 43* 39*  CREATININE 1.98* 1.79*  CALCIUM 9.5 8.8    COAG Lab Results  Component Value Date   INR 1.01 01/15/2013   INR 1.01 07/13/2009   No results found for this basename: PTT

## 2013-01-17 LAB — CBC
Hemoglobin: 11.3 g/dL — ABNORMAL LOW (ref 13.0–17.0)
MCH: 28.9 pg (ref 26.0–34.0)
MCV: 91.6 fL (ref 78.0–100.0)
Platelets: 198 10*3/uL (ref 150–400)
RBC: 3.91 MIL/uL — ABNORMAL LOW (ref 4.22–5.81)
RDW: 14.1 % (ref 11.5–15.5)
WBC: 7.7 10*3/uL (ref 4.0–10.5)

## 2013-01-17 LAB — GLUCOSE, CAPILLARY
Glucose-Capillary: 111 mg/dL — ABNORMAL HIGH (ref 70–99)
Glucose-Capillary: 161 mg/dL — ABNORMAL HIGH (ref 70–99)

## 2013-01-17 LAB — HEPARIN LEVEL (UNFRACTIONATED): Heparin Unfractionated: 0.26 IU/mL — ABNORMAL LOW (ref 0.30–0.70)

## 2013-01-17 MED ORDER — WARFARIN SODIUM 7.5 MG PO TABS
7.5000 mg | ORAL_TABLET | Freq: Once | ORAL | Status: DC
  Filled 2013-01-17: qty 1

## 2013-01-17 MED ORDER — WARFARIN SODIUM 5 MG PO TABS: 5.0000 mg | ORAL_TABLET | Freq: Every day | ORAL | Status: DC

## 2013-01-17 MED ORDER — OXYCODONE HCL 5 MG PO TABS: 5.0000 mg | ORAL_TABLET | ORAL | Status: DC | PRN

## 2013-01-17 MED ORDER — ENOXAPARIN SODIUM 150 MG/ML ~~LOC~~ SOLN
135.0000 mg | SUBCUTANEOUS | Status: DC
  Administered 2013-01-17: 135 mg via SUBCUTANEOUS
  Filled 2013-01-17: qty 1

## 2013-01-17 MED ORDER — ENOXAPARIN SODIUM 150 MG/ML ~~LOC~~ SOLN: 135.0000 mg | SUBCUTANEOUS | Status: DC

## 2013-01-17 NOTE — Progress Notes (Signed)
[facility]   Mr. Eric Mccarty had surgery  01/15/2013 by Dr. Ruta Hinds.  He will be out of work 2-3 weeks until he sees Dr. Oneida Alar in the office for a follow up visit in approximately 2 weeks.     Restrictions: No lifting greater than 5 lbs. For 6 weeks.     Benton Tooker MAUREEN PA-C

## 2013-01-17 NOTE — Progress Notes (Signed)
ANTICOAGULATION CONSULT NOTE - Follow Up Consult  Pharmacy Consult for Hepari->Lovenox; Coumadin Indication: s/p thromboembolectomy L-brachial artery  No Known Allergies  Patient Measurements: Height: 5' 6.75" (169.5 cm) Weight: 196 lb 3.4 oz (89 kg) IBW/kg (Calculated) : 65.53 Heparin Dosing Weight: 84.5 kg  Vital Signs: BP: 195/80 mmHg (10/22 0641) Pulse Rate: 67 (10/22 0641)  Labs:  Recent Labs  01/15/13 1641 01/16/13 0110 01/16/13 0425 01/16/13 1200 01/17/13 0535  HGB 12.5* 11.9* 11.6*  --  11.3*  HCT 39.2 36.9* 35.7*  --  35.8*  PLT 245 229 201  --  198  APTT 31  --   --   --   --   LABPROT 13.1  --   --   --  14.5  INR 1.01  --   --   --  1.15  HEPARINUNFRC  --   --  0.61 0.59 0.26*  CREATININE 1.98* 1.79*  --   --   --     Estimated Creatinine Clearance: 49.4 ml/min (by C-G formula based on Cr of 1.79).   Medications:  -Heparin 1200 units/hr  Assessment: 57 y/o M on heparin to coumadin bridge with L brachial embolus s/p thromboembolectomy. HL 0.26 (slightly subtherapeutic) this a.m. INR 1.15 - remains subtherapeutic past first dose yesterday but moving nicely. This is Day #2/5 minimum overlap. Noted plan to send pt home on lovenox to coumadin bridge for at least 3 more days.   Goal of Therapy:  Heparin level 0.3-0.7 units/ml, Anti-Xa level 0.6 -1 mcg/ml (4 hr post Lovenox dose), INR 2-3 Monitor platelets by anticoagulation protocol: Yes   Plan:  - D/c heparin - Lovenox 135 mg SQ q24h (1.5 mg/kg) - first dose 1 hr past heparin gtt off. - Coumadin 7.5mg  again today. At d/c, consider giving 5mg  tablets #60, take 1.5 tablets (7.5mg ) daily with INR f/u in 2-3 days -Daily PT/INR  Sherlon Handing, PharmD, BCPS Clinical pharmacist, pager [phone]  01/17/2013 8:29 AM

## 2013-01-17 NOTE — Progress Notes (Addendum)
[facility] [place]  Subjective  - "My hand is swollen and I'M having a little trouble straightening my elbow due to the incision."   Objective 195/80 67 98.3 F (36.8 C) (Oral) 18 98%  Intake/Output Summary (Last 24 hours) at 01/17/13 0735 Last data filed at 01/17/13 0600  Gross per 24 hour  Intake   2625 ml  Output   1500 ml  Net   1125 ml   2 D ECHO: Impressions:  - No significant heart disease.  PE: Left arm and hand min. Swelling Incision clean and dry -10 degrees full extension of left elbow Heart RRR   Assessment/Planning: Procedure(s): EMBOLECTOMY Left Brachial Artery With Patch Angioplasty  2 Days Post-OpSurgeon(s): Elam Dutch, MD  Echo normal Coumadin started 01/16/2013 Heparin until D/C Will have pharmacy dose Lovenox for home bridging to coumadin, INR goal 2.0-3.0 F/U in 2 weeks Out of work until f/u visit in office, no lifting greater than 5 lbs for 4-6 weeks. We need to know who will follow his INR he sees Dr. Moshe Cipro for all his medical care right now.   Laurence Slate [facility] 01/17/2013 7:35 AM --  Laboratory Lab Results:  Recent Labs  01/16/13 0425 01/17/13 0535  WBC 10.1 7.7  HGB 11.6* 11.3*  HCT 35.7* 35.8*  PLT 201 198   BMET  Recent Labs  01/15/13 1641 01/16/13 0110  NA 133* 135  K 4.2 4.9  CL 97 106  CO2 24 16*  GLUCOSE 214* 126*  BUN 43* 39*  CREATININE 1.98* 1.79*  CALCIUM 9.5 8.8    COAG Lab Results  Component Value Date   INR 1.15 01/17/2013   INR 1.01 01/15/2013   INR 1.01 07/13/2009   No results found for this basename: PTT      Dr. Moshe Cipro will follow PT/INR as an out patient. Skylynn Burkley MAUREEN PA-C

## 2013-01-17 NOTE — Care Management Note (Signed)
    Page 1 of 1   01/17/2013     1:17:21 PM   CARE MANAGEMENT NOTE 01/17/2013  Patient:  Eric Mccarty, Eric Mccarty   Account Number:  [id number]  Date Initiated:  01/16/2013  Documentation initiated by:  Luz Lex  Subjective/Objective Assessment:   Brachial ulnar and radial artery embolectomy     Action/Plan:   Anticipated DC Date:  01/17/2013   Anticipated DC Plan:  [place]  CM consult  Medication Assistance      Choice offered to / List presented to:             Status of service:  Completed, signed off Medicare Important Message given?   (If response is "NO", the following Medicare IM given date fields will be blank) Date Medicare IM given:   Date Additional Medicare IM given:    Discharge Disposition:  HOME/SELF CARE  Per UR Regulation:  Reviewed for med. necessity/level of care/duration of stay  If discussed at [place] of Stay Meetings, dates discussed:    Comments:  Contact:  Shedd,Carolyn Spouse [phone]  01/17/13 Ikenna Ohms,RN,BSN [id number] CHECKED COVERAGE FOR GENERIC LOVENOX: $10.00 copay / no prior auth required  PT MADE AWARE OF MEDICATION COVERAGE.

## 2013-01-18 NOTE — Discharge Summary (Signed)
[facility] Discharge Summary   Patient ID:  Eric Mccarty MRN: [id number] DOB/AGE: 05/09/1955 57 y.o.  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 Renal disorder   . Hypertension   . Diabetes mellitus without complication   . Hyperlipidemia     Procedure(s): EMBOLECTOMY Left Brachial Artery With Patch Angioplasty  Discharged Condition: good  HPI: Patient is a 57 y.o. year old male who presents for evaluation of left hand ischemia. 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 The numbness and tingling is now intermittent but he still has early fatigue of his left hand and forearm. No prior events. Pt with prior left radial cephalic fistula and left forearm graft for hemodialysis. 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 Baseline creatinine is just under 2. He denies chest pain or shortness of breath. He denies prior history of cardiac arrhythmia such as af fib. Other medical problems include hypertension, diabetes and hyperlipidemia all of which are currently controlled. Has been NPO since 1230 pm today.  Brachial ulnar and radial artery embolectomy were done 01/15/2013 By Dr Ruta Hinds.  Post-op he was placed on Heparin via consult from pharmacy.  He was started on coumadin was started on 01/16/2013.  He has palpable radial pulses equal bilateral and the incision is clean and dry.   He will be discharged home on coumadin 5 mg daily and Lovenox SQ 135 OD.  Dr. Princella Pellegrini will follow his INR blood work and dose his coumadin with an INR goal of 2.0-3.0.  He will  need to be on coumadin for 6 months unless otherwise changed.  A 2D Echo was performed and showed normal heart function.      Hospital Course:  Eric Mccarty is a 57 y.o. male is S/P Left Procedure(s): EMBOLECTOMY Left Brachial Artery With Patch Angioplasty Extubated: POD # 0 Physical exam: Left arm and hand min. Swelling  Incision clean and dry  -10 degrees full extension of left elbow  Heart RRR Pt. Ambulating, voiding and taking PO diet without difficulty. Pt pain controlled with PO pain meds. Labs as below Complications:none  Consults: Pharmacy see hospital course    Significant Diagnostic Studies: CBC Lab Results  Component Value Date   WBC 7.7 01/17/2013   HGB 11.3* 01/17/2013   HCT 35.8* 01/17/2013   MCV 91.6 01/17/2013   PLT 198 01/17/2013    BMET    Component Value Date/Time   NA 135 01/16/2013 0110   K 4.9 01/16/2013 0110   CL 106 01/16/2013 0110   CO2 16* 01/16/2013 0110   GLUCOSE 126* 01/16/2013 0110   BUN 39* 01/16/2013 0110   CREATININE 1.79* 01/16/2013 0110   CALCIUM 8.8 01/16/2013 0110   GFRNONAA 41* 01/16/2013 0110   GFRAA 47* 01/16/2013 0110   COAG Lab Results  Component Value Date   INR 1.15 01/17/2013   INR 1.01 01/15/2013   INR 1.01 07/13/2009     Disposition: Stable Discharge to :Home Discharge Orders   Future Orders Complete By Expires   Call MD for:  redness, tenderness, or signs of infection (pain, swelling, bleeding, redness, odor or green/yellow discharge around incision site)  As directed    Call MD for:  severe or increased pain, loss or decreased feeling  in affected limb(s)  As directed    Call MD for:  temperature >100.5  As directed    Discharge instructions  As directed    Comments:     You may shower.  Slowly increase your activity.   Driving Restrictions  As directed    Comments:     No driving for 24 hours   Lifting restrictions  As directed    Comments:     No lifting for 8 weeks   Resume previous diet  As  directed        Medication List         amitriptyline 25 MG tablet  Commonly known as:  ELAVIL  Take 25 mg by mouth at bedtime.     aspirin 81 MG tablet  Take 81 mg by mouth daily.     atorvastatin 10 MG tablet  Commonly known as:  LIPITOR  Take 10 mg by mouth daily.     calcitRIOL 0.25 MCG capsule  Commonly known as:  ROCALTROL  Take 0.25 mcg by mouth daily.     cloNIDine 0.3 MG tablet  Commonly known as:  CATAPRES  Take 0.3 mg by mouth 3 (three) times daily.     enoxaparin 150 MG/ML injection  Commonly known as:  LOVENOX  Inject 0.9 mLs (135 mg total) into the skin daily.     furosemide 20 MG tablet  Commonly known as:  LASIX  Take 20 mg by mouth 2 (two) times daily.     glipiZIDE 10 MG tablet  Commonly known as:  GLUCOTROL  Take 10 mg by mouth 2 (two) times daily before a meal.     insulin glargine 100 UNIT/ML injection  Commonly known as:  LANTUS  Inject 18 Units into the skin at bedtime.     labetalol 200 MG tablet  Commonly known as:  NORMODYNE  Take 200-400 mg by mouth 2 (two) times daily. Take 1 tablet in the morning and take 2 tablets in the evening     mycophenolate 500 MG tablet  Commonly known as:  CELLCEPT  Take by mouth 3 (three) times daily.     oxyCODONE 5 MG immediate release tablet  Commonly known as:  Oxy IR/ROXICODONE  Take 1 tablet (5 mg total) by mouth every 4 (four) hours as needed for pain.     pioglitazone 45 MG tablet  Commonly known as:  ACTOS  Take 45 mg by mouth daily.     sennosides-docusate sodium 8.6-50 MG tablet  Commonly known as:  SENOKOT-S  Take 1 tablet by mouth 2 (two) times daily.     tacrolimus 1 MG capsule  Commonly known as:  PROGRAF  Take 2 mg by mouth 2 (two) times daily.     warfarin 5 MG tablet  Commonly known as:  COUMADIN  Take 1 tablet (5 mg total) by mouth daily.       Verbal and written Discharge instructions given to the patient. Wound care per Discharge AVS     Follow-up Information    Follow up with Elam Dutch, MD In 2 weeks. (sent)    Specialty:  Vascular Surgery   Contact information:   [address] [place] [address] [phone]       Follow up with GOLDSBOROUGH,KELLIE A, MD. Call in 1 day. (for lab work PT/INR)    Specialty:  Nephrology   Contact  information:   [place] [address] [phone]       Signed: Laurence Slate [facility] 01/18/2013, 10:44 AM

## 2013-01-22 DIAGNOSIS — Z0279 Encounter for issue of other medical certificate: Secondary | ICD-10-CM

## 2013-01-31 ENCOUNTER — Encounter: Payer: Self-pay | Admitting: Vascular Surgery

## 2013-02-01 ENCOUNTER — Ambulatory Visit (INDEPENDENT_AMBULATORY_CARE_PROVIDER_SITE_OTHER): Payer: BC Managed Care – PPO | Admitting: Vascular Surgery

## 2013-02-01 ENCOUNTER — Encounter: Payer: Self-pay | Admitting: Vascular Surgery

## 2013-02-01 VITALS — BP 193/76 | HR 71 | Ht 66.0 in | Wt 196.2 lb

## 2013-02-01 DIAGNOSIS — I998 Other disorder of circulatory system: Secondary | ICD-10-CM | POA: Insufficient documentation

## 2013-02-01 DIAGNOSIS — I999 Unspecified disorder of circulatory system: Secondary | ICD-10-CM

## 2013-02-01 MED ORDER — HYDROGEL GEL: 1.0000 "application " | Freq: Once | Status: DC

## 2013-02-01 NOTE — Progress Notes (Addendum)
Patient is a 57 year old male who is approximately 2 weeks status post left brachial embolectomy. The patient has had some serous drainage from the incision. Left hand has returned to baseline with sensation and motor function completely intact. He is on Coumadin.  Current Outpatient Prescriptions on File Prior to Visit  Medication Sig Dispense Refill  . amitriptyline (ELAVIL) 25 MG tablet Take 25 mg by mouth at bedtime.      Marland Kitchen aspirin 81 MG tablet Take 81 mg by mouth daily.      Marland Kitchen atorvastatin (LIPITOR) 10 MG tablet Take 10 mg by mouth daily.      . calcitRIOL (ROCALTROL) 0.25 MCG capsule Take 0.25 mcg by mouth daily.      . cloNIDine (CATAPRES) 0.3 MG tablet Take 0.3 mg by mouth 3 (three) times daily.      . furosemide (LASIX) 20 MG tablet Take 20 mg by mouth 2 (two) times daily.      Marland Kitchen glipiZIDE (GLUCOTROL) 10 MG tablet Take 10 mg by mouth 2 (two) times daily before a meal.      . insulin glargine (LANTUS) 100 UNIT/ML injection Inject 18 Units into the skin at bedtime.      Marland Kitchen labetalol (NORMODYNE) 200 MG tablet Take 200-400 mg by mouth 2 (two) times daily. Take 1 tablet in the morning and take 2 tablets in the evening      . mycophenolate (CELLCEPT) 500 MG tablet Take by mouth 3 (three) times daily.      Marland Kitchen oxyCODONE (OXY IR/ROXICODONE) 5 MG immediate release tablet Take 1 tablet (5 mg total) by mouth every 4 (four) hours as needed for pain.  30 tablet  0  . pioglitazone (ACTOS) 45 MG tablet Take 45 mg by mouth daily.      . sennosides-docusate sodium (SENOKOT-S) 8.6-50 MG tablet Take 1 tablet by mouth 2 (two) times daily.      . tacrolimus (PROGRAF) 1 MG capsule Take 2 mg by mouth 2 (two) times daily.      Marland Kitchen enoxaparin (LOVENOX) 150 MG/ML injection Inject 0.9 mLs (135 mg total) into the skin daily.  4 Syringe  1   No current facility-administered medications on file prior to visit.     Physical exam:  Filed Vitals:   02/01/13 0928  BP: 193/76  Pulse: 71  Height: 5\' 6"  (1.676 m)   Weight: 196 lb 3.2 oz (88.996 kg)  SpO2: 100%    Left upper extremity: Healing incision except for the distal half which has a large eschar on it which I removed today. There has been some skin separation below this. Overall the wound is clean without erythema or fluctuance. No hematoma. 2+ radial pulse regular rhythm no evidence of atrial fibrillation  Assessment/plan: Slowly healing left antecubital incision no evidence of infection currently. Since the patient is on immunosuppression we will have him followup for wound check again in 2 weeks. Continue anticoagulation for at least 3 months total. If no evidence of atrial fibrillation or cardiac arrhythmia at that point probably consider stopping it. Local wound care to antecubital incision hydrogel once daily. Patient was given a note for work today no lifting left upper extremity for at least a month  Ruta Hinds, MD [facility] [place]: [phone] Pager: [phone]

## 2013-02-08 ENCOUNTER — Encounter: Payer: BC Managed Care – PPO | Admitting: Vascular Surgery

## 2013-02-14 ENCOUNTER — Encounter: Payer: Self-pay | Admitting: Vascular Surgery

## 2013-02-15 ENCOUNTER — Ambulatory Visit (INDEPENDENT_AMBULATORY_CARE_PROVIDER_SITE_OTHER): Payer: Self-pay | Admitting: Vascular Surgery

## 2013-02-15 ENCOUNTER — Encounter (INDEPENDENT_AMBULATORY_CARE_PROVIDER_SITE_OTHER): Payer: Self-pay

## 2013-02-15 ENCOUNTER — Encounter: Payer: Self-pay | Admitting: Vascular Surgery

## 2013-02-15 VITALS — BP 169/71 | HR 68 | Ht 66.0 in | Wt 200.0 lb

## 2013-02-15 DIAGNOSIS — I999 Unspecified disorder of circulatory system: Secondary | ICD-10-CM

## 2013-02-15 DIAGNOSIS — I998 Other disorder of circulatory system: Secondary | ICD-10-CM

## 2013-02-15 NOTE — Progress Notes (Signed)
Patient is a 57 year old male who is approximately 4 weeks status post left brachial embolectomy. The patient has less serous drainage from the incision. Left hand has returned to baseline with sensation and motor function completely intact. He is on Coumadin.    Current Outpatient Prescriptions on File Prior to Visit   Medication  Sig  Dispense  Refill   .  amitriptyline (ELAVIL) 25 MG tablet  Take 25 mg by mouth at bedtime.         Marland Kitchen  aspirin 81 MG tablet  Take 81 mg by mouth daily.         Marland Kitchen  atorvastatin (LIPITOR) 10 MG tablet  Take 10 mg by mouth daily.         .  calcitRIOL (ROCALTROL) 0.25 MCG capsule  Take 0.25 mcg by mouth daily.         .  cloNIDine (CATAPRES) 0.3 MG tablet  Take 0.3 mg by mouth 3 (three) times daily.         .  furosemide (LASIX) 20 MG tablet  Take 20 mg by mouth 2 (two) times daily.         Marland Kitchen  glipiZIDE (GLUCOTROL) 10 MG tablet  Take 10 mg by mouth 2 (two) times daily before a meal.         .  insulin glargine (LANTUS) 100 UNIT/ML injection  Inject 18 Units into the skin at bedtime.         Marland Kitchen  labetalol (NORMODYNE) 200 MG tablet  Take 200-400 mg by mouth 2 (two) times daily. Take 1 tablet in the morning and take 2 tablets in the evening         .  mycophenolate (CELLCEPT) 500 MG tablet  Take by mouth 3 (three) times daily.         Marland Kitchen  oxyCODONE (OXY IR/ROXICODONE) 5 MG immediate release tablet  Take 1 tablet (5 mg total) by mouth every 4 (four) hours as needed for pain.   30 tablet   0   .  pioglitazone (ACTOS) 45 MG tablet  Take 45 mg by mouth daily.         .  sennosides-docusate sodium (SENOKOT-S) 8.6-50 MG tablet  Take 1 tablet by mouth 2 (two) times daily.         .  tacrolimus (PROGRAF) 1 MG capsule  Take 2 mg by mouth 2 (two) times daily.         Marland Kitchen  enoxaparin (LOVENOX) 150 MG/ML injection  Inject 0.9 mLs (135 mg total) into the skin daily.   4 Syringe   1      No current facility-administered medications on file prior to visit.      Physical exam:      Filed Vitals:   02/15/13 1506  BP: 169/71  Pulse: 68  Height: 5\' 6"  (1.676 m)  Weight: 200 lb (90.719 kg)  SpO2: 98%   Left upper extremity: Healing incision except for central 2 cm a large eschar on it.  Overall the wound is clean.  No hematoma. 2+ radial pulse regular rhythm no evidence of atrial fibrillation  Assessment/plan: Slowly healing left antecubital incision no evidence of infection currently. Since the patient is on immunosuppression we will have him followup for wound check again in 2 weeks. Continue anticoagulation for at least 2 more months total. If no evidence of atrial fibrillation or cardiac arrhythmia at that point probably consider stopping it. Local wound care dry dressing once daily.  No heavy lifting left arm until incision completely healed. She will followup on as-needed basis at this point. If the wound is not completely healed in 4-6 weeks he will come back to see me.  Ruta Hinds, MD [facility] [place] Office: [phone] Pager: [phone]

## 2013-03-08 ENCOUNTER — Encounter: Payer: Self-pay | Admitting: Nephrology

## 2014-09-04 ENCOUNTER — Encounter (HOSPITAL_COMMUNITY): Payer: Self-pay | Admitting: *Deleted

## 2014-09-04 ENCOUNTER — Emergency Department (HOSPITAL_COMMUNITY)
Admission: EM | Admit: 2014-09-04 | Discharge: 2014-09-04 | Disposition: A | Discharge status: Home / Self Care | Payer: BLUE CROSS/BLUE SHIELD | Attending: Emergency Medicine | Admitting: Emergency Medicine

## 2014-09-04 DIAGNOSIS — Z87448 Personal history of other diseases of urinary system: Secondary | ICD-10-CM | POA: Insufficient documentation

## 2014-09-04 DIAGNOSIS — Z7982 Long term (current) use of aspirin: Secondary | ICD-10-CM | POA: Diagnosis not present

## 2014-09-04 DIAGNOSIS — Y9289 Other specified places as the place of occurrence of the external cause: Secondary | ICD-10-CM | POA: Diagnosis not present

## 2014-09-04 DIAGNOSIS — S40022A Contusion of left upper arm, initial encounter: Secondary | ICD-10-CM | POA: Insufficient documentation

## 2014-09-04 DIAGNOSIS — Z794 Long term (current) use of insulin: Secondary | ICD-10-CM | POA: Insufficient documentation

## 2014-09-04 DIAGNOSIS — Y9389 Activity, other specified: Secondary | ICD-10-CM | POA: Diagnosis not present

## 2014-09-04 DIAGNOSIS — Z79899 Other long term (current) drug therapy: Secondary | ICD-10-CM | POA: Insufficient documentation

## 2014-09-04 DIAGNOSIS — E785 Hyperlipidemia, unspecified: Secondary | ICD-10-CM | POA: Diagnosis not present

## 2014-09-04 DIAGNOSIS — W228XXA Striking against or struck by other objects, initial encounter: Secondary | ICD-10-CM | POA: Diagnosis not present

## 2014-09-04 DIAGNOSIS — E119 Type 2 diabetes mellitus without complications: Secondary | ICD-10-CM | POA: Insufficient documentation

## 2014-09-04 DIAGNOSIS — Y998 Other external cause status: Secondary | ICD-10-CM | POA: Insufficient documentation

## 2014-09-04 DIAGNOSIS — I10 Essential (primary) hypertension: Secondary | ICD-10-CM | POA: Diagnosis not present

## 2014-09-04 MED ORDER — SODIUM CHLORIDE 0.9 % IV BOLUS (SEPSIS): 2000.0000 mL | Freq: Once | INTRAVENOUS | Status: DC

## 2014-09-04 MED ORDER — LABETALOL HCL 200 MG PO TABS
200.0000 mg | ORAL_TABLET | Freq: Once | ORAL | Status: AC
  Administered 2014-09-04: 200 mg via ORAL
  Filled 2014-09-04: qty 1

## 2014-09-04 MED ORDER — CEPHALEXIN 500 MG PO CAPS: 500.0000 mg | ORAL_CAPSULE | Freq: Four times a day (QID) | ORAL | Status: DC

## 2014-09-04 NOTE — Discharge Instructions (Signed)
Contusion °A contusion is a deep bruise. Contusions are the result of an injury that caused bleeding under the skin. The contusion may turn blue, purple, or yellow. Minor injuries will give you a painless contusion, but more severe contusions may stay painful and swollen for a few weeks.  °CAUSES  °A contusion is usually caused by a blow, trauma, or direct force to an area of the body. °SYMPTOMS  °· Swelling and redness of the injured area. °· Bruising of the injured area. °· Tenderness and soreness of the injured area. °· Pain. °DIAGNOSIS  °The diagnosis can be made by taking a history and physical exam. An X-ray, CT scan, or MRI may be needed to determine if there were any associated injuries, such as fractures. °TREATMENT  °Specific treatment will depend on what area of the body was injured. In general, the best treatment for a contusion is resting, icing, elevating, and applying cold compresses to the injured area. Over-the-counter medicines may also be recommended for pain control. Ask your caregiver what the best treatment is for your contusion. °HOME CARE INSTRUCTIONS  °· Put ice on the injured area. °¨ Put ice in a plastic bag. °¨ Place a towel between your skin and the bag. °¨ Leave the ice on for 15-20 minutes, 3-4 times a day, or as directed by your health care provider. °· Only take over-the-counter or prescription medicines for pain, discomfort, or fever as directed by your caregiver. Your caregiver may recommend avoiding anti-inflammatory medicines (aspirin, ibuprofen, and naproxen) for 48 hours because these medicines may increase bruising. °· Rest the injured area. °· If possible, elevate the injured area to reduce swelling. °SEEK IMMEDIATE MEDICAL CARE IF:  °· You have increased bruising or swelling. °· You have pain that is getting worse. °· Your swelling or pain is not relieved with medicines. °MAKE SURE YOU:  °· Understand these instructions. °· Will watch your condition. °· Will get help right  away if you are not doing well or get worse. °Document Released: 12/23/2004 Document Revised: 03/20/2013 Document Reviewed: 01/18/2011 °[employer]® Patient Information ©2015 [employer]. This information is not intended to replace advice given to you by your health care provider. Make sure you discuss any questions you have with your health care provider. ° °

## 2014-09-04 NOTE — ED Provider Notes (Signed)
CSN: [id number]     Arrival date & time 09/04/14  1312 History   First MD Initiated Contact with Patient 09/04/14 1551     Chief Complaint  Patient presents with  . Arm Pain   HPI Patient presents to the emergency room with complaints of an area of swelling on his left arm. Patient bumped his left arm on a desk yesterday. He has developed an area of redness and swelling. Patient denies any trouble with any fevers. He has not noticed any abrasions or lacerations. Patient was concerned about the swelling because in the past he had a venous thrombosis of his left arm. Concern that this injury may lead to the same issue. He denies any trouble with any chest pain or shortness of breath. Patient also has a history of hypertension. He did take his medications today. Patient states he often has to varies his dose of medications because he varies from high blood pressure to low blood pressure. Past Medical History  Diagnosis Date  . Renal disorder   . Hypertension   . Diabetes mellitus without complication   . Hyperlipidemia    Past Surgical History  Procedure Laterality Date  . Kidney transplant    . Combined kidney-pancreas transplant    . Av fistula placement    . Eye surgery    . Nephrectomy transplanted organ    . Embolectomy Left 01/15/2013    Procedure: EMBOLECTOMY Left Brachial Artery With Patch Angioplasty;  Surgeon: Elam Dutch, MD;  Location: [place];  Service: Vascular;  Laterality: Left;   History reviewed. No pertinent family history. History  Substance Use Topics  . Smoking status: Never Smoker   . Smokeless tobacco: Not on file  . Alcohol Use: No    Review of Systems  All other systems reviewed and are negative.     Allergies  Review of patient's allergies indicates no known allergies.  Home Medications   Prior to Admission medications   Medication Sig Start Date End Date Taking? Authorizing Provider  amitriptyline (ELAVIL) 25 MG tablet Take 25 mg by mouth at  bedtime.   Yes Historical Provider, MD  aspirin 81 MG tablet Take 81 mg by mouth daily.   Yes Historical Provider, MD  atorvastatin (LIPITOR) 10 MG tablet Take 10 mg by mouth daily.   Yes Historical Provider, MD  calcitRIOL (ROCALTROL) 0.25 MCG capsule Take 0.25 mcg by mouth daily.   Yes Historical Provider, MD  cloNIDine (CATAPRES) 0.3 MG tablet Take 0.3 mg by mouth 3 (three) times daily.   Yes Historical Provider, MD  furosemide (LASIX) 20 MG tablet Take 20 mg by mouth 2 (two) times daily.   Yes Historical Provider, MD  glipiZIDE (GLUCOTROL) 10 MG tablet Take 10 mg by mouth 2 (two) times daily before a meal.   Yes Historical Provider, MD  insulin glargine (LANTUS) 100 UNIT/ML injection Inject 18 Units into the skin at bedtime.   Yes Historical Provider, MD  labetalol (NORMODYNE) 200 MG tablet Take 200-400 mg by mouth 2 (two) times daily. Take 1 tablet in the morning and take 2 tablets in the evening   Yes Historical Provider, MD  mycophenolate (CELLCEPT) 500 MG tablet Take by mouth 3 (three) times daily.   Yes Historical Provider, MD  pioglitazone (ACTOS) 45 MG tablet Take 45 mg by mouth daily.   Yes Historical Provider, MD  sennosides-docusate sodium (SENOKOT-S) 8.6-50 MG tablet Take 1 tablet by mouth 2 (two) times daily.   Yes Historical Provider, MD  tacrolimus (PROGRAF) 1 MG capsule Take 2 mg by mouth 2 (two) times daily.   Yes Historical Provider, MD  cephALEXin (KEFLEX) 500 MG capsule Take 1 capsule (500 mg total) by mouth 4 (four) times daily. 09/04/14   Dorie Rank, MD  enoxaparin (LOVENOX) 150 MG/ML injection Inject 0.9 mLs (135 mg total) into the skin daily. 01/17/13   Ulyses Amor, PA-C   BP 203/81 mmHg  Pulse 68  Temp(Src) 98.2 F (36.8 C) (Oral)  Resp 16  SpO2 96% Physical Exam  Constitutional: He appears well-developed and well-nourished. No distress.  HENT:  Head: Normocephalic and atraumatic.  Right Ear: External ear normal.  Left Ear: External ear normal.  Eyes:  Conjunctivae are normal. Right eye exhibits no discharge. Left eye exhibits no discharge. No scleral icterus.  Neck: Neck supple. No tracheal deviation present.  Cardiovascular: Normal rate, regular rhythm and intact distal pulses.   Pulmonary/Chest: Effort normal and breath sounds normal. No stridor. No respiratory distress. He has no wheezes. He has no rales.  Abdominal: Soft. Bowel sounds are normal. He exhibits no distension. There is no tenderness. There is no rebound and no guarding.  Musculoskeletal: He exhibits tenderness. He exhibits no edema.  Patient has an area of erythema and edema on the lateral aspect of his proximal forearm, no fluctuance, no edema of the rest of the forearm, no lymphangitic streaking, normal pulses  Neurological: He is alert. He has normal strength. No cranial nerve deficit (no facial droop, extraocular movements intact, no slurred speech) or sensory deficit. He exhibits normal muscle tone. He displays no seizure activity. Coordination normal.  Skin: Skin is warm and dry. No rash noted.  Psychiatric: He has a normal mood and affect.  Nursing note and vitals reviewed.   ED Course  Procedures (including critical care time) Labs Review Labs Reviewed - No data to display  Imaging Review No results found.   EKG Interpretation None      MDM   Final diagnoses:  Hematoma of arm, left, initial encounter  Essential hypertension    Patient has a hematoma. I reassured him that this type of injury is not the same as a blood clot in his arm. It would be a little bit quicker for him to have an infection. I will give him an a prescription for anabiotic sent instructed him to start taking them if in the next day or 2 if he notices increasing redness or swelling.  She was given additional blood pressure medication. He does not appear to be having any acute issues associated with that.    Dorie Rank, MD 09/04/14 1728

## 2014-09-04 NOTE — ED Notes (Signed)
Waiting on normodyne to be delivered from pharmacy

## 2014-09-04 NOTE — ED Notes (Signed)
Vital signs stable. 

## 2014-09-04 NOTE — ED Notes (Addendum)
Pt reports bumping his left arm on desk yesterday and now has large hematoma, redness and warmth  Noted. Hx of blood clot in left arm. Hypertensive at triage, reports taking meds this am as prescribed. No acute distress noted at triage.

## 2015-03-20 ENCOUNTER — Ambulatory Visit (INDEPENDENT_AMBULATORY_CARE_PROVIDER_SITE_OTHER): Payer: BLUE CROSS/BLUE SHIELD | Admitting: Family Medicine

## 2015-03-20 VITALS — BP 215/101 | HR 67 | Temp 98.1°F | Resp 16 | Ht 65.25 in | Wt 193.0 lb

## 2015-03-20 DIAGNOSIS — I10 Essential (primary) hypertension: Secondary | ICD-10-CM | POA: Diagnosis not present

## 2015-03-20 DIAGNOSIS — B029 Zoster without complications: Secondary | ICD-10-CM | POA: Diagnosis not present

## 2015-03-20 DIAGNOSIS — Z940 Kidney transplant status: Secondary | ICD-10-CM

## 2015-03-20 MED ORDER — VALACYCLOVIR HCL 1 G PO TABS: 1000.0000 mg | ORAL_TABLET | Freq: Three times a day (TID) | ORAL | Status: DC

## 2015-03-20 NOTE — Progress Notes (Signed)
This chart was scribed for Robyn Haber, MD by Moises Blood, medical scribe at [facility].The patient was seen in exam room 10 and the patient's care was started at 5:13 PM.  Patient ID: Eric Mccarty MRN: [id number], DOB: 30-Jun-1955, 59 y.o. Date of Encounter: 03/20/2015  Primary Physician: Louis Meckel, MD  Chief Complaint:  Chief Complaint  Patient presents with  . Rash    Onset 9 days    HPI:  Eric Mccarty is a 59 y.o. male who has h/o shingles presents to [facility] complaining of rash in groin area that started 2 days ago.His skin has been really sensitive. He states that last night he couldn't sleep well due to the rash.   He has history of kidney transplant. He's a patient at [facility].    He had high BP today. He takes clonidine for his BP, 3 times a day but it makes him dizziness. So he's been cutting it down to 2 pills a day, 1 at lunch and 1 at dinner.   Past Medical History  Diagnosis Date  . Renal disorder   . Hypertension   . Diabetes mellitus without complication ([place])   . Hyperlipidemia      Home Meds: Prior to Admission medications   Medication Sig Start Date End Date Taking? Authorizing Provider  amitriptyline (ELAVIL) 25 MG tablet Take 25 mg by mouth at bedtime.   Yes Historical Provider, MD  aspirin 81 MG tablet Take 81 mg by mouth daily.   Yes Historical Provider, MD  atorvastatin (LIPITOR) 10 MG tablet Take 10 mg by mouth daily.   Yes Historical Provider, MD  calcitRIOL (ROCALTROL) 0.25 MCG capsule Take 0.25 mcg by mouth daily.   Yes Historical Provider, MD  cloNIDine (CATAPRES) 0.3 MG tablet Take 0.3 mg by mouth 3 (three) times daily.   Yes Historical Provider, MD  furosemide (LASIX) 20 MG tablet Take 20 mg by mouth 2 (two) times daily.   Yes Historical Provider, MD  glipiZIDE (GLUCOTROL) 10 MG tablet Take 10 mg by mouth 2 (two) times daily before a meal.   Yes Historical  Provider, MD  insulin glargine (LANTUS) 100 UNIT/ML injection Inject 18 Units into the skin at bedtime.   Yes Historical Provider, MD  labetalol (NORMODYNE) 200 MG tablet Take 200-400 mg by mouth 2 (two) times daily. Take 1 tablet in the morning and take 2 tablets in the evening   Yes Historical Provider, MD  mycophenolate (CELLCEPT) 500 MG tablet Take by mouth 3 (three) times daily.   Yes Historical Provider, MD  pioglitazone (ACTOS) 45 MG tablet Take 45 mg by mouth daily.   Yes Historical Provider, MD  sorbitol 70 % solution Take 15 mLs by mouth daily as needed.   Yes Historical Provider, MD  tacrolimus (PROGRAF) 1 MG capsule Take 2 mg by mouth 2 (two) times daily.   Yes Historical Provider, MD  enoxaparin (LOVENOX) 150 MG/ML injection Inject 0.9 mLs (135 mg total) into the skin daily. Patient not taking: Reported on 03/20/2015 01/17/13   Ulyses Amor, PA-C  sennosides-docusate sodium (SENOKOT-S) 8.6-50 MG tablet Take 1 tablet by mouth 2 (two) times daily. Reported on 03/20/2015    Historical Provider, MD    Allergies: No Known Allergies  Social History   Social History  . Marital Status: Married    Spouse Name: N/A  . Number of Children: N/A  . Years of Education: N/A  Occupational History  . Not on file.   Social History Main Topics  . Smoking status: Never Smoker   . Smokeless tobacco: Not on file  . Alcohol Use: No  . Drug Use: Not on file  . Sexual Activity: Not on file   Other Topics Concern  . Not on file   Social History Narrative     Review of Systems: Constitutional: negative for fever, chills, night sweats, weight changes, or fatigue  HEENT: negative for vision changes, hearing loss, congestion, rhinorrhea, ST, epistaxis, or sinus pressure Cardiovascular: negative for chest pain or palpitations Respiratory: negative for hemoptysis, wheezing, shortness of breath, or cough Abdominal: negative for abdominal pain, nausea, vomiting, diarrhea, or  constipation Dermatological: positive for rash Neurologic: negative for headache, dizziness, or syncope All other systems reviewed and are otherwise negative with the exception to those above and in the HPI.  Physical Exam: Blood pressure 215/101, pulse 67, temperature 98.1 F (36.7 C), temperature source Oral, resp. rate 16, height 5' 5.25" (1.657 m), weight 193 lb (87.544 kg), SpO2 98 %., Body mass index is 31.88 kg/(m^2). General: Well developed, well nourished, in no acute distress. Head: Normocephalic, atraumatic, eyes without discharge, sclera non-icteric, nares are without discharge. Bilateral auditory canals clear, TM's are without perforation, pearly grey and translucent with reflective cone of light bilaterally. Oral cavity moist, posterior pharynx without exudate, erythema, peritonsillar abscess, or post nasal drip.  Neck: Supple. No thyromegaly. Full ROM. No lymphadenopathy. Lungs: Clear bilaterally to auscultation without wheezes, rales, or rhonchi. Breathing is unlabored. Heart: RRR with S1 S2. No murmurs, rubs, or gallops appreciated. Msk:  Strength and tone normal for age. Extremities/Skin: Warm and dry. Patchy vesicular patch on inside of right thigh Neuro: Alert and oriented X 3. Moves all extremities spontaneously. Gait is normal. CNII-XII grossly in tact. Psych:  Responds to questions appropriately with a normal affect.   BP recheck in room, sitting (left arm) : 200/90    ASSESSMENT AND PLAN:  59 y.o. year old male with shingles This chart was scribed in my presence and reviewed by me personally.    ICD-9-CM ICD-10-CM   1. Shingles 053.9 B02.9 valACYclovir (VALTREX) 1000 MG tablet  2. Accelerated hypertension 401.0 I10   3. Renal transplant recipient V42.0 Z94.0    Patient told to get back on the clonidine ASAP  By signing my name below, I, Moises Blood, attest that this documentation has been prepared under the direction and in the presence of Robyn Haber,  MD. Electronically Signed: Moises Blood, [place]. 03/20/2015 , 5:21 PM .  Signed, Robyn Haber, MD 03/20/2015 5:21 PM

## 2015-11-27 ENCOUNTER — Encounter (HOSPITAL_COMMUNITY): Admission: RE | Payer: Self-pay | Source: Ambulatory Visit

## 2015-11-27 ENCOUNTER — Ambulatory Visit (HOSPITAL_COMMUNITY)
Admission: RE | Admit: 2015-11-27 | Payer: BLUE CROSS/BLUE SHIELD | Source: Ambulatory Visit | Admitting: Gastroenterology

## 2015-11-27 SURGERY — COLONOSCOPY WITH PROPOFOL
Anesthesia: Monitor Anesthesia Care

## 2016-12-16 ENCOUNTER — Encounter (HOSPITAL_BASED_OUTPATIENT_CLINIC_OR_DEPARTMENT_OTHER): Payer: BLUE CROSS/BLUE SHIELD | Attending: Internal Medicine

## 2016-12-16 DIAGNOSIS — E11319 Type 2 diabetes mellitus with unspecified diabetic retinopathy without macular edema: Secondary | ICD-10-CM | POA: Insufficient documentation

## 2016-12-16 DIAGNOSIS — I87312 Chronic venous hypertension (idiopathic) with ulcer of left lower extremity: Secondary | ICD-10-CM | POA: Diagnosis not present

## 2016-12-16 DIAGNOSIS — Z794 Long term (current) use of insulin: Secondary | ICD-10-CM | POA: Diagnosis not present

## 2016-12-16 DIAGNOSIS — L97822 Non-pressure chronic ulcer of other part of left lower leg with fat layer exposed: Secondary | ICD-10-CM | POA: Insufficient documentation

## 2016-12-16 DIAGNOSIS — E11622 Type 2 diabetes mellitus with other skin ulcer: Secondary | ICD-10-CM | POA: Insufficient documentation

## 2016-12-16 DIAGNOSIS — Z940 Kidney transplant status: Secondary | ICD-10-CM | POA: Insufficient documentation

## 2016-12-16 DIAGNOSIS — Z9483 Pancreas transplant status: Secondary | ICD-10-CM | POA: Diagnosis not present

## 2016-12-16 DIAGNOSIS — I10 Essential (primary) hypertension: Secondary | ICD-10-CM | POA: Diagnosis not present

## 2016-12-16 DIAGNOSIS — E1140 Type 2 diabetes mellitus with diabetic neuropathy, unspecified: Secondary | ICD-10-CM | POA: Insufficient documentation

## 2016-12-20 ENCOUNTER — Other Ambulatory Visit (HOSPITAL_COMMUNITY): Payer: Self-pay | Admitting: Nephrology

## 2016-12-20 DIAGNOSIS — L97929 Non-pressure chronic ulcer of unspecified part of left lower leg with unspecified severity: Principal | ICD-10-CM

## 2016-12-20 DIAGNOSIS — E11622 Type 2 diabetes mellitus with other skin ulcer: Secondary | ICD-10-CM

## 2016-12-23 ENCOUNTER — Ambulatory Visit (HOSPITAL_COMMUNITY)
Admission: RE | Admit: 2016-12-23 | Discharge: 2016-12-23 | Disposition: A | Discharge status: Home / Self Care | Payer: BLUE CROSS/BLUE SHIELD | Source: Ambulatory Visit | Attending: Cardiology | Admitting: Cardiology

## 2016-12-23 ENCOUNTER — Emergency Department (HOSPITAL_COMMUNITY)
Admission: EM | Admit: 2016-12-23 | Discharge: 2016-12-24 | Disposition: A | Discharge status: Home / Self Care | Payer: BLUE CROSS/BLUE SHIELD | Attending: Physician Assistant | Admitting: Physician Assistant

## 2016-12-23 ENCOUNTER — Telehealth: Payer: Self-pay

## 2016-12-23 DIAGNOSIS — Z9483 Pancreas transplant status: Secondary | ICD-10-CM | POA: Insufficient documentation

## 2016-12-23 DIAGNOSIS — E11622 Type 2 diabetes mellitus with other skin ulcer: Secondary | ICD-10-CM | POA: Diagnosis not present

## 2016-12-23 DIAGNOSIS — Z7982 Long term (current) use of aspirin: Secondary | ICD-10-CM | POA: Insufficient documentation

## 2016-12-23 DIAGNOSIS — Z794 Long term (current) use of insulin: Secondary | ICD-10-CM | POA: Insufficient documentation

## 2016-12-23 DIAGNOSIS — Z79899 Other long term (current) drug therapy: Secondary | ICD-10-CM | POA: Diagnosis not present

## 2016-12-23 DIAGNOSIS — Z940 Kidney transplant status: Secondary | ICD-10-CM | POA: Insufficient documentation

## 2016-12-23 DIAGNOSIS — I10 Essential (primary) hypertension: Secondary | ICD-10-CM

## 2016-12-23 DIAGNOSIS — E119 Type 2 diabetes mellitus without complications: Secondary | ICD-10-CM | POA: Insufficient documentation

## 2016-12-23 DIAGNOSIS — L97929 Non-pressure chronic ulcer of unspecified part of left lower leg with unspecified severity: Secondary | ICD-10-CM | POA: Diagnosis not present

## 2016-12-23 DIAGNOSIS — R938 Abnormal findings on diagnostic imaging of other specified body structures: Secondary | ICD-10-CM | POA: Insufficient documentation

## 2016-12-23 LAB — BASIC METABOLIC PANEL
Anion gap: 10 (ref 5–15)
BUN: 27 mg/dL — ABNORMAL HIGH (ref 6–20)
CHLORIDE: 106 mmol/L (ref 101–111)
CO2: 22 mmol/L (ref 22–32)
Calcium: 9.3 mg/dL (ref 8.9–10.3)
Creatinine, Ser: 2.07 mg/dL — ABNORMAL HIGH (ref 0.61–1.24)
GFR calc Af Amer: 39 mL/min — ABNORMAL LOW (ref >60)
GFR calc non Af Amer: 33 mL/min — ABNORMAL LOW (ref >60)
GLUCOSE: 116 mg/dL — AB (ref 65–99)
POTASSIUM: 3.8 mmol/L (ref 3.5–5.1)
Sodium: 138 mmol/L (ref 135–145)

## 2016-12-23 LAB — CBC WITH DIFFERENTIAL/PLATELET
Basophils Absolute: 0 10*3/uL (ref 0.0–0.1)
Basophils Relative: 0 %
Eosinophils Absolute: 0.1 10*3/uL (ref 0.0–0.7)
Eosinophils Relative: 1 %
HCT: 39 % (ref 39.0–52.0)
Hemoglobin: 12.6 g/dL — ABNORMAL LOW (ref 13.0–17.0)
LYMPHS ABS: 1 10*3/uL (ref 0.7–4.0)
Lymphocytes Relative: 10 %
MCH: 28.7 pg (ref 26.0–34.0)
MCHC: 32.3 g/dL (ref 30.0–36.0)
MCV: 88.8 fL (ref 78.0–100.0)
MONOS PCT: 13 %
Monocytes Absolute: 1.1 10*3/uL — ABNORMAL HIGH (ref 0.1–1.0)
NEUTROS PCT: 76 %
Neutro Abs: 6.9 10*3/uL (ref 1.7–7.7)
Platelets: 237 10*3/uL (ref 150–400)
RBC: 4.39 MIL/uL (ref 4.22–5.81)
RDW: 14.1 % (ref 11.5–15.5)
WBC: 9.1 10*3/uL (ref 4.0–10.5)

## 2016-12-23 MED ORDER — CLONIDINE HCL 0.2 MG PO TABS
0.3000 mg | ORAL_TABLET | Freq: Once | ORAL | Status: AC
  Administered 2016-12-23: 0.3 mg via ORAL
  Filled 2016-12-23: qty 1

## 2016-12-23 MED ORDER — LABETALOL HCL 200 MG PO TABS
400.0000 mg | ORAL_TABLET | Freq: Once | ORAL | Status: AC
  Administered 2016-12-23: 400 mg via ORAL
  Filled 2016-12-23: qty 2

## 2016-12-23 NOTE — ED Triage Notes (Signed)
Patient noticed high BP last week at office visit for wound care. Went to wound care today and BP still elevated. Denies any blurry vision, HA, numb/tingling. No pain

## 2016-12-23 NOTE — Telephone Encounter (Signed)
Pt is here for ultrasound and tech came and wanted [place] to take pt's BP because she "could not hear it". Pt's BP right arm 236/104 left arm 228/100. Spoke with ordering doctor's office (Dr [facility]) the said to inform pt to go directly to [facility]. Pt notified of this direction and verbalizes understanding and states that he will go there directly from here. Dr's office states that Dr Moshe Cipro is at the hospital and to notify them that she is there and to contact her.

## 2016-12-23 NOTE — ED Provider Notes (Signed)
[facility] DEPT Provider Note   CSN: [id number] Arrival date & time: 12/23/16  1724     History   Chief Complaint Chief Complaint  Patient presents with  . Hypertension    HPI MAVRIC CORTRIGHT is a 61 y.o. male.  The history is provided by the patient. No language interpreter was used.  Hypertension  This is a new problem. The current episode started more than 1 week ago. The problem occurs constantly. The problem has been gradually worsening. Pertinent negatives include no shortness of breath. Nothing aggravates the symptoms. Nothing relieves the symptoms. He has tried nothing for the symptoms. The treatment provided no relief.  Pt reports he has a history of hypertension.  Pt was seen today at wound clinic and had elevated blood pressure.  Pt reports his blood pressure was elevated last week at wound care clinic a week ago.  Pt reports he is not having any symptoms.  Wound clinic called Dr. Moshe Cipro who advised pt to come to ED. Pt has had a renal transplant.    Past Medical History:  Diagnosis Date  . Diabetes mellitus without complication ([place])   . Hyperlipidemia   . Hypertension   . Renal disorder     Patient Active Problem List   Diagnosis Date Noted  . 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 AV FISTULA PLACEMENT    . COMBINED KIDNEY-PANCREAS TRANSPLANT    . EMBOLECTOMY Left 01/15/2013   Procedure: EMBOLECTOMY Left Brachial Artery With Patch Angioplasty;  Surgeon: Elam Dutch, MD;  Location: [facility];  Service: Vascular;  Laterality: Left;  . EYE SURGERY    . HERNIA REPAIR    . KIDNEY TRANSPLANT    . NEPHRECTOMY TRANSPLANTED ORGAN         Home Medications    Prior to Admission medications   Medication Sig Start Date End Date Taking? Authorizing Provider  amitriptyline (ELAVIL) 25 MG tablet Take 25 mg by mouth at bedtime.    [provider]  aspirin 81 MG tablet Take 81 mg by mouth daily.     [provider]  atorvastatin (LIPITOR) 10 MG tablet Take 10 mg by mouth daily.    [provider]  calcitRIOL (ROCALTROL) 0.25 MCG capsule Take 0.25 mcg by mouth daily.    [provider]  cloNIDine (CATAPRES) 0.3 MG tablet Take 0.3 mg by mouth 3 (three) times daily.    [provider]  enoxaparin (LOVENOX) 150 MG/ML injection Inject 0.9 mLs (135 mg total) into the skin daily. Patient not taking: Reported on 03/20/2015 01/17/13   Ulyses Amor, PA-C  furosemide (LASIX) 20 MG tablet Take 20 mg by mouth 2 (two) times daily.    [provider]  glipiZIDE (GLUCOTROL) 10 MG tablet Take 10 mg by mouth 2 (two) times daily before a meal.    [provider]  insulin glargine (LANTUS) 100 UNIT/ML injection Inject 18 Units into the skin at bedtime.    [provider]  labetalol (NORMODYNE) 200 MG tablet Take 200-400 mg by mouth 2 (two) times daily. Take 1 tablet in the morning and take 2 tablets in the evening    [provider]  mycophenolate (CELLCEPT) 500 MG tablet Take by mouth 3 (three) times daily.    [provider]  pioglitazone (ACTOS) 45 MG tablet Take 45 mg by mouth daily.    [provider]  sennosides-docusate sodium (SENOKOT-S) 8.6-50 MG tablet  Take 1 tablet by mouth 2 (two) times daily. Reported on 03/20/2015    [provider]  sorbitol 70 % solution Take 15 mLs by mouth daily as needed.    [provider]  tacrolimus (PROGRAF) 1 MG capsule Take 2 mg by mouth 2 (two) times daily.    [provider]  valACYclovir (VALTREX) 1000 MG tablet Take 1 tablet (1,000 mg total) by mouth 3 (three) times daily. 03/20/15   Robyn Haber, MD    Family History Family History  Problem Relation Age of Onset  . Diabetes Mother   . Hypertension Mother   . Hypertension Father     Social History Social History  Substance Use Topics  . Smoking status: Never Smoker  . Smokeless  tobacco: Not on file  . Alcohol use No     Allergies   Patient has no known allergies.   Review of Systems Review of Systems  Respiratory: Negative for shortness of breath.   All other systems reviewed and are negative.    Physical Exam Updated Vital Signs BP (!) 234/116 (BP Location: Right Arm)   Pulse 72   Temp 98 F (36.7 C) (Oral)   Resp 18   Ht 5\' 7"  (1.702 m)   Wt 86.2 kg (190 lb)   SpO2 98%   BMI 29.76 kg/m   Physical Exam  Constitutional: He appears well-developed and well-nourished.  HENT:  Head: Normocephalic and atraumatic.  Eyes: Conjunctivae are normal.  Neck: Neck supple.  Cardiovascular: Normal rate and regular rhythm.   No murmur heard. Pulmonary/Chest: Effort normal and breath sounds normal. No respiratory distress.  Abdominal: Soft. There is no tenderness.  Musculoskeletal: He exhibits no edema.  Neurological: He is alert.  Skin: Skin is warm and dry.  Psychiatric: He has a normal mood and affect.  Nursing note and vitals reviewed.    ED Treatments / Results  Labs (all labs ordered are listed, but only abnormal results are displayed) Labs Reviewed - No data to display  EKG  EKG Interpretation None       Radiology No results found.  Procedures Procedures (including critical care time)  Medications Ordered in ED Medications - No data to display   Initial Impression / Assessment and Plan / ED Course  I have reviewed the triage vital signs and the nursing notes.  Pertinent labs & imaging results that were available during my care of the patient were reviewed by me and considered in my medical decision making (see chart for details).     Pt took clonidine this am.  He has not had midday dose or evening dosage of blood pressure medications.  Pt reports he has a history of his blood pressure going to low so he takes clonidine twice a day some days and three times a day on other days.  Pt given home medications that he missed.   Blood pressure decreased to 160/70.  Pt has no current complaints.  Pt advised to follow up with Dr. Clover Mealy about increased creatinine.  Pt advised to take clonidine as directed tid as well as other blood pressure medications.  Final Clinical Impressions(s) / ED Diagnoses   Final diagnoses:  Hypertension, unspecified type    New Prescriptions New Prescriptions   No medications on file  An After Visit Summary was printed and given to the patient.    Fransico Meadow, PA-C 12/24/16 0126    Macarthur Critchley, MD 12/27/16 [phone]

## 2016-12-24 NOTE — Discharge Instructions (Signed)
Take blood pressure medication as directed.   Your creat is 2.07.  Follow up with Dr. Moshe Cipro for recheck.

## 2016-12-24 NOTE — ED Notes (Signed)
PT states understanding of care given, follow up care. PT ambulated from ED to car with a steady gait.  

## 2017-01-06 ENCOUNTER — Encounter (HOSPITAL_BASED_OUTPATIENT_CLINIC_OR_DEPARTMENT_OTHER): Payer: BLUE CROSS/BLUE SHIELD | Attending: Internal Medicine

## 2017-01-06 DIAGNOSIS — I87312 Chronic venous hypertension (idiopathic) with ulcer of left lower extremity: Secondary | ICD-10-CM | POA: Insufficient documentation

## 2017-01-06 DIAGNOSIS — Z940 Kidney transplant status: Secondary | ICD-10-CM | POA: Insufficient documentation

## 2017-01-06 DIAGNOSIS — L97822 Non-pressure chronic ulcer of other part of left lower leg with fat layer exposed: Secondary | ICD-10-CM | POA: Insufficient documentation

## 2017-01-06 DIAGNOSIS — E11622 Type 2 diabetes mellitus with other skin ulcer: Secondary | ICD-10-CM | POA: Insufficient documentation

## 2017-01-06 DIAGNOSIS — E1140 Type 2 diabetes mellitus with diabetic neuropathy, unspecified: Secondary | ICD-10-CM | POA: Insufficient documentation

## 2017-01-06 DIAGNOSIS — I10 Essential (primary) hypertension: Secondary | ICD-10-CM | POA: Insufficient documentation

## 2017-01-07 DIAGNOSIS — L97822 Non-pressure chronic ulcer of other part of left lower leg with fat layer exposed: Secondary | ICD-10-CM | POA: Diagnosis not present

## 2017-01-07 DIAGNOSIS — I87312 Chronic venous hypertension (idiopathic) with ulcer of left lower extremity: Secondary | ICD-10-CM | POA: Diagnosis present

## 2017-01-07 DIAGNOSIS — Z940 Kidney transplant status: Secondary | ICD-10-CM | POA: Diagnosis not present

## 2017-01-07 DIAGNOSIS — I10 Essential (primary) hypertension: Secondary | ICD-10-CM | POA: Diagnosis not present

## 2017-01-07 DIAGNOSIS — E1140 Type 2 diabetes mellitus with diabetic neuropathy, unspecified: Secondary | ICD-10-CM | POA: Diagnosis not present

## 2017-01-07 DIAGNOSIS — E11622 Type 2 diabetes mellitus with other skin ulcer: Secondary | ICD-10-CM | POA: Diagnosis not present

## 2017-01-14 DIAGNOSIS — I87312 Chronic venous hypertension (idiopathic) with ulcer of left lower extremity: Secondary | ICD-10-CM | POA: Diagnosis not present

## 2017-01-21 DIAGNOSIS — I87312 Chronic venous hypertension (idiopathic) with ulcer of left lower extremity: Secondary | ICD-10-CM | POA: Diagnosis not present

## 2017-01-26 ENCOUNTER — Other Ambulatory Visit: Payer: Self-pay | Admitting: Internal Medicine

## 2017-01-26 ENCOUNTER — Ambulatory Visit (HOSPITAL_COMMUNITY)
Admission: RE | Admit: 2017-01-26 | Discharge: 2017-01-26 | Disposition: A | Discharge status: Home / Self Care | Payer: BLUE CROSS/BLUE SHIELD | Source: Ambulatory Visit | Attending: Vascular Surgery | Admitting: Vascular Surgery

## 2017-01-26 DIAGNOSIS — L98499 Non-pressure chronic ulcer of skin of other sites with unspecified severity: Secondary | ICD-10-CM

## 2017-01-27 ENCOUNTER — Encounter (HOSPITAL_BASED_OUTPATIENT_CLINIC_OR_DEPARTMENT_OTHER): Payer: BLUE CROSS/BLUE SHIELD | Attending: Internal Medicine

## 2017-01-27 DIAGNOSIS — C44719 Basal cell carcinoma of skin of left lower limb, including hip: Secondary | ICD-10-CM | POA: Diagnosis not present

## 2017-01-27 DIAGNOSIS — I87312 Chronic venous hypertension (idiopathic) with ulcer of left lower extremity: Secondary | ICD-10-CM | POA: Diagnosis present

## 2017-01-27 DIAGNOSIS — E11622 Type 2 diabetes mellitus with other skin ulcer: Secondary | ICD-10-CM | POA: Diagnosis not present

## 2017-01-27 DIAGNOSIS — Z85828 Personal history of other malignant neoplasm of skin: Secondary | ICD-10-CM | POA: Insufficient documentation

## 2017-01-27 DIAGNOSIS — Z79899 Other long term (current) drug therapy: Secondary | ICD-10-CM | POA: Insufficient documentation

## 2017-01-27 DIAGNOSIS — E1140 Type 2 diabetes mellitus with diabetic neuropathy, unspecified: Secondary | ICD-10-CM | POA: Insufficient documentation

## 2017-02-03 ENCOUNTER — Other Ambulatory Visit: Payer: Self-pay | Admitting: Internal Medicine

## 2017-02-03 DIAGNOSIS — I87312 Chronic venous hypertension (idiopathic) with ulcer of left lower extremity: Secondary | ICD-10-CM | POA: Diagnosis not present

## 2017-02-10 DIAGNOSIS — I87312 Chronic venous hypertension (idiopathic) with ulcer of left lower extremity: Secondary | ICD-10-CM | POA: Diagnosis not present

## 2017-02-23 ENCOUNTER — Encounter: Payer: BLUE CROSS/BLUE SHIELD | Admitting: Vascular Surgery

## 2017-02-24 DIAGNOSIS — I87312 Chronic venous hypertension (idiopathic) with ulcer of left lower extremity: Secondary | ICD-10-CM | POA: Diagnosis not present

## 2017-03-10 ENCOUNTER — Encounter (HOSPITAL_BASED_OUTPATIENT_CLINIC_OR_DEPARTMENT_OTHER): Payer: BLUE CROSS/BLUE SHIELD | Attending: Internal Medicine

## 2017-03-10 DIAGNOSIS — Z9483 Pancreas transplant status: Secondary | ICD-10-CM | POA: Diagnosis not present

## 2017-03-10 DIAGNOSIS — C44719 Basal cell carcinoma of skin of left lower limb, including hip: Secondary | ICD-10-CM | POA: Insufficient documentation

## 2017-03-10 DIAGNOSIS — I10 Essential (primary) hypertension: Secondary | ICD-10-CM | POA: Insufficient documentation

## 2017-03-10 DIAGNOSIS — L97822 Non-pressure chronic ulcer of other part of left lower leg with fat layer exposed: Secondary | ICD-10-CM | POA: Diagnosis not present

## 2017-03-10 DIAGNOSIS — Z940 Kidney transplant status: Secondary | ICD-10-CM | POA: Insufficient documentation

## 2017-03-10 DIAGNOSIS — E1040 Type 1 diabetes mellitus with diabetic neuropathy, unspecified: Secondary | ICD-10-CM | POA: Insufficient documentation

## 2017-03-10 DIAGNOSIS — E10622 Type 1 diabetes mellitus with other skin ulcer: Secondary | ICD-10-CM | POA: Diagnosis not present

## 2017-03-10 DIAGNOSIS — Z794 Long term (current) use of insulin: Secondary | ICD-10-CM | POA: Insufficient documentation

## 2017-04-04 DIAGNOSIS — C44719 Basal cell carcinoma of skin of left lower limb, including hip: Secondary | ICD-10-CM | POA: Insufficient documentation

## 2017-04-13 ENCOUNTER — Encounter (HOSPITAL_BASED_OUTPATIENT_CLINIC_OR_DEPARTMENT_OTHER)
Admission: RE | Admit: 2017-04-13 | Discharge: 2017-04-13 | Disposition: A | Discharge status: Home / Self Care | Payer: BLUE CROSS/BLUE SHIELD | Source: Ambulatory Visit | Attending: Plastic Surgery | Admitting: Plastic Surgery

## 2017-04-13 ENCOUNTER — Encounter (HOSPITAL_BASED_OUTPATIENT_CLINIC_OR_DEPARTMENT_OTHER): Payer: Self-pay | Admitting: *Deleted

## 2017-04-13 ENCOUNTER — Other Ambulatory Visit: Payer: Self-pay

## 2017-04-13 DIAGNOSIS — C44719 Basal cell carcinoma of skin of left lower limb, including hip: Secondary | ICD-10-CM | POA: Diagnosis not present

## 2017-04-13 DIAGNOSIS — E119 Type 2 diabetes mellitus without complications: Secondary | ICD-10-CM | POA: Diagnosis not present

## 2017-04-13 DIAGNOSIS — I10 Essential (primary) hypertension: Secondary | ICD-10-CM | POA: Diagnosis present

## 2017-04-13 DIAGNOSIS — R9431 Abnormal electrocardiogram [ECG] [EKG]: Secondary | ICD-10-CM | POA: Insufficient documentation

## 2017-04-13 LAB — BASIC METABOLIC PANEL
Anion gap: 10 (ref 5–15)
BUN: 59 mg/dL — ABNORMAL HIGH (ref 6–20)
CALCIUM: 9.3 mg/dL (ref 8.9–10.3)
CO2: 23 mmol/L (ref 22–32)
Chloride: 107 mmol/L (ref 101–111)
Creatinine, Ser: 2.45 mg/dL — ABNORMAL HIGH (ref 0.61–1.24)
GFR calc non Af Amer: 27 mL/min — ABNORMAL LOW (ref >60)
GFR, EST AFRICAN AMERICAN: 31 mL/min — AB (ref >60)
Glucose, Bld: 146 mg/dL — ABNORMAL HIGH (ref 65–99)
Potassium: 4.3 mmol/L (ref 3.5–5.1)
SODIUM: 140 mmol/L (ref 135–145)

## 2017-04-13 NOTE — Progress Notes (Addendum)
EKG reviewed by Dr. Conrad Tallaboa, will proceed with surgery as scheduled.   Labs reviewed by Dr. Jillyn Hidden, will proceed with surgery as scheduled. Pt notified of creatine level per pt's request.

## 2017-04-19 ENCOUNTER — Ambulatory Visit: Payer: Self-pay | Admitting: Plastic Surgery

## 2017-04-19 DIAGNOSIS — C44702 Unspecified malignant neoplasm of skin of right lower limb, including hip: Secondary | ICD-10-CM

## 2017-04-20 ENCOUNTER — Ambulatory Visit (HOSPITAL_BASED_OUTPATIENT_CLINIC_OR_DEPARTMENT_OTHER): Payer: BLUE CROSS/BLUE SHIELD | Admitting: Anesthesiology

## 2017-04-20 ENCOUNTER — Other Ambulatory Visit: Payer: Self-pay

## 2017-04-20 ENCOUNTER — Encounter (HOSPITAL_BASED_OUTPATIENT_CLINIC_OR_DEPARTMENT_OTHER): Payer: Self-pay | Admitting: Plastic Surgery

## 2017-04-20 ENCOUNTER — Encounter (HOSPITAL_BASED_OUTPATIENT_CLINIC_OR_DEPARTMENT_OTHER)
Admission: RE | Disposition: A | Discharge status: Home / Self Care | Payer: Self-pay | Source: Ambulatory Visit | Attending: Plastic Surgery

## 2017-04-20 ENCOUNTER — Ambulatory Visit (HOSPITAL_BASED_OUTPATIENT_CLINIC_OR_DEPARTMENT_OTHER)
Admission: RE | Admit: 2017-04-20 | Discharge: 2017-04-20 | Disposition: A | Discharge status: Home / Self Care | Payer: BLUE CROSS/BLUE SHIELD | Source: Ambulatory Visit | Attending: Plastic Surgery | Admitting: Plastic Surgery

## 2017-04-20 DIAGNOSIS — E1151 Type 2 diabetes mellitus with diabetic peripheral angiopathy without gangrene: Secondary | ICD-10-CM | POA: Insufficient documentation

## 2017-04-20 DIAGNOSIS — I10 Essential (primary) hypertension: Secondary | ICD-10-CM | POA: Insufficient documentation

## 2017-04-20 DIAGNOSIS — C44702 Unspecified malignant neoplasm of skin of right lower limb, including hip: Secondary | ICD-10-CM

## 2017-04-20 DIAGNOSIS — E785 Hyperlipidemia, unspecified: Secondary | ICD-10-CM | POA: Insufficient documentation

## 2017-04-20 DIAGNOSIS — Z833 Family history of diabetes mellitus: Secondary | ICD-10-CM | POA: Diagnosis not present

## 2017-04-20 DIAGNOSIS — C44719 Basal cell carcinoma of skin of left lower limb, including hip: Secondary | ICD-10-CM | POA: Insufficient documentation

## 2017-04-20 DIAGNOSIS — Z794 Long term (current) use of insulin: Secondary | ICD-10-CM | POA: Insufficient documentation

## 2017-04-20 DIAGNOSIS — Z940 Kidney transplant status: Secondary | ICD-10-CM | POA: Insufficient documentation

## 2017-04-20 DIAGNOSIS — Z9483 Pancreas transplant status: Secondary | ICD-10-CM | POA: Insufficient documentation

## 2017-04-20 DIAGNOSIS — Z8249 Family history of ischemic heart disease and other diseases of the circulatory system: Secondary | ICD-10-CM | POA: Insufficient documentation

## 2017-04-20 DIAGNOSIS — Z483 Aftercare following surgery for neoplasm: Secondary | ICD-10-CM | POA: Diagnosis not present

## 2017-04-20 DIAGNOSIS — Z7982 Long term (current) use of aspirin: Secondary | ICD-10-CM | POA: Diagnosis not present

## 2017-04-20 DIAGNOSIS — Z79899 Other long term (current) drug therapy: Secondary | ICD-10-CM | POA: Insufficient documentation

## 2017-04-20 DIAGNOSIS — Z9889 Other specified postprocedural states: Secondary | ICD-10-CM | POA: Diagnosis not present

## 2017-04-20 HISTORY — DX: Kidney transplant status: Z94.0

## 2017-04-20 HISTORY — PX: APPLICATION OF WOUND VAC: SHX5189

## 2017-04-20 LAB — POCT HEMOGLOBIN-HEMACUE: Hemoglobin: 12.4 g/dL — ABNORMAL LOW (ref 13.0–17.0)

## 2017-04-20 LAB — GLUCOSE, CAPILLARY
GLUCOSE-CAPILLARY: 199 mg/dL — AB (ref 65–99)
Glucose-Capillary: 209 mg/dL — ABNORMAL HIGH (ref 65–99)

## 2017-04-20 SURGERY — APPLICATION, WOUND VAC
Anesthesia: General | Laterality: Left

## 2017-04-20 MED ORDER — PROPOFOL 500 MG/50ML IV EMUL
INTRAVENOUS | Status: AC
  Filled 2017-04-20: qty 50

## 2017-04-20 MED ORDER — SODIUM CHLORIDE 0.9% FLUSH: 3.0000 mL | INTRAVENOUS | Status: DC | PRN

## 2017-04-20 MED ORDER — FENTANYL CITRATE (PF) 100 MCG/2ML IJ SOLN
50.0000 ug | INTRAMUSCULAR | Status: DC | PRN
  Administered 2017-04-20 (×2): 50 ug via INTRAVENOUS

## 2017-04-20 MED ORDER — SODIUM CHLORIDE 0.9 % IV SOLN: 250.0000 mL | INTRAVENOUS | Status: DC | PRN

## 2017-04-20 MED ORDER — CEFAZOLIN SODIUM-DEXTROSE 2-4 GM/100ML-% IV SOLN
INTRAVENOUS | Status: AC
  Filled 2017-04-20: qty 100

## 2017-04-20 MED ORDER — BUPIVACAINE-EPINEPHRINE (PF) 0.5% -1:200000 IJ SOLN
INTRAMUSCULAR | Status: AC
  Filled 2017-04-20: qty 60

## 2017-04-20 MED ORDER — HYDROMORPHONE HCL 1 MG/ML IJ SOLN: 0.2500 mg | INTRAMUSCULAR | Status: DC | PRN

## 2017-04-20 MED ORDER — SODIUM CHLORIDE 0.9% FLUSH: 3.0000 mL | Freq: Two times a day (BID) | INTRAVENOUS | Status: DC

## 2017-04-20 MED ORDER — MIDAZOLAM HCL 2 MG/2ML IJ SOLN
1.0000 mg | INTRAMUSCULAR | Status: DC | PRN
  Administered 2017-04-20: 1 mg via INTRAVENOUS

## 2017-04-20 MED ORDER — LIDOCAINE HCL (CARDIAC) 20 MG/ML IV SOLN
INTRAVENOUS | Status: DC | PRN
  Administered 2017-04-20: 100 mg via INTRAVENOUS

## 2017-04-20 MED ORDER — ONDANSETRON HCL 4 MG/2ML IJ SOLN
INTRAMUSCULAR | Status: DC | PRN
  Administered 2017-04-20: 4 mg via INTRAVENOUS

## 2017-04-20 MED ORDER — LACTATED RINGERS IV SOLN
INTRAVENOUS | Status: DC
  Administered 2017-04-20: 09:00:00 via INTRAVENOUS

## 2017-04-20 MED ORDER — CEFAZOLIN SODIUM-DEXTROSE 2-4 GM/100ML-% IV SOLN
2.0000 g | INTRAVENOUS | Status: AC
  Administered 2017-04-20: 2 g via INTRAVENOUS

## 2017-04-20 MED ORDER — SCOPOLAMINE 1 MG/3DAYS TD PT72: 1.0000 | MEDICATED_PATCH | Freq: Once | TRANSDERMAL | Status: DC | PRN

## 2017-04-20 MED ORDER — ACETAMINOPHEN 325 MG PO TABS: 650.0000 mg | ORAL_TABLET | ORAL | Status: DC | PRN

## 2017-04-20 MED ORDER — BACITRACIN-NEOMYCIN-POLYMYXIN 400-5-5000 EX OINT
TOPICAL_OINTMENT | CUTANEOUS | Status: AC
  Filled 2017-04-20: qty 1

## 2017-04-20 MED ORDER — DEXAMETHASONE SODIUM PHOSPHATE 10 MG/ML IJ SOLN
INTRAMUSCULAR | Status: AC
Start: 2017-04-20 — End: ?
  Filled 2017-04-20: qty 1

## 2017-04-20 MED ORDER — MIDAZOLAM HCL 2 MG/2ML IJ SOLN
INTRAMUSCULAR | Status: AC
  Filled 2017-04-20: qty 2

## 2017-04-20 MED ORDER — ACETAMINOPHEN 650 MG RE SUPP: 650.0000 mg | RECTAL | Status: DC | PRN

## 2017-04-20 MED ORDER — ONDANSETRON HCL 4 MG/2ML IJ SOLN
INTRAMUSCULAR | Status: AC
  Filled 2017-04-20: qty 2

## 2017-04-20 MED ORDER — FENTANYL CITRATE (PF) 100 MCG/2ML IJ SOLN
INTRAMUSCULAR | Status: AC
  Filled 2017-04-20: qty 2

## 2017-04-20 MED ORDER — PROPOFOL 10 MG/ML IV BOLUS
INTRAVENOUS | Status: DC | PRN
  Administered 2017-04-20: 120 mg via INTRAVENOUS

## 2017-04-20 MED ORDER — LIDOCAINE-EPINEPHRINE 1 %-1:100000 IJ SOLN
INTRAMUSCULAR | Status: AC
  Filled 2017-04-20: qty 2

## 2017-04-20 MED ORDER — PROMETHAZINE HCL 25 MG/ML IJ SOLN
6.2500 mg | INTRAMUSCULAR | Status: DC | PRN
Start: 2017-04-20 — End: 2017-04-20

## 2017-04-20 MED ORDER — BUPIVACAINE HCL (PF) 0.25 % IJ SOLN
INTRAMUSCULAR | Status: AC
  Filled 2017-04-20: qty 60

## 2017-04-20 SURGICAL SUPPLY — 81 items
ADH SKN CLS APL DERMABOND .7 (GAUZE/BANDAGES/DRESSINGS)
AGENT HMST GEL NTOXPRSV FR 28 (Miscellaneous) ×1 IMPLANT
AGENT HMST PWDR HDRLZ BVN CLGN (Miscellaneous) ×1 IMPLANT
APL SKNCLS STERI-STRIP NONHPOA (GAUZE/BANDAGES/DRESSINGS)
BAG DECANTER FOR FLEXI CONT (MISCELLANEOUS) ×3 IMPLANT
BANDAGE ACE 3X5.8 VEL STRL LF (GAUZE/BANDAGES/DRESSINGS) IMPLANT
BANDAGE ACE 4X5 VEL STRL LF (GAUZE/BANDAGES/DRESSINGS) ×2 IMPLANT
BANDAGE ACE 6X5 VEL STRL LF (GAUZE/BANDAGES/DRESSINGS) IMPLANT
BENZOIN TINCTURE PRP APPL 2/3 (GAUZE/BANDAGES/DRESSINGS) IMPLANT
BLADE HEX COATED 2.75 (ELECTRODE) IMPLANT
BLADE MINI RND TIP GREEN BEAV (BLADE) IMPLANT
BLADE SURG 10 STRL SS (BLADE) IMPLANT
BLADE SURG 15 STRL LF DISP TIS (BLADE) ×1 IMPLANT
BLADE SURG 15 STRL SS (BLADE) ×3
BNDG COHESIVE 4X5 TAN STRL (GAUZE/BANDAGES/DRESSINGS) IMPLANT
BNDG GAUZE ELAST 4 BULKY (GAUZE/BANDAGES/DRESSINGS) ×3 IMPLANT
CANISTER SUCT 1200ML W/VALVE (MISCELLANEOUS) IMPLANT
COLLAGEN CELLERATERX 1 GRAM (Miscellaneous) ×2 IMPLANT
CORD BIPOLAR FORCEPS 12FT (ELECTRODE) IMPLANT
COVER BACK TABLE 60X90IN (DRAPES) ×3 IMPLANT
COVER MAYO STAND STRL (DRAPES) ×3 IMPLANT
DECANTER SPIKE VIAL GLASS SM (MISCELLANEOUS) IMPLANT
DERMABOND ADVANCED (GAUZE/BANDAGES/DRESSINGS)
DERMABOND ADVANCED .7 DNX12 (GAUZE/BANDAGES/DRESSINGS) IMPLANT
DRAIN PENROSE 1/2X12 LTX STRL (WOUND CARE) IMPLANT
DRAPE INCISE IOBAN 66X45 STRL (DRAPES) ×2 IMPLANT
DRAPE U-SHAPE 76X120 STRL (DRAPES) ×3 IMPLANT
DRESSING DUODERM 4X4 STERILE (GAUZE/BANDAGES/DRESSINGS) ×2 IMPLANT
DRSG ADAPTIC 3X8 NADH LF (GAUZE/BANDAGES/DRESSINGS) IMPLANT
DRSG EMULSION OIL 3X3 NADH (GAUZE/BANDAGES/DRESSINGS) ×2 IMPLANT
DRSG PAD ABDOMINAL 8X10 ST (GAUZE/BANDAGES/DRESSINGS) IMPLANT
ELECT NDL TIP 2.8 STRL (NEEDLE) IMPLANT
ELECT NEEDLE TIP 2.8 STRL (NEEDLE) IMPLANT
ELECT REM PT RETURN 9FT ADLT (ELECTROSURGICAL)
ELECTRODE REM PT RTRN 9FT ADLT (ELECTROSURGICAL) ×1 IMPLANT
GAUZE SPONGE 4X4 12PLY STRL (GAUZE/BANDAGES/DRESSINGS) ×3 IMPLANT
GAUZE SPONGE 4X4 12PLY STRL LF (GAUZE/BANDAGES/DRESSINGS) IMPLANT
GEL CELLERATE 28G (Miscellaneous) ×2 IMPLANT
GLOVE BIO SURGEON STRL SZ 6.5 (GLOVE) ×4 IMPLANT
GLOVE BIO SURGEONS STRL SZ 6.5 (GLOVE) ×2
GOWN STRL REUS W/ TWL LRG LVL3 (GOWN DISPOSABLE) ×2 IMPLANT
GOWN STRL REUS W/TWL LRG LVL3 (GOWN DISPOSABLE) ×6
NDL HYPO 25X1 1.5 SAFETY (NEEDLE) IMPLANT
NDL PRECISIONGLIDE 27X1.5 (NEEDLE) IMPLANT
NEEDLE HYPO 25X1 1.5 SAFETY (NEEDLE) IMPLANT
NEEDLE PRECISIONGLIDE 27X1.5 (NEEDLE) IMPLANT
NS IRRIG 1000ML POUR BTL (IV SOLUTION) ×3 IMPLANT
PACK BASIN DAY SURGERY FS (CUSTOM PROCEDURE TRAY) ×3 IMPLANT
PADDING CAST ABS 3INX4YD NS (CAST SUPPLIES)
PADDING CAST ABS 4INX4YD NS (CAST SUPPLIES)
PADDING CAST ABS COTTON 3X4 (CAST SUPPLIES) IMPLANT
PADDING CAST ABS COTTON 4X4 ST (CAST SUPPLIES) IMPLANT
PENCIL BUTTON HOLSTER BLD 10FT (ELECTRODE) IMPLANT
SHEET MEDIUM DRAPE 40X70 STRL (DRAPES) ×3 IMPLANT
SLEEVE SCD COMPRESS KNEE MED (MISCELLANEOUS) IMPLANT
SPLINT FIBERGLASS 3X35 (CAST SUPPLIES) ×3 IMPLANT
SPLINT FIBERGLASS 4X30 (CAST SUPPLIES) IMPLANT
SPLINT PLASTER CAST XFAST 3X15 (CAST SUPPLIES) IMPLANT
SPLINT PLASTER XTRA FASTSET 3X (CAST SUPPLIES)
SPONGE LAP 18X18 X RAY DECT (DISPOSABLE) ×3 IMPLANT
STAPLER VISISTAT 35W (STAPLE) IMPLANT
STOCKINETTE 4X48 STRL (DRAPES) IMPLANT
STOCKINETTE 6  STRL (DRAPES) ×2
STOCKINETTE 6 STRL (DRAPES) ×1 IMPLANT
STOCKINETTE IMPERVIOUS LG (DRAPES) IMPLANT
SUCTION FRAZIER HANDLE 10FR (MISCELLANEOUS)
SUCTION TUBE FRAZIER 10FR DISP (MISCELLANEOUS) IMPLANT
SURGILUBE 2OZ TUBE FLIPTOP (MISCELLANEOUS) IMPLANT
SUT SILK 3 0 PS 1 (SUTURE) IMPLANT
SUT VIC AB 3-0 FS2 27 (SUTURE) IMPLANT
SUT VIC AB 5-0 P-3 18X BRD (SUTURE) IMPLANT
SUT VIC AB 5-0 P3 18 (SUTURE)
SUT VIC AB 5-0 PS2 18 (SUTURE) ×3 IMPLANT
SYR BULB IRRIGATION 50ML (SYRINGE) ×3 IMPLANT
SYR CONTROL 10ML LL (SYRINGE) ×3 IMPLANT
TOWEL OR 17X24 6PK STRL BLUE (TOWEL DISPOSABLE) ×3 IMPLANT
TRAY DSU PREP LF (CUSTOM PROCEDURE TRAY) ×3 IMPLANT
TUBE CONNECTING 20'X1/4 (TUBING)
TUBE CONNECTING 20X1/4 (TUBING) ×1 IMPLANT
UNDERPAD 30X30 (UNDERPADS AND DIAPERS) ×3 IMPLANT
YANKAUER SUCT BULB TIP NO VENT (SUCTIONS) ×1 IMPLANT

## 2017-04-20 NOTE — Anesthesia Postprocedure Evaluation (Signed)
Anesthesia Post Note  Patient: Eric Mccarty  Procedure(s) Performed: APPLICATION OF WOUND VAC LEFT LOWER EXTREMITY (Left )     Patient location during evaluation: PACU Anesthesia Type: General Level of consciousness: sedated Pain management: pain level controlled Vital Signs Assessment: post-procedure vital signs reviewed and stable Respiratory status: spontaneous breathing and respiratory function stable Cardiovascular status: stable Postop Assessment: no apparent nausea or vomiting Anesthetic complications: no    Last Vitals:  Vitals:   04/20/17 1100 04/20/17 1110  BP: (!) 124/54 (!) 139/54  Pulse: (!) 56 (!) 59  Resp: 14 18  Temp:  36.4 C  SpO2: 97% 97%    Last Pain:  Vitals:   04/20/17 1110  TempSrc: Oral  PainSc: 0-No pain                 [name] DANIEL

## 2017-04-20 NOTE — Discharge Instructions (Signed)
VAC change in one week. Keep leg elevated and wrapped.   Post Anesthesia Home Care Instructions  Activity: Get plenty of rest for the remainder of the day. A responsible individual must stay with you for 24 hours following the procedure.  For the next 24 hours, DO NOT: -Drive a car -Paediatric nurse -Drink alcoholic beverages -Take any medication unless instructed by your physician -Make any legal decisions or sign important papers.  Meals: Start with liquid foods such as gelatin or soup. Progress to regular foods as tolerated. Avoid greasy, spicy, heavy foods. If nausea and/or vomiting occur, drink only clear liquids until the nausea and/or vomiting subsides. Call your physician if vomiting continues.  Special Instructions/Symptoms: Your throat may feel dry or sore from the anesthesia or the breathing tube placed in your throat during surgery. If this causes discomfort, gargle with warm salt water. The discomfort should disappear within 24 hours.  If you had a scopolamine patch placed behind your ear for the management of post- operative nausea and/or vomiting:  1. The medication in the patch is effective for 72 hours, after which it should be removed.  Wrap patch in a tissue and discard in the trash. Wash hands thoroughly with soap and water. 2. You may remove the patch earlier than 72 hours if you experience unpleasant side effects which may include dry mouth, dizziness or visual disturbances. 3. Avoid touching the patch. Wash your hands with soap and water after contact with the patch.

## 2017-04-20 NOTE — Anesthesia Procedure Notes (Signed)
Procedure Name: LMA Insertion Date/Time: 04/20/2017 9:44 AM Performed by: Maryella Shivers, CRNA Pre-anesthesia Checklist: Patient identified, Emergency Drugs available, Suction available and Patient being monitored Patient Re-evaluated:Patient Re-evaluated prior to induction Oxygen Delivery Method: Circle system utilized Preoxygenation: Pre-oxygenation with 100% oxygen Induction Type: IV induction Ventilation: Mask ventilation without difficulty LMA: LMA inserted LMA Size: 5.0 Number of attempts: 1 Airway Equipment and Method: Bite block Placement Confirmation: positive ETCO2 Tube secured with: Tape Dental Injury: Teeth and Oropharynx as per pre-operative assessment

## 2017-04-20 NOTE — Op Note (Signed)
DATE OF OPERATION: 04/20/2017  LOCATION: [facility]  PREOPERATIVE DIAGNOSIS: Left leg wound from mohs resection of skin cancer  POSTOPERATIVE DIAGNOSIS: Same  PROCEDURE: preparation of left leg wound 6 x 6 cm for placement of cellerate and VAC  SURGEON: Claire Sanger Dillingham, DO  EBL: minimal cc  CONDITION: Stable  COMPLICATIONS: None  INDICATION: The patient, Eric Mccarty, is a 62 y.o. male born on April 07, 1955, is here for treatment of a skin cancer on his leg that was resected by mohs technique.   PROCEDURE DETAILS:  The patient was seen prior to surgery and marked.  The IV antibiotics were given. The patient was taken to the operating room and given a general anesthetic. A standard time out was performed and all information was confirmed by those in the room. SCD was placed on the right leg.   The left leg was prepped and draped.  The leg was irritated with saline and antibiotic solution.  The wound bed was looking excellent.  All of the cellerate powder and gel was applied and an adaptic over the 6 x 6 cm area.  KY gel was applied.  The VAC was then placed and there was an excellent seal.  The patient was allowed to wake up and taken to recovery room in stable condition at the end of the case. The family was notified at the end of the case.

## 2017-04-20 NOTE — Anesthesia Preprocedure Evaluation (Signed)
Anesthesia Evaluation  Patient identified by MRN, date of birth, ID band Patient awake    Reviewed: Allergy & Precautions, H&P , NPO status , Patient's Chart, lab work & pertinent test results, reviewed documented beta blocker date and time   History of Anesthesia Complications Negative for: history of anesthetic complications  Airway Mallampati: III  TM Distance: >3 FB Neck ROM: Full    Dental  (+) Dental Advisory Given   Pulmonary neg pulmonary ROS,    Pulmonary exam normal        Cardiovascular hypertension, Pt. on medications and Pt. on home beta blockers Normal cardiovascular exam     Neuro/Psych negative neurological ROS  negative psych ROS   GI/Hepatic   Endo/Other  diabetes, Poorly Controlled, Type 1, Oral Hypoglycemic Agents, Insulin DependentPancrease transplant  Renal/GU Renal InsufficiencyRenal diseaseS/P Renal Transplant      Musculoskeletal   Abdominal   Peds  Hematology   Anesthesia Other Findings   Reproductive/Obstetrics                             Anesthesia Physical  Anesthesia Plan  ASA: III  Anesthesia Plan: General   Post-op Pain Management:    Induction: Intravenous, Rapid sequence and Cricoid pressure planned  PONV Risk Score and Plan: 2 and Ondansetron and Dexamethasone  Airway Management Planned: LMA  Additional Equipment:   Intra-op Plan:   Post-operative Plan: Extubation in OR  Informed Consent: I have reviewed the patients History and Physical, chart, labs and discussed the procedure including the risks, benefits and alternatives for the proposed anesthesia with the patient or authorized representative who has indicated his/her understanding and acceptance.   Dental advisory given  Plan Discussed with: CRNA and Anesthesiologist  Anesthesia Plan Comments:         Anesthesia Quick Evaluation

## 2017-04-20 NOTE — H&P (Signed)
Eric Mccarty is an 62 y.o. male.   Chief Complaint: basal cell carcinoma HPI: The patient is a 62 y.o. yrs old wm here for a wound on his left leg.  He underwent moh's resection of a basal cell carcinoma infiltrative pattern.  He has negative margins.  The wound is ~ 6 x 6 cm in size.   It is clean and no sign of infection.  It was done earlier today.  The muscle fascia is showing.  He has peripheral vascular disease with extensive hemosiderosis.  Pulse present on lower leg and foot.  He has extensive medical problems with kidney pancreas transplant, immunosuppression, DM and anticoagulation  Past Medical History:  Diagnosis Date  . Diabetes mellitus without complication ([place])   . Hx of kidney transplant 2005  . Hyperlipidemia   . Hypertension   . Renal disorder 2005   kidney transplant at [facility]    Past Surgical History:  Procedure Laterality Date  . AV FISTULA PLACEMENT    . COMBINED KIDNEY-PANCREAS TRANSPLANT    . EMBOLECTOMY Left 01/15/2013   Procedure: EMBOLECTOMY Left Brachial Artery With Patch Angioplasty;  Surgeon: Elam Dutch, MD;  Location: [facility];  Service: Vascular;  Laterality: Left;  . EYE SURGERY    . HERNIA REPAIR    . KIDNEY TRANSPLANT    . NEPHRECTOMY TRANSPLANTED ORGAN      Family History  Problem Relation Age of Onset  . Diabetes Mother   . Hypertension Mother   . Hypertension Father    Social History:  reports that  has never smoked. he has never used smokeless tobacco. He reports that he does not drink alcohol or use drugs.  Allergies: No Known Allergies  No medications prior to admission.    No results found for this or any previous visit (from the past 48 hour(s)). No results found.  Review of Systems  Constitutional: Negative.   HENT: Negative.   Eyes: Negative.   Respiratory: Negative.   Cardiovascular: Negative.   Gastrointestinal: Negative.   Genitourinary: Negative.   Musculoskeletal: Negative.   Skin: Negative.    Neurological: Negative.   Psychiatric/Behavioral: Negative.     Height 5\' 7"  (1.702 m), weight 86.2 kg (190 lb). Physical Exam  Constitutional: He is oriented to person, place, and time. He appears well-developed and well-nourished.  HENT:  Head: Normocephalic and atraumatic.  Eyes: EOM are normal. Pupils are equal, round, and reactive to light.  Cardiovascular: Normal rate.  Respiratory: Effort normal.  GI: Soft.  Neurological: He is alert and oriented to person, place, and time.  Skin: Skin is warm.  Psychiatric: He has a normal mood and affect. His behavior is normal. Judgment normal.     Assessment/Plan Plan for debridement of left leg with cellerate and VAC placement.  [place], DO 04/20/2017, 7:23 AM

## 2017-04-20 NOTE — Transfer of Care (Signed)
Immediate Anesthesia Transfer of Care Note  Patient: RC AMISON  Procedure(s) Performed: APPLICATION OF WOUND VAC LEFT LOWER EXTREMITY (Left )  Patient Location: PACU  Anesthesia Type:General  Level of Consciousness: sedated  Airway & Oxygen Therapy: Patient Spontanous Breathing and Patient connected to face mask oxygen  Post-op Assessment: Report given to RN and Post -op Vital signs reviewed and stable  Post vital signs: Reviewed and stable  Last Vitals:  Vitals:   04/20/17 0839 04/20/17 1012  BP: (!) 152/59 (!) 110/52  Pulse: 65 (!) 56  Resp: 16 12  Temp: 36.9 C   SpO2: 100% 100%    Last Pain:  Vitals:   04/20/17 0839  TempSrc: Oral         Complications: No apparent anesthesia complications

## 2017-04-20 NOTE — Interval H&P Note (Signed)
History and Physical Interval Note:  04/20/2017 10:13 AM  Eric Mccarty  has presented today for surgery, with the diagnosis of basal cell carcinoma of skin of left lower extremity  The various methods of treatment have been discussed with the patient and family. After consideration of risks, benefits and other options for treatment, the patient has consented to  Procedure(s): APPLICATION OF WOUND VAC LEFT LOWER EXTREMITY (Left) as a surgical intervention .  The patient's history has been reviewed, patient examined, no change in status, stable for surgery.  I have reviewed the patient's chart and labs.  Questions were answered to the patient's satisfaction.     [name]

## 2017-04-21 ENCOUNTER — Encounter (HOSPITAL_BASED_OUTPATIENT_CLINIC_OR_DEPARTMENT_OTHER): Payer: Self-pay | Admitting: Plastic Surgery

## 2017-05-02 ENCOUNTER — Encounter: Payer: Self-pay | Admitting: *Deleted

## 2017-05-02 NOTE — Congregational Nurse Program (Signed)
[id number]/TCT-Patient/inquired how leg was healing reently has had ca lesion removed and has a wound vac in place.  Answered questions about the wound vac, instructed patient to follow up with md as scheduled.  No other questions  Call was ended. Rhonda Davis,BSN,RN3,CCM CN

## 2017-05-11 ENCOUNTER — Encounter (HOSPITAL_BASED_OUTPATIENT_CLINIC_OR_DEPARTMENT_OTHER): Payer: Self-pay | Admitting: *Deleted

## 2017-05-16 ENCOUNTER — Encounter (HOSPITAL_BASED_OUTPATIENT_CLINIC_OR_DEPARTMENT_OTHER)
Admission: RE | Admit: 2017-05-16 | Discharge: 2017-05-16 | Disposition: A | Discharge status: Home / Self Care | Payer: BLUE CROSS/BLUE SHIELD | Source: Ambulatory Visit | Attending: Plastic Surgery | Admitting: Plastic Surgery

## 2017-05-16 DIAGNOSIS — Z8249 Family history of ischemic heart disease and other diseases of the circulatory system: Secondary | ICD-10-CM | POA: Diagnosis not present

## 2017-05-16 DIAGNOSIS — X58XXXA Exposure to other specified factors, initial encounter: Secondary | ICD-10-CM | POA: Diagnosis not present

## 2017-05-16 DIAGNOSIS — Z940 Kidney transplant status: Secondary | ICD-10-CM | POA: Diagnosis not present

## 2017-05-16 DIAGNOSIS — E119 Type 2 diabetes mellitus without complications: Secondary | ICD-10-CM | POA: Diagnosis not present

## 2017-05-16 DIAGNOSIS — E785 Hyperlipidemia, unspecified: Secondary | ICD-10-CM | POA: Diagnosis not present

## 2017-05-16 DIAGNOSIS — T8189XA Other complications of procedures, not elsewhere classified, initial encounter: Secondary | ICD-10-CM | POA: Diagnosis present

## 2017-05-16 DIAGNOSIS — I10 Essential (primary) hypertension: Secondary | ICD-10-CM | POA: Diagnosis not present

## 2017-05-16 DIAGNOSIS — Z9483 Pancreas transplant status: Secondary | ICD-10-CM | POA: Diagnosis not present

## 2017-05-16 DIAGNOSIS — Z85828 Personal history of other malignant neoplasm of skin: Secondary | ICD-10-CM | POA: Diagnosis not present

## 2017-05-16 DIAGNOSIS — Z833 Family history of diabetes mellitus: Secondary | ICD-10-CM | POA: Diagnosis not present

## 2017-05-16 LAB — BASIC METABOLIC PANEL
Anion gap: 11 (ref 5–15)
BUN: 47 mg/dL — AB (ref 6–20)
CHLORIDE: 100 mmol/L — AB (ref 101–111)
CO2: 25 mmol/L (ref 22–32)
CREATININE: 2.46 mg/dL — AB (ref 0.61–1.24)
Calcium: 9.4 mg/dL (ref 8.9–10.3)
GFR calc Af Amer: 31 mL/min — ABNORMAL LOW (ref >60)
GFR calc non Af Amer: 27 mL/min — ABNORMAL LOW (ref >60)
GLUCOSE: 281 mg/dL — AB (ref 65–99)
Potassium: 4.7 mmol/L (ref 3.5–5.1)
SODIUM: 136 mmol/L (ref 135–145)

## 2017-05-17 ENCOUNTER — Ambulatory Visit: Payer: Self-pay | Admitting: Plastic Surgery

## 2017-05-17 DIAGNOSIS — S81002D Unspecified open wound, left knee, subsequent encounter: Secondary | ICD-10-CM

## 2017-05-17 DIAGNOSIS — S91002D Unspecified open wound, left ankle, subsequent encounter: Principal | ICD-10-CM

## 2017-05-17 DIAGNOSIS — S81802D Unspecified open wound, left lower leg, subsequent encounter: Principal | ICD-10-CM

## 2017-05-17 NOTE — H&P (Signed)
Eric Mccarty is an 62 y.o. male.   Chief Complaint: left lower leg basal cell carcinoma HPI: The patient is a 62 y.o. yrs old wm here for follow up of his left lower leg wound.  He underwent moh's resection of a basal cell carcinoma infiltrative pattern.  He has negative margins.  The wound was 6 x 6 cm and full thickness with visible muscle.  He was taken to the OR 04/20/17 for surgical prep of the wound bed and placement of Cellerate collagen and VAC dressing.  3 weeks post op: He has been doing well over the past week and not had problems with the [facility] dressing  Past Medical History:  Diagnosis Date  . Cancer (HCC)    basal cell ca lt lower leg  . Diabetes mellitus without complication ([place])   . Hx of kidney transplant 2005  . Hyperlipidemia   . Hypertension   . Renal disorder 2005   kidney transplant at [facility]    Past Surgical History:  Procedure Laterality Date  . APPLICATION OF WOUND VAC Left 04/20/2017   Procedure: APPLICATION OF WOUND VAC LEFT LOWER EXTREMITY;  Surgeon: Wallace Going, DO;  Location: [place];  Service: Plastics;  Laterality: Left;  . AV FISTULA PLACEMENT    . COMBINED KIDNEY-PANCREAS TRANSPLANT    . EMBOLECTOMY Left 01/15/2013   Procedure: EMBOLECTOMY Left Brachial Artery With Patch Angioplasty;  Surgeon: Elam Dutch, MD;  Location: [place];  Service: Vascular;  Laterality: Left;  . EYE SURGERY    . HERNIA REPAIR    . KIDNEY TRANSPLANT    . NEPHRECTOMY TRANSPLANTED ORGAN      Family History  Problem Relation Age of Onset  . Diabetes Mother   . Hypertension Mother   . Hypertension Father    Social History:  reports that  has never smoked. he has never used smokeless tobacco. He reports that he does not drink alcohol or use drugs.  Allergies: No Known Allergies   (Not in a hospital admission)  No results found for this or any previous visit (from the past 48 hour(s)). No results found.  Review of Systems   Constitutional: Negative.   Eyes: Negative.   Respiratory: Negative.   Cardiovascular: Negative.   Gastrointestinal: Negative.   Genitourinary: Negative.   Skin: Negative.   Psychiatric/Behavioral: Negative.     There were no vitals taken for this visit. Physical Exam  Constitutional: He is oriented to person, place, and time. He appears well-developed and well-nourished.  HENT:  Head: Normocephalic and atraumatic.  Eyes: Pupils are equal, round, and reactive to light.  Cardiovascular: Normal rate.  Respiratory: Effort normal.  GI: Soft.  Neurological: He is alert and oriented to person, place, and time.  Skin: Skin is warm.  Psychiatric: He has a normal mood and affect. His behavior is normal. Judgment normal.     Assessment/Plan Plan for split thickness skin graft to the left lower leg in the OR next week.  Cannot use the VAC following the skin graft per conversations with [place].  Cannot use Acell to the donor site  Will plan for the patient to remove the [facility] and shower the night before surgery   [place], DO 05/17/2017, 9:59 AM

## 2017-05-17 NOTE — H&P (View-Only) (Signed)
Eric Mccarty is an 62 y.o. male.   Chief Complaint: left lower leg basal cell carcinoma HPI: The patient is a 62 y.o. yrs old wm here for follow up of his left lower leg wound.  He underwent moh's resection of a basal cell carcinoma infiltrative pattern.  He has negative margins.  The wound was 6 x 6 cm and full thickness with visible muscle.  He was taken to the OR 04/20/17 for surgical prep of the wound bed and placement of Cellerate collagen and VAC dressing.  3 weeks post op: He has been doing well over the past week and not had problems with the [facility] dressing  Past Medical History:  Diagnosis Date  . Cancer (HCC)    basal cell ca lt lower leg  . Diabetes mellitus without complication ([place])   . Hx of kidney transplant 2005  . Hyperlipidemia   . Hypertension   . Renal disorder 2005   kidney transplant at [facility]    Past Surgical History:  Procedure Laterality Date  . APPLICATION OF WOUND VAC Left 04/20/2017   Procedure: APPLICATION OF WOUND VAC LEFT LOWER EXTREMITY;  Surgeon: Wallace Going, DO;  Location: [place];  Service: Plastics;  Laterality: Left;  . AV FISTULA PLACEMENT    . COMBINED KIDNEY-PANCREAS TRANSPLANT    . EMBOLECTOMY Left 01/15/2013   Procedure: EMBOLECTOMY Left Brachial Artery With Patch Angioplasty;  Surgeon: Elam Dutch, MD;  Location: [place];  Service: Vascular;  Laterality: Left;  . EYE SURGERY    . HERNIA REPAIR    . KIDNEY TRANSPLANT    . NEPHRECTOMY TRANSPLANTED ORGAN      Family History  Problem Relation Age of Onset  . Diabetes Mother   . Hypertension Mother   . Hypertension Father    Social History:  reports that  has never smoked. he has never used smokeless tobacco. He reports that he does not drink alcohol or use drugs.  Allergies: No Known Allergies   (Not in a hospital admission)  No results found for this or any previous visit (from the past 48 hour(s)). No results found.  Review of Systems   Constitutional: Negative.   Eyes: Negative.   Respiratory: Negative.   Cardiovascular: Negative.   Gastrointestinal: Negative.   Genitourinary: Negative.   Skin: Negative.   Psychiatric/Behavioral: Negative.     There were no vitals taken for this visit. Physical Exam  Constitutional: He is oriented to person, place, and time. He appears well-developed and well-nourished.  HENT:  Head: Normocephalic and atraumatic.  Eyes: Pupils are equal, round, and reactive to light.  Cardiovascular: Normal rate.  Respiratory: Effort normal.  GI: Soft.  Neurological: He is alert and oriented to person, place, and time.  Skin: Skin is warm.  Psychiatric: He has a normal mood and affect. His behavior is normal. Judgment normal.     Assessment/Plan Plan for split thickness skin graft to the left lower leg in the OR next week.  Cannot use the VAC following the skin graft per conversations with [name].  Cannot use Acell to the donor site  Will plan for the patient to remove the [facility] and shower the night before surgery   [place], DO 05/17/2017, 9:59 AM

## 2017-05-18 ENCOUNTER — Ambulatory Visit (HOSPITAL_BASED_OUTPATIENT_CLINIC_OR_DEPARTMENT_OTHER): Payer: BLUE CROSS/BLUE SHIELD | Admitting: Anesthesiology

## 2017-05-18 ENCOUNTER — Ambulatory Visit (HOSPITAL_BASED_OUTPATIENT_CLINIC_OR_DEPARTMENT_OTHER)
Admission: RE | Admit: 2017-05-18 | Discharge: 2017-05-18 | Disposition: A | Discharge status: Home / Self Care | Payer: BLUE CROSS/BLUE SHIELD | Source: Ambulatory Visit | Attending: Plastic Surgery | Admitting: Plastic Surgery

## 2017-05-18 ENCOUNTER — Encounter (HOSPITAL_BASED_OUTPATIENT_CLINIC_OR_DEPARTMENT_OTHER)
Admission: RE | Disposition: A | Discharge status: Home / Self Care | Payer: Self-pay | Source: Ambulatory Visit | Attending: Plastic Surgery

## 2017-05-18 ENCOUNTER — Other Ambulatory Visit: Payer: Self-pay

## 2017-05-18 ENCOUNTER — Encounter (HOSPITAL_BASED_OUTPATIENT_CLINIC_OR_DEPARTMENT_OTHER): Payer: Self-pay | Admitting: Anesthesiology

## 2017-05-18 DIAGNOSIS — T8189XA Other complications of procedures, not elsewhere classified, initial encounter: Secondary | ICD-10-CM | POA: Insufficient documentation

## 2017-05-18 DIAGNOSIS — S81802D Unspecified open wound, left lower leg, subsequent encounter: Secondary | ICD-10-CM

## 2017-05-18 DIAGNOSIS — Z9483 Pancreas transplant status: Secondary | ICD-10-CM | POA: Insufficient documentation

## 2017-05-18 DIAGNOSIS — E119 Type 2 diabetes mellitus without complications: Secondary | ICD-10-CM | POA: Insufficient documentation

## 2017-05-18 DIAGNOSIS — S81002D Unspecified open wound, left knee, subsequent encounter: Secondary | ICD-10-CM

## 2017-05-18 DIAGNOSIS — Z940 Kidney transplant status: Secondary | ICD-10-CM | POA: Insufficient documentation

## 2017-05-18 DIAGNOSIS — E785 Hyperlipidemia, unspecified: Secondary | ICD-10-CM | POA: Insufficient documentation

## 2017-05-18 DIAGNOSIS — I10 Essential (primary) hypertension: Secondary | ICD-10-CM | POA: Insufficient documentation

## 2017-05-18 DIAGNOSIS — X58XXXA Exposure to other specified factors, initial encounter: Secondary | ICD-10-CM | POA: Insufficient documentation

## 2017-05-18 DIAGNOSIS — Z85828 Personal history of other malignant neoplasm of skin: Secondary | ICD-10-CM | POA: Insufficient documentation

## 2017-05-18 DIAGNOSIS — S91002D Unspecified open wound, left ankle, subsequent encounter: Secondary | ICD-10-CM

## 2017-05-18 DIAGNOSIS — Z833 Family history of diabetes mellitus: Secondary | ICD-10-CM | POA: Insufficient documentation

## 2017-05-18 DIAGNOSIS — Z8249 Family history of ischemic heart disease and other diseases of the circulatory system: Secondary | ICD-10-CM | POA: Insufficient documentation

## 2017-05-18 HISTORY — DX: Malignant (primary) neoplasm, unspecified: C80.1

## 2017-05-18 HISTORY — PX: SKIN SPLIT GRAFT: SHX444

## 2017-05-18 HISTORY — PX: WOUND DEBRIDEMENT: SHX247

## 2017-05-18 LAB — GLUCOSE, CAPILLARY
GLUCOSE-CAPILLARY: 84 mg/dL (ref 65–99)
Glucose-Capillary: 96 mg/dL (ref 65–99)

## 2017-05-18 SURGERY — APPLICATION, GRAFT, SKIN, SPLIT-THICKNESS
Anesthesia: General | Site: Leg Lower | Laterality: Left

## 2017-05-18 MED ORDER — MIDAZOLAM HCL 2 MG/2ML IJ SOLN
INTRAMUSCULAR | Status: AC
  Filled 2017-05-18: qty 2

## 2017-05-18 MED ORDER — PROPOFOL 10 MG/ML IV BOLUS
INTRAVENOUS | Status: AC
  Filled 2017-05-18: qty 20

## 2017-05-18 MED ORDER — ONDANSETRON HCL 4 MG/2ML IJ SOLN
INTRAMUSCULAR | Status: DC | PRN
  Administered 2017-05-18: 4 mg via INTRAVENOUS

## 2017-05-18 MED ORDER — PHENYLEPHRINE HCL 10 MG/ML IJ SOLN
INTRAMUSCULAR | Status: DC | PRN
  Administered 2017-05-18: 80 ug via INTRAVENOUS

## 2017-05-18 MED ORDER — OXYCODONE HCL 5 MG/5ML PO SOLN: 5.0000 mg | Freq: Once | ORAL | Status: DC | PRN

## 2017-05-18 MED ORDER — MIDAZOLAM HCL 5 MG/5ML IJ SOLN
INTRAMUSCULAR | Status: DC | PRN
  Administered 2017-05-18 (×2): 1 mg via INTRAVENOUS

## 2017-05-18 MED ORDER — DEXAMETHASONE SODIUM PHOSPHATE 10 MG/ML IJ SOLN
INTRAMUSCULAR | Status: AC
  Filled 2017-05-18: qty 1

## 2017-05-18 MED ORDER — FENTANYL CITRATE (PF) 100 MCG/2ML IJ SOLN: 50.0000 ug | INTRAMUSCULAR | Status: DC | PRN

## 2017-05-18 MED ORDER — FENTANYL CITRATE (PF) 100 MCG/2ML IJ SOLN
INTRAMUSCULAR | Status: DC | PRN
  Administered 2017-05-18: 25 ug via INTRAVENOUS
  Administered 2017-05-18: 50 ug via INTRAVENOUS

## 2017-05-18 MED ORDER — DEXAMETHASONE SODIUM PHOSPHATE 4 MG/ML IJ SOLN
INTRAMUSCULAR | Status: DC | PRN
  Administered 2017-05-18: 10 mg via INTRAVENOUS

## 2017-05-18 MED ORDER — LIDOCAINE HCL (CARDIAC) 20 MG/ML IV SOLN
INTRAVENOUS | Status: DC | PRN
  Administered 2017-05-18: 30 mg via INTRAVENOUS

## 2017-05-18 MED ORDER — PHENYLEPHRINE 40 MCG/ML (10ML) SYRINGE FOR IV PUSH (FOR BLOOD PRESSURE SUPPORT)
PREFILLED_SYRINGE | INTRAVENOUS | Status: AC
Start: 2017-05-18 — End: ?
  Filled 2017-05-18: qty 10

## 2017-05-18 MED ORDER — PROMETHAZINE HCL 25 MG/ML IJ SOLN: 6.2500 mg | INTRAMUSCULAR | Status: DC | PRN

## 2017-05-18 MED ORDER — ONDANSETRON HCL 4 MG/2ML IJ SOLN
INTRAMUSCULAR | Status: AC
  Filled 2017-05-18: qty 2

## 2017-05-18 MED ORDER — LACTATED RINGERS IV SOLN
INTRAVENOUS | Status: DC
  Administered 2017-05-18 (×2): via INTRAVENOUS

## 2017-05-18 MED ORDER — PROPOFOL 10 MG/ML IV BOLUS
INTRAVENOUS | Status: DC | PRN
  Administered 2017-05-18: 200 mg via INTRAVENOUS

## 2017-05-18 MED ORDER — LIDOCAINE-EPINEPHRINE 1 %-1:100000 IJ SOLN
INTRAMUSCULAR | Status: DC | PRN
  Administered 2017-05-18: 10.5 mL

## 2017-05-18 MED ORDER — SCOPOLAMINE 1 MG/3DAYS TD PT72: 1.0000 | MEDICATED_PATCH | Freq: Once | TRANSDERMAL | Status: DC | PRN

## 2017-05-18 MED ORDER — GLYCOPYRROLATE 0.2 MG/ML IJ SOLN
INTRAMUSCULAR | Status: DC | PRN
  Administered 2017-05-18 (×2): 0.1 mg via INTRAVENOUS

## 2017-05-18 MED ORDER — CEFAZOLIN SODIUM-DEXTROSE 2-4 GM/100ML-% IV SOLN
2.0000 g | INTRAVENOUS | Status: AC
  Administered 2017-05-18: 2 g via INTRAVENOUS

## 2017-05-18 MED ORDER — OXYCODONE HCL 5 MG PO TABS: 5.0000 mg | ORAL_TABLET | Freq: Once | ORAL | Status: DC | PRN

## 2017-05-18 MED ORDER — FENTANYL CITRATE (PF) 100 MCG/2ML IJ SOLN: 25.0000 ug | INTRAMUSCULAR | Status: DC | PRN

## 2017-05-18 MED ORDER — FENTANYL CITRATE (PF) 100 MCG/2ML IJ SOLN
INTRAMUSCULAR | Status: AC
  Filled 2017-05-18: qty 2

## 2017-05-18 MED ORDER — MIDAZOLAM HCL 2 MG/2ML IJ SOLN: 1.0000 mg | INTRAMUSCULAR | Status: DC | PRN

## 2017-05-18 MED ORDER — LIDOCAINE 2% (20 MG/ML) 5 ML SYRINGE
INTRAMUSCULAR | Status: AC
  Filled 2017-05-18: qty 5

## 2017-05-18 MED ORDER — SODIUM CHLORIDE 0.9 % IV SOLN
INTRAVENOUS | Status: DC | PRN
  Administered 2017-05-18: 500 mL

## 2017-05-18 SURGICAL SUPPLY — 70 items
ADH SKN CLS APL DERMABOND .7 (GAUZE/BANDAGES/DRESSINGS)
BAG DECANTER FOR FLEXI CONT (MISCELLANEOUS) ×2 IMPLANT
BALL CTTN LRG ABS STRL LF (GAUZE/BANDAGES/DRESSINGS)
BANDAGE ACE 3X5.8 VEL STRL LF (GAUZE/BANDAGES/DRESSINGS) IMPLANT
BANDAGE ACE 4X5 VEL STRL LF (GAUZE/BANDAGES/DRESSINGS) ×6 IMPLANT
BANDAGE ACE 6X5 VEL STRL LF (GAUZE/BANDAGES/DRESSINGS) IMPLANT
BLADE CLIPPER SURG (BLADE) IMPLANT
BLADE DERMATOME SS (BLADE) ×2 IMPLANT
BLADE HEX COATED 2.75 (ELECTRODE) IMPLANT
BLADE SURG 10 STRL SS (BLADE) ×2 IMPLANT
BLADE SURG 15 STRL LF DISP TIS (BLADE) ×2 IMPLANT
BLADE SURG 15 STRL SS (BLADE) ×4
BNDG GAUZE ELAST 4 BULKY (GAUZE/BANDAGES/DRESSINGS) ×4 IMPLANT
COTTONBALL LRG STERILE PKG (GAUZE/BANDAGES/DRESSINGS) IMPLANT
COVER BACK TABLE 60X90IN (DRAPES) ×4 IMPLANT
COVER MAYO STAND STRL (DRAPES) ×4 IMPLANT
DECANTER SPIKE VIAL GLASS SM (MISCELLANEOUS) IMPLANT
DERMABOND ADVANCED (GAUZE/BANDAGES/DRESSINGS)
DERMABOND ADVANCED .7 DNX12 (GAUZE/BANDAGES/DRESSINGS) IMPLANT
DERMACARRIERS GRAFT 1 TO 1.5 (DISPOSABLE)
DRAPE INCISE IOBAN 66X45 STRL (DRAPES) IMPLANT
DRAPE U-SHAPE 76X120 STRL (DRAPES) ×4 IMPLANT
DRESSING DUODERM 4X4 STERILE (GAUZE/BANDAGES/DRESSINGS) IMPLANT
DRSG ADAPTIC 3X8 NADH LF (GAUZE/BANDAGES/DRESSINGS) IMPLANT
DRSG EMULSION OIL 3X3 NADH (GAUZE/BANDAGES/DRESSINGS) IMPLANT
DRSG OPSITE 6X11 MED (GAUZE/BANDAGES/DRESSINGS) IMPLANT
DRSG PAD ABDOMINAL 8X10 ST (GAUZE/BANDAGES/DRESSINGS) ×4 IMPLANT
DRSG TEGADERM 4X10 (GAUZE/BANDAGES/DRESSINGS) IMPLANT
ELECT REM PT RETURN 9FT ADLT (ELECTROSURGICAL) ×4
ELECTRODE REM PT RTRN 9FT ADLT (ELECTROSURGICAL) IMPLANT
GAUZE SPONGE 4X4 12PLY STRL (GAUZE/BANDAGES/DRESSINGS) IMPLANT
GAUZE XEROFORM 5X9 LF (GAUZE/BANDAGES/DRESSINGS) ×4 IMPLANT
GLOVE BIO SURGEON STRL SZ 6.5 (GLOVE) ×5 IMPLANT
GLOVE BIO SURGEON STRL SZ7.5 (GLOVE) ×2 IMPLANT
GLOVE BIO SURGEONS STRL SZ 6.5 (GLOVE) ×3
GOWN STRL REUS W/ TWL LRG LVL3 (GOWN DISPOSABLE) ×4 IMPLANT
GOWN STRL REUS W/ TWL XL LVL3 (GOWN DISPOSABLE) IMPLANT
GOWN STRL REUS W/TWL LRG LVL3 (GOWN DISPOSABLE) ×8
GOWN STRL REUS W/TWL XL LVL3 (GOWN DISPOSABLE) ×8
GRAFT DERMACARRIERS 1 TO 1.5 (DISPOSABLE) IMPLANT
NDL HYPO 25X1 1.5 SAFETY (NEEDLE) ×2 IMPLANT
NEEDLE HYPO 25X1 1.5 SAFETY (NEEDLE) ×4 IMPLANT
NS IRRIG 1000ML POUR BTL (IV SOLUTION) ×4 IMPLANT
PACK BASIN DAY SURGERY FS (CUSTOM PROCEDURE TRAY) ×4 IMPLANT
PADDING CAST ABS 3INX4YD NS (CAST SUPPLIES)
PADDING CAST ABS 4INX4YD NS (CAST SUPPLIES)
PADDING CAST ABS COTTON 3X4 (CAST SUPPLIES) IMPLANT
PADDING CAST ABS COTTON 4X4 ST (CAST SUPPLIES) IMPLANT
PENCIL BUTTON HOLSTER BLD 10FT (ELECTRODE) IMPLANT
SHEET MEDIUM DRAPE 40X70 STRL (DRAPES) ×4 IMPLANT
SPLINT FIBERGLASS 3X35 (CAST SUPPLIES) ×2 IMPLANT
SPLINT FIBERGLASS 4X30 (CAST SUPPLIES) IMPLANT
SPONGE LAP 18X18 X RAY DECT (DISPOSABLE) ×4 IMPLANT
STAPLER VISISTAT 35W (STAPLE) IMPLANT
SURGILUBE 2OZ TUBE FLIPTOP (MISCELLANEOUS) IMPLANT
SUT MON AB 5-0 PS2 18 (SUTURE) IMPLANT
SUT SILK 3 0 SH CR/8 (SUTURE) IMPLANT
SUT SILK 4 0 PS 2 (SUTURE) IMPLANT
SUT VIC AB 5-0 P-3 18X BRD (SUTURE) IMPLANT
SUT VIC AB 5-0 P3 18 (SUTURE)
SUT VIC AB 5-0 PS2 18 (SUTURE) ×2 IMPLANT
SUT VICRYL 4-0 PS2 18IN ABS (SUTURE) ×10 IMPLANT
SYR BULB IRRIGATION 50ML (SYRINGE) ×4 IMPLANT
SYR CONTROL 10ML LL (SYRINGE) ×4 IMPLANT
TOWEL OR 17X24 6PK STRL BLUE (TOWEL DISPOSABLE) ×4 IMPLANT
TRAY DSU PREP LF (CUSTOM PROCEDURE TRAY) ×4 IMPLANT
TUBE CONNECTING 20'X1/4 (TUBING) ×1
TUBE CONNECTING 20X1/4 (TUBING) ×1 IMPLANT
UNDERPAD 30X30 (UNDERPADS AND DIAPERS) ×2 IMPLANT
YANKAUER SUCT BULB TIP NO VENT (SUCTIONS) ×2 IMPLANT

## 2017-05-18 NOTE — Anesthesia Postprocedure Evaluation (Signed)
Anesthesia Post Note  Patient: Eric Mccarty  Procedure(s) Performed: SKIN GRAFT SPLIT THICKNESS FROM LEFT UPPER LEG TO LEFT LOWER LEG WOUND (Left Leg Lower) DEBRIDEMENT LEFT LOWER LEG WOUND     Patient location during evaluation: PACU Anesthesia Type: General Level of consciousness: awake and alert Pain management: pain level controlled Vital Signs Assessment: post-procedure vital signs reviewed and stable Respiratory status: spontaneous breathing, nonlabored ventilation, respiratory function stable and patient connected to nasal cannula oxygen Cardiovascular status: blood pressure returned to baseline and stable Postop Assessment: no apparent nausea or vomiting Anesthetic complications: no    Last Vitals:  Vitals:   05/18/17 1008 05/18/17 1015  BP: (!) 164/70 (!) 170/75  Pulse: 71 70  Resp: (!) 21 (!) 9  Temp: 36.4 C   SpO2: 100% 100%    Last Pain:  Vitals:   05/18/17 1008  TempSrc:   PainSc: Asleep                 Ihan Pat S

## 2017-05-18 NOTE — Discharge Instructions (Signed)
Do not remove dressing. Follow up next Wednesday at 4 pm Post Anesthesia Home Care Instructions  Activity: Get plenty of rest for the remainder of the day. A responsible individual must stay with you for 24 hours following the procedure.  For the next 24 hours, DO NOT: -Drive a car -Paediatric nurse -Drink alcoholic beverages -Take any medication unless instructed by your physician -Make any legal decisions or sign important papers.  Meals: Start with liquid foods such as gelatin or soup. Progress to regular foods as tolerated. Avoid greasy, spicy, heavy foods. If nausea and/or vomiting occur, drink only clear liquids until the nausea and/or vomiting subsides. Call your physician if vomiting continues.  Special Instructions/Symptoms: Your throat may feel dry or sore from the anesthesia or the breathing tube placed in your throat during surgery. If this causes discomfort, gargle with warm salt water. The discomfort should disappear within 24 hours.  If you had a scopolamine patch placed behind your ear for the management of post- operative nausea and/or vomiting:  1. The medication in the patch is effective for 72 hours, after which it should be removed.  Wrap patch in a tissue and discard in the trash. Wash hands thoroughly with soap and water. 2. You may remove the patch earlier than 72 hours if you experience unpleasant side effects which may include dry mouth, dizziness or visual disturbances. 3. Avoid touching the patch. Wash your hands with soap and water after contact with the patch.

## 2017-05-18 NOTE — Anesthesia Procedure Notes (Addendum)
Procedure Name: LMA Insertion Date/Time: 05/18/2017 9:24 AM Performed by: Marrianne Mood, CRNA Pre-anesthesia Checklist: Patient identified, Emergency Drugs available, Suction available, Patient being monitored and Timeout performed Patient Re-evaluated:Patient Re-evaluated prior to induction Oxygen Delivery Method: Circle system utilized Preoxygenation: Pre-oxygenation with 100% oxygen Induction Type: IV induction Ventilation: Mask ventilation without difficulty LMA: LMA inserted LMA Size: 5.0 Number of attempts: 1 Airway Equipment and Method: Bite block Placement Confirmation: positive ETCO2 Tube secured with: Tape Dental Injury: Teeth and Oropharynx as per pre-operative assessment

## 2017-05-18 NOTE — Transfer of Care (Signed)
Immediate Anesthesia Transfer of Care Note  Patient: Eric Mccarty  Procedure(s) Performed: SKIN GRAFT SPLIT THICKNESS FROM LEFT UPPER LEG TO LEFT LOWER LEG WOUND (Left Leg Lower) DEBRIDEMENT LEFT LOWER LEG WOUND  Patient Location: PACU  Anesthesia Type:General  Level of Consciousness: sedated  Airway & Oxygen Therapy: Patient Spontanous Breathing and Patient connected to face mask oxygen  Post-op Assessment: Report given to RN and Post -op Vital signs reviewed and stable  Post vital signs: Reviewed and stable  Last Vitals:  Vitals:   05/18/17 0835  BP: (!) 172/51  Pulse: 64  Resp: 16  Temp: 36.6 C  SpO2: 100%    Last Pain:  Vitals:   05/18/17 0835  TempSrc: Oral         Complications: No apparent anesthesia complications

## 2017-05-18 NOTE — Anesthesia Preprocedure Evaluation (Signed)
Anesthesia Evaluation  Patient identified by MRN, date of birth, ID band Patient awake    Reviewed: Allergy & Precautions, NPO status , Patient's Chart, lab work & pertinent test results  Airway Mallampati: II  TM Distance: >3 FB Neck ROM: Full    Dental no notable dental hx.    Pulmonary neg pulmonary ROS,    Pulmonary exam normal breath sounds clear to auscultation       Cardiovascular hypertension, Normal cardiovascular exam Rhythm:Regular Rate:Normal     Neuro/Psych negative neurological ROS  negative psych ROS   GI/Hepatic negative GI ROS, Neg liver ROS,   Endo/Other  diabetes  Renal/GU negative Renal ROS  negative genitourinary   Musculoskeletal negative musculoskeletal ROS (+)   Abdominal   Peds negative pediatric ROS (+)  Hematology negative hematology ROS (+)   Anesthesia Other Findings   Reproductive/Obstetrics negative OB ROS                             Anesthesia Physical Anesthesia Plan  ASA: III  Anesthesia Plan: General   Post-op Pain Management:    Induction: Intravenous  PONV Risk Score and Plan: 2 and Ondansetron, Dexamethasone and Treatment may vary due to age or medical condition  Airway Management Planned: LMA  Additional Equipment:   Intra-op Plan:   Post-operative Plan: Extubation in OR  Informed Consent: I have reviewed the patients History and Physical, chart, labs and discussed the procedure including the risks, benefits and alternatives for the proposed anesthesia with the patient or authorized representative who has indicated his/her understanding and acceptance.   Dental advisory given  Plan Discussed with: CRNA and Surgeon  Anesthesia Plan Comments:         Anesthesia Quick Evaluation

## 2017-05-18 NOTE — Op Note (Signed)
DATE OF OPERATION: 05/18/2017  LOCATION: [facility]  PREOPERATIVE DIAGNOSIS:   POSTOPERATIVE DIAGNOSIS: Same  PROCEDURE: Excision of 5 x 6 cm wound of nonviable skin and placement of a 5 x 6 cm split thickness skin graft to left leg  SURGEON: Diania Co Sanger Katheryn Culliton, DO  EBL: 5 cc  CONDITION: Stable  COMPLICATIONS: None  INDICATION: The patient, Eric Mccarty, is a 62 y.o. male born on 1955-09-07, is here for treatment of a left leg wound.   PROCEDURE DETAILS:  The patient was seen prior to surgery and marked.  The IV antibiotics were given. The patient was taken to the operating room and given a general anesthetic. A standard time out was performed and all information was confirmed by those in the room. SCD was placed on the right leg.   The left leg and thigh were prepped and draped.  The local was placed in the left thigh for intraoperative hemostasis and postoperative pain control.  The wound was excised of nonviable skin at the edges of the 5 x 6 cm wound with a #15 blade.  The wound was irrigated with antibiotic solution.  The dermatome was set at 02/999 inch and used to obtain a graft from the left thigh.  The xeroform was placed at the dressing on the thigh with a sterile gauze bandage.  The graft was placed on the 5 x 6 cm wound and secured in place with the 4-0 Vicryl.  Slits were placed to prevent a hematoma.  The bolster was applied with the xeroform and cotton balls.  A plantar blocking splint was placed with an Ace wrap. The patient was allowed to wake up and taken to recovery room in stable condition at the end of the case. The family was notified at the end of the case.

## 2017-05-18 NOTE — Interval H&P Note (Signed)
History and Physical Interval Note:  05/18/2017 8:55 AM  Eric Mccarty  has presented today for surgery, with the diagnosis of BASIL CELL CARCINOMA OF SKIN OF LOWER LEFT EXTREMITY INCLUDING HIP  The various methods of treatment have been discussed with the patient and family. After consideration of risks, benefits and other options for treatment, the patient has consented to  Procedure(s): SKIN GRAFT SPLIT THICKNESS TO LEFT LOWER LEG WOUND (Left) as a surgical intervention .  The patient's history has been reviewed, patient examined, no change in status, stable for surgery.  I have reviewed the patient's chart and labs.  Questions were answered to the patient's satisfaction.     Loel Lofty Shawnelle Spoerl

## 2017-05-19 ENCOUNTER — Encounter (HOSPITAL_BASED_OUTPATIENT_CLINIC_OR_DEPARTMENT_OTHER): Payer: Self-pay | Admitting: Plastic Surgery

## 2017-08-24 ENCOUNTER — Other Ambulatory Visit: Payer: Self-pay

## 2017-08-24 ENCOUNTER — Inpatient Hospital Stay (HOSPITAL_COMMUNITY)
Admission: EM | Admit: 2017-08-24 | Discharge: 2017-08-26 | DRG: 683 | Disposition: A | Discharge status: Home / Self Care | Payer: BLUE CROSS/BLUE SHIELD | Attending: Internal Medicine | Admitting: Internal Medicine

## 2017-08-24 ENCOUNTER — Encounter (HOSPITAL_COMMUNITY): Payer: Self-pay | Admitting: *Deleted

## 2017-08-24 DIAGNOSIS — Z79899 Other long term (current) drug therapy: Secondary | ICD-10-CM | POA: Diagnosis not present

## 2017-08-24 DIAGNOSIS — I10 Essential (primary) hypertension: Secondary | ICD-10-CM | POA: Diagnosis present

## 2017-08-24 DIAGNOSIS — E1122 Type 2 diabetes mellitus with diabetic chronic kidney disease: Secondary | ICD-10-CM | POA: Diagnosis present

## 2017-08-24 DIAGNOSIS — Z794 Long term (current) use of insulin: Secondary | ICD-10-CM | POA: Diagnosis not present

## 2017-08-24 DIAGNOSIS — Z8249 Family history of ischemic heart disease and other diseases of the circulatory system: Secondary | ICD-10-CM

## 2017-08-24 DIAGNOSIS — Z7982 Long term (current) use of aspirin: Secondary | ICD-10-CM | POA: Diagnosis not present

## 2017-08-24 DIAGNOSIS — N179 Acute kidney failure, unspecified: Secondary | ICD-10-CM | POA: Diagnosis present

## 2017-08-24 DIAGNOSIS — Z940 Kidney transplant status: Secondary | ICD-10-CM

## 2017-08-24 DIAGNOSIS — Z9483 Pancreas transplant status: Secondary | ICD-10-CM

## 2017-08-24 DIAGNOSIS — E785 Hyperlipidemia, unspecified: Secondary | ICD-10-CM | POA: Diagnosis present

## 2017-08-24 DIAGNOSIS — N184 Chronic kidney disease, stage 4 (severe): Secondary | ICD-10-CM | POA: Diagnosis present

## 2017-08-24 DIAGNOSIS — E860 Dehydration: Secondary | ICD-10-CM | POA: Diagnosis present

## 2017-08-24 DIAGNOSIS — Z833 Family history of diabetes mellitus: Secondary | ICD-10-CM

## 2017-08-24 DIAGNOSIS — N189 Chronic kidney disease, unspecified: Secondary | ICD-10-CM

## 2017-08-24 DIAGNOSIS — E876 Hypokalemia: Secondary | ICD-10-CM | POA: Diagnosis present

## 2017-08-24 DIAGNOSIS — N289 Disorder of kidney and ureter, unspecified: Secondary | ICD-10-CM

## 2017-08-24 DIAGNOSIS — D631 Anemia in chronic kidney disease: Secondary | ICD-10-CM | POA: Diagnosis present

## 2017-08-24 DIAGNOSIS — R197 Diarrhea, unspecified: Secondary | ICD-10-CM | POA: Diagnosis present

## 2017-08-24 DIAGNOSIS — N185 Chronic kidney disease, stage 5: Secondary | ICD-10-CM

## 2017-08-24 DIAGNOSIS — E11649 Type 2 diabetes mellitus with hypoglycemia without coma: Secondary | ICD-10-CM | POA: Diagnosis present

## 2017-08-24 DIAGNOSIS — E119 Type 2 diabetes mellitus without complications: Secondary | ICD-10-CM

## 2017-08-24 DIAGNOSIS — E1165 Type 2 diabetes mellitus with hyperglycemia: Secondary | ICD-10-CM

## 2017-08-24 LAB — CBC
HEMATOCRIT: 29.4 % — AB (ref 39.0–52.0)
Hemoglobin: 9.4 g/dL — ABNORMAL LOW (ref 13.0–17.0)
MCH: 28.6 pg (ref 26.0–34.0)
MCHC: 32 g/dL (ref 30.0–36.0)
MCV: 89.4 fL (ref 78.0–100.0)
PLATELETS: 215 10*3/uL (ref 150–400)
RBC: 3.29 MIL/uL — ABNORMAL LOW (ref 4.22–5.81)
RDW: 15 % (ref 11.5–15.5)
WBC: 6.3 10*3/uL (ref 4.0–10.5)

## 2017-08-24 LAB — URINALYSIS, ROUTINE W REFLEX MICROSCOPIC
BILIRUBIN URINE: NEGATIVE
Glucose, UA: NEGATIVE mg/dL
Hgb urine dipstick: NEGATIVE
KETONES UR: NEGATIVE mg/dL
Leukocytes, UA: NEGATIVE
Nitrite: NEGATIVE
PH: 5 (ref 5.0–8.0)
Protein, ur: NEGATIVE mg/dL
Specific Gravity, Urine: 1.006 (ref 1.005–1.030)

## 2017-08-24 LAB — COMPREHENSIVE METABOLIC PANEL
ALK PHOS: 79 U/L (ref 38–126)
ALT: 21 U/L (ref 17–63)
AST: 32 U/L (ref 15–41)
Albumin: 3.5 g/dL (ref 3.5–5.0)
Anion gap: 7 (ref 5–15)
BILIRUBIN TOTAL: 0.5 mg/dL (ref 0.3–1.2)
BUN: 47 mg/dL — AB (ref 6–20)
CALCIUM: 8.4 mg/dL — AB (ref 8.9–10.3)
CO2: 19 mmol/L — ABNORMAL LOW (ref 22–32)
CREATININE: 3.26 mg/dL — AB (ref 0.61–1.24)
Chloride: 111 mmol/L (ref 101–111)
GFR calc Af Amer: 22 mL/min — ABNORMAL LOW (ref >60)
GFR calc non Af Amer: 19 mL/min — ABNORMAL LOW (ref >60)
GLUCOSE: 59 mg/dL — AB (ref 65–99)
POTASSIUM: 3.6 mmol/L (ref 3.5–5.1)
Sodium: 137 mmol/L (ref 135–145)
Total Protein: 5.7 g/dL — ABNORMAL LOW (ref 6.5–8.1)

## 2017-08-24 LAB — LIPASE, BLOOD: Lipase: 29 U/L (ref 11–51)

## 2017-08-24 LAB — CBG MONITORING, ED
GLUCOSE-CAPILLARY: 92 mg/dL (ref 65–99)
Glucose-Capillary: 56 mg/dL — ABNORMAL LOW (ref 65–99)

## 2017-08-24 MED ORDER — ATORVASTATIN CALCIUM 10 MG PO TABS
10.0000 mg | ORAL_TABLET | Freq: Every day | ORAL | Status: DC
  Administered 2017-08-24 – 2017-08-25 (×2): 10 mg via ORAL
  Filled 2017-08-24 (×3): qty 1

## 2017-08-24 MED ORDER — INSULIN GLARGINE 100 UNIT/ML ~~LOC~~ SOLN
10.0000 [IU] | Freq: Every day | SUBCUTANEOUS | Status: DC
  Administered 2017-08-25: 10 [IU] via SUBCUTANEOUS
  Filled 2017-08-24: qty 0.1

## 2017-08-24 MED ORDER — LABETALOL HCL 200 MG PO TABS
200.0000 mg | ORAL_TABLET | Freq: Every morning | ORAL | Status: DC
  Administered 2017-08-25 – 2017-08-26 (×2): 200 mg via ORAL
  Filled 2017-08-24 (×3): qty 1

## 2017-08-24 MED ORDER — CLONIDINE HCL 0.2 MG PO TABS
0.3000 mg | ORAL_TABLET | Freq: Two times a day (BID) | ORAL | Status: DC
  Administered 2017-08-24 – 2017-08-26 (×4): 0.3 mg via ORAL
  Filled 2017-08-24 (×4): qty 1

## 2017-08-24 MED ORDER — LABETALOL HCL 200 MG PO TABS
400.0000 mg | ORAL_TABLET | Freq: Every evening | ORAL | Status: DC
  Administered 2017-08-24 – 2017-08-25 (×2): 400 mg via ORAL
  Filled 2017-08-24 (×3): qty 2

## 2017-08-24 MED ORDER — LABETALOL HCL 200 MG PO TABS: 200.0000 mg | ORAL_TABLET | ORAL | Status: DC

## 2017-08-24 MED ORDER — SODIUM CHLORIDE 0.9 % IV BOLUS
2000.0000 mL | Freq: Once | INTRAVENOUS | Status: AC
  Administered 2017-08-24: 2000 mL via INTRAVENOUS

## 2017-08-24 MED ORDER — ONDANSETRON HCL 4 MG PO TABS: 4.0000 mg | ORAL_TABLET | Freq: Four times a day (QID) | ORAL | Status: DC | PRN

## 2017-08-24 MED ORDER — ACETAMINOPHEN 650 MG RE SUPP: 650.0000 mg | Freq: Four times a day (QID) | RECTAL | Status: DC | PRN

## 2017-08-24 MED ORDER — METRONIDAZOLE IN NACL 5-0.79 MG/ML-% IV SOLN
500.0000 mg | Freq: Three times a day (TID) | INTRAVENOUS | Status: DC
  Administered 2017-08-24 – 2017-08-25 (×3): 500 mg via INTRAVENOUS
  Filled 2017-08-24 (×3): qty 100

## 2017-08-24 MED ORDER — INSULIN ASPART 100 UNIT/ML ~~LOC~~ SOLN
0.0000 [IU] | Freq: Three times a day (TID) | SUBCUTANEOUS | Status: DC
Start: 2017-08-25 — End: 2017-08-26
  Administered 2017-08-25: 2 [IU] via SUBCUTANEOUS
  Administered 2017-08-26: 1 [IU] via SUBCUTANEOUS

## 2017-08-24 MED ORDER — SENNOSIDES-DOCUSATE SODIUM 8.6-50 MG PO TABS
1.0000 | ORAL_TABLET | Freq: Two times a day (BID) | ORAL | Status: DC
  Administered 2017-08-25: 1 via ORAL
  Filled 2017-08-24: qty 1

## 2017-08-24 MED ORDER — SODIUM CHLORIDE 0.9 % IV SOLN: INTRAVENOUS | Status: DC

## 2017-08-24 MED ORDER — TACROLIMUS 1 MG PO CAPS
2.0000 mg | ORAL_CAPSULE | Freq: Two times a day (BID) | ORAL | Status: DC
  Administered 2017-08-24 – 2017-08-26 (×4): 2 mg via ORAL
  Filled 2017-08-24 (×5): qty 2

## 2017-08-24 MED ORDER — ONDANSETRON HCL 4 MG/2ML IJ SOLN: 4.0000 mg | Freq: Four times a day (QID) | INTRAMUSCULAR | Status: DC | PRN

## 2017-08-24 MED ORDER — SODIUM CHLORIDE 0.9 % IV SOLN
2.0000 g | INTRAVENOUS | Status: DC
  Administered 2017-08-24: 2 g via INTRAVENOUS
  Filled 2017-08-24 (×2): qty 20

## 2017-08-24 MED ORDER — HEPARIN SODIUM (PORCINE) 5000 UNIT/ML IJ SOLN
5000.0000 [IU] | Freq: Three times a day (TID) | INTRAMUSCULAR | Status: DC
  Administered 2017-08-24 – 2017-08-26 (×5): 5000 [IU] via SUBCUTANEOUS
  Filled 2017-08-24 (×6): qty 1

## 2017-08-24 MED ORDER — CALCITRIOL 0.25 MCG PO CAPS
0.2500 ug | ORAL_CAPSULE | Freq: Every day | ORAL | Status: DC
  Administered 2017-08-25 – 2017-08-26 (×2): 0.25 ug via ORAL
  Filled 2017-08-24 (×2): qty 1

## 2017-08-24 MED ORDER — ACETAMINOPHEN 325 MG PO TABS: 650.0000 mg | ORAL_TABLET | Freq: Four times a day (QID) | ORAL | Status: DC | PRN

## 2017-08-24 MED ORDER — LACTATED RINGERS IV SOLN
INTRAVENOUS | Status: DC
  Administered 2017-08-25 (×2): via INTRAVENOUS

## 2017-08-24 MED ORDER — MYCOPHENOLATE MOFETIL 250 MG PO CAPS
500.0000 mg | ORAL_CAPSULE | Freq: Three times a day (TID) | ORAL | Status: DC
  Administered 2017-08-24 – 2017-08-26 (×5): 500 mg via ORAL
  Filled 2017-08-24 (×7): qty 2

## 2017-08-24 MED ORDER — AMLODIPINE BESYLATE 10 MG PO TABS
10.0000 mg | ORAL_TABLET | Freq: Every day | ORAL | Status: DC
  Administered 2017-08-24 – 2017-08-25 (×2): 10 mg via ORAL
  Filled 2017-08-24: qty 1
  Filled 2017-08-24: qty 2

## 2017-08-24 MED ORDER — ASPIRIN EC 81 MG PO TBEC
81.0000 mg | DELAYED_RELEASE_TABLET | Freq: Every day | ORAL | Status: DC
  Administered 2017-08-25 – 2017-08-26 (×2): 81 mg via ORAL
  Filled 2017-08-24 (×2): qty 1

## 2017-08-24 MED ORDER — AMITRIPTYLINE HCL 25 MG PO TABS
25.0000 mg | ORAL_TABLET | Freq: Every day | ORAL | Status: DC
  Administered 2017-08-24 – 2017-08-25 (×2): 25 mg via ORAL
  Filled 2017-08-24 (×2): qty 1

## 2017-08-24 NOTE — Consult Note (Signed)
Eric Mccarty Admit Date: 08/24/2017 08/24/2017 Eric Mccarty Requesting Physician:  Eric Mccarty  Reason for Consult:  AoCKD, Renal Transplant Patient HPI:  21M presented to ED today with concern for decreased UOP in context of N/V followed by diarrhea.    PMH Incudes:  S/p KP Tplt 2002 at [facility]. 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  The wife's symptoms abated after 4 days but the patient's symptoms continued.  The nausea and vomiting eventually resolved but the diarrhea has persisted having anywhere from 1-2 episodes daily to much more frequently per day.  He denies ever being bloody.  He has felt somewhat dehydrated but has tried to push his fluids.  Stool is described as mushy tan/brown.  He has used Imodium without relief.  He presented to the emergency room partially and frustration given its failure to resolve and at the direction of our office for evaluation, likely hydration.    No nonsteroidal use.  He has been taking his transplant medications consistently. No fevers, dysurnia, tenderness over renal transplant.    In ED labs show SCr of 3.26, BUN 47, K 3.6, HCO3 19, Hb 9.4 (last outpt Hb in 11s).  PLTS WNL and similar to previous outpt PLT of 273. LFTs WNL.  Lipase WNL.  No imaging. Rec 2L NS in ED.    No BMs during his time in the emergency room thus far.   Creatinine, Ser (mg/dL)  Date Value  08/24/2017 3.26 (H)  05/16/2017 2.46 (H)  04/13/2017 2.45 (H)  12/23/2016 2.07 (H)  01/16/2013 1.79 (H)  01/15/2013 1.98 (H)  07/14/2009 1.71 (H)  07/13/2009 1.8 (H)  07/13/2009 1.81 (H)  ]  ROS NSAIDS: denies IV Contrast no exposure TMP/SMX  no exposure Hypotension not present Balance of 12 systems is negative w/ exceptions as above  PMH  Past Medical History:  Diagnosis Date  . Cancer (HCC)    basal cell ca lt lower leg  . Diabetes mellitus without complication ([place])   . Hx of kidney transplant 2005  . Hyperlipidemia   . Hypertension   . Renal disorder 2005   kidney transplant at [facility]  Past Surgical History:  Procedure Laterality Date  . APPLICATION OF WOUND VAC Left 04/20/2017   Procedure: APPLICATION OF WOUND VAC LEFT LOWER EXTREMITY;  Surgeon: Wallace Going, DO;  Location: [place];  Service: Plastics;  Laterality: Left;  . AV FISTULA PLACEMENT    . COMBINED KIDNEY-PANCREAS TRANSPLANT    . EMBOLECTOMY Left 01/15/2013   Procedure: EMBOLECTOMY Left Brachial Artery With Patch Angioplasty;  Surgeon: Elam Dutch, MD;  Location: [place];  Service: Vascular;  Laterality: Left;  . EYE SURGERY    . HERNIA REPAIR    . KIDNEY TRANSPLANT    . NEPHRECTOMY TRANSPLANTED ORGAN    . SKIN SPLIT GRAFT Left 05/18/2017   Procedure: SKIN GRAFT SPLIT THICKNESS FROM LEFT UPPER LEG TO LEFT LOWER LEG WOUND;  Surgeon: Wallace Going, DO;  Location: [place];  Service: Plastics;  Laterality: Left;  . WOUND DEBRIDEMENT  05/18/2017   Procedure: DEBRIDEMENT LEFT LOWER LEG WOUND;  Surgeon: Marla Roe,  Loel Lofty, DO;  Location: [place];  Service: Plastics;;   FH  Family History  Problem Relation Age of Onset  . Diabetes Mother   . Hypertension Mother   . Hypertension Father    SH  reports that he has never smoked. He has never used smokeless tobacco. He reports that he does not drink alcohol or use drugs. Allergies No Known Allergies Home medications Prior to Admission medications   Medication Sig Start Date End Date Taking? Authorizing Provider  amitriptyline (ELAVIL) 25 MG tablet Take 25 mg by mouth at bedtime.   Yes [provider]  amLODipine  (NORVASC) 10 MG tablet Take 10 mg by mouth at bedtime. 08/07/17  Yes [provider]  aspirin 81 MG tablet Take 81 mg by mouth daily.   Yes [provider]  atorvastatin (LIPITOR) 10 MG tablet Take 10 mg by mouth daily at 6 PM.    Yes [provider]  calcitRIOL (ROCALTROL) 0.25 MCG capsule Take 0.25 mcg by mouth daily.   Yes [provider]  cloNIDine (CATAPRES) 0.3 MG tablet Take 0.3 mg by mouth See admin instructions. Takes either three times daily or twice a daily depending on blood pressure   Yes [provider]  furosemide (LASIX) 20 MG tablet Take 60 mg by mouth 2 (two) times daily.    Yes [provider]  glipiZIDE (GLUCOTROL) 10 MG tablet Take 10 mg by mouth 2 (two) times daily before a meal.   Yes [provider]  insulin glargine (LANTUS) 100 UNIT/ML injection Inject 18 Units into the skin at bedtime.   Yes [provider]  labetalol (NORMODYNE) 200 MG tablet Take 200-400 mg by mouth See admin instructions. Take 1 tablet in the morning and take 2 tablets in the evening    Yes [provider]  mycophenolate (CELLCEPT) 500 MG tablet Take 500 mg by mouth 3 (three) times daily.    Yes [provider]  pioglitazone (ACTOS) 45 MG tablet Take 45 mg by mouth daily.   Yes [provider]  sennosides-docusate sodium (SENOKOT-S) 8.6-50 MG tablet Take 1 tablet by mouth 2 (two) times daily. Reported on 03/20/2015   Yes [provider]  sorbitol 70 % solution Take 15 mLs by mouth daily as needed (constipation).    Yes [provider]  tacrolimus (PROGRAF) 1 MG capsule Take 2 mg by mouth 2 (two) times daily.   Yes [provider]  hydrALAZINE (APRESOLINE) 25 MG tablet Take 25 mg by mouth 2 (two) times daily. 07/26/17   [provider]    Current Medications Scheduled Meds: Continuous Infusions: PRN Meds:.  CBC Recent Labs  Lab 08/24/17 1739  WBC 6.3  HGB 9.4*   HCT 29.4*  MCV 89.4  PLT 563   Basic Metabolic Panel Recent Labs  Lab 08/24/17 1739  NA 137  K 3.6  CL 111  CO2 19*  GLUCOSE 59*  BUN 47*  CREATININE 3.26*  CALCIUM 8.4*    Physical Exam  Blood pressure (!) 153/61, pulse 64, temperature 98.5 F (36.9 C), temperature source Oral, resp. rate 18, SpO2 99 %. GEN: no acute distress ENT: NCAT EYES: EOMI CV: RRR no mgr, nl s1s2 PULM: CTAB nl WOB ABD: s/nt/nd, protuberant, normal bs SKIN: no rashes/lesions.  EXT:no LEE NEURO: nonfocal   Assessment 39M with AoCKD in setting of renal transplant from 2002 on Tac/MMF; BL SCr 2.5-2.8; has persistent diarrhea after likely food poisoning.  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 Persistent diarrhea, nonbloody, after food poisoning 3. HTN, lasix held for now; not on RAAS inhibitor 4. Immunosuppression Tac/MMF 5. DM2 6. Anemia, Hb in 11s-12s at baseline  Plan 1. Cont hydration but would use LR instead of NS here 2. GI w/u per TRH started 3. Further workup if doesn't return to baseline renal function wit hydration 4. Daily weights, Daily Renal Panel, Strict I/Os, Avoid nephrotoxins (NSAIDs, judicious IV Contrast)    Pearson Grippe MD [phone] pgr 08/24/2017, 8:14 PM

## 2017-08-24 NOTE — H&P (Signed)
History and Physical    Eric Mccarty IRJ:[id number] DOB: [birth year] DOA: 08/24/2017  PCP: Corliss Parish, MD  Patient coming from: Home  I have personally briefly reviewed patient's old medical records in [place]  Chief Complaint: Diarrhea, decreased UOP  HPI: Eric Mccarty is a 62 y.o. 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  Patient and wife both developed sudden onset of vomiting and diarrhea on 07/30/17 (almost to the hour at 2am).  They state they are suspicious of "food poisoning" shared meal earlier in the day and both had fried chicken.  Wife (who is NOT on immunosuppressives and has normal immune system), symptoms lasted 4 days and resolved.  Patient on the other hand has had a more prolonged course:  Vomiting stopped several weeks ago, but still having diarrhea every other day or so.  3 episodes of diarrhea today.  Has noted decreased UOP so presents to ED.   ED Course: Creat 3.2 up from 2.8 a month ago (2.8 not in Epic but is in nephrology's computer system).  Nephrology consulted and hospitalist asked to admit.  No kidney transplant pain or tenderness.   Review of Systems: As per HPI otherwise 10 point review of systems negative.   Past Medical History:  Diagnosis Date  . Cancer (HCC)    basal cell ca lt lower leg  . Diabetes mellitus without complication ([place])   . Hx of kidney transplant 2005  . Hyperlipidemia   . Hypertension   . Renal disorder 2005   kidney transplant at [facility]    Past Surgical History:  Procedure Laterality Date  . APPLICATION OF WOUND VAC Left 04/20/2017   Procedure: APPLICATION OF WOUND VAC LEFT LOWER EXTREMITY;  Surgeon: Wallace Going, DO;  Location: [place];  Service: Plastics;  Laterality: Left;  . AV FISTULA PLACEMENT    . COMBINED KIDNEY-PANCREAS TRANSPLANT    . EMBOLECTOMY Left 01/15/2013   Procedure:  EMBOLECTOMY Left Brachial Artery With Patch Angioplasty;  Surgeon: Elam Dutch, MD;  Location: [place];  Service: Vascular;  Laterality: Left;  . EYE SURGERY    . HERNIA REPAIR    . KIDNEY TRANSPLANT    . NEPHRECTOMY TRANSPLANTED ORGAN    . SKIN SPLIT GRAFT Left 05/18/2017   Procedure: SKIN GRAFT SPLIT THICKNESS FROM LEFT UPPER LEG TO LEFT LOWER LEG WOUND;  Surgeon: Wallace Going, DO;  Location: [place];  Service: Plastics;  Laterality: Left;  . WOUND DEBRIDEMENT  05/18/2017   Procedure: DEBRIDEMENT LEFT LOWER LEG WOUND;  Surgeon: Wallace Going, DO;  Location: [place];  Service: Plastics;;     reports that he has never smoked. He has never used smokeless tobacco. He reports that he does not drink alcohol or use drugs.  No Known Allergies  Family History  Problem Relation Age of Onset  . Diabetes Mother   . Hypertension Mother   . Hypertension Father      Prior to Admission medications   Medication Sig Start Date End Date Taking? Authorizing Provider  amitriptyline (ELAVIL) 25 MG tablet Take 25 mg by mouth at bedtime.   Yes [provider]  amLODipine (NORVASC) 10 MG tablet Take 10 mg by mouth at bedtime. 08/07/17  Yes [provider]  aspirin 81 MG tablet Take 81 mg by mouth daily.   Yes [provider]  atorvastatin (LIPITOR) 10 MG tablet Take 10 mg  by mouth daily at 6 PM.    Yes [provider]  calcitRIOL (ROCALTROL) 0.25 MCG capsule Take 0.25 mcg by mouth daily.   Yes [provider]  cloNIDine (CATAPRES) 0.3 MG tablet Take 0.3 mg by mouth See admin instructions. Takes either three times daily or twice a daily depending on blood pressure   Yes [provider]  furosemide (LASIX) 20 MG tablet Take 60 mg by mouth 2 (two) times daily.    Yes [provider]  glipiZIDE (GLUCOTROL) 10 MG tablet Take 10 mg by mouth 2 (two) times daily before a meal.   Yes [provider]  insulin glargine (LANTUS) 100 UNIT/ML injection Inject 18 Units into the skin at bedtime.   Yes [provider]  labetalol (NORMODYNE) 200 MG tablet Take 200-400 mg by mouth See admin instructions. Take 1 tablet in the morning and take 2 tablets in the evening    Yes [provider]  mycophenolate (CELLCEPT) 500 MG tablet Take 500 mg by mouth 3 (three) times daily.    Yes [provider]  pioglitazone (ACTOS) 45 MG tablet Take 45 mg by mouth daily.   Yes [provider]  sennosides-docusate sodium (SENOKOT-S) 8.6-50 MG tablet Take 1 tablet by mouth 2 (two) times daily. Reported on 03/20/2015   Yes [provider]  sorbitol 70 % solution Take 15 mLs by mouth daily as needed (constipation).    Yes [provider]  tacrolimus (PROGRAF) 1 MG capsule Take 2 mg by mouth 2 (two) times daily.   Yes [provider]  hydrALAZINE (APRESOLINE) 25 MG tablet Take 25 mg by mouth 2 (two) times daily. 07/26/17   [provider]    Physical Exam: Vitals:   08/24/17 1815 08/24/17 1900 08/24/17 1945 08/24/17 2030  BP: (!) 164/73 (!) 181/83 (!) 192/78 (!) 190/80  Pulse: 63 71 71 79  Resp: 11 13 14 16   Temp:      TempSrc:      SpO2: 100% 100% 100% 100%    Constitutional: NAD, calm, comfortable Eyes: PERRL, lids and conjunctivae normal ENMT: Mucous membranes are moist. Posterior pharynx clear of any exudate or lesions.Normal dentition.  Neck: normal, supple, no masses, no thyromegaly Respiratory: clear to auscultation bilaterally, no wheezing, no crackles. Normal respiratory effort. No accessory muscle use.  Cardiovascular: Regular rate and rhythm, no murmurs / rubs / gallops. No extremity edema. 2+ pedal pulses. No carotid bruits.  Abdomen: no tenderness, no masses palpated. No hepatosplenomegaly. Bowel sounds positive.  Musculoskeletal: no clubbing / cyanosis. No joint deformity upper and lower extremities. Good ROM, no  contractures. Normal muscle tone.  Skin: no rashes, lesions, ulcers. No induration Neurologic: CN 2-12 grossly intact. Sensation intact, DTR normal. Strength 5/5 in all 4.  Psychiatric: Normal judgment and insight. Alert and oriented x 3. Normal mood.    Labs on Admission: I have personally reviewed following labs and imaging studies  CBC: Recent Labs  Lab 08/24/17 1739  WBC 6.3  HGB 9.4*  HCT 29.4*  MCV 89.4  PLT 536   Basic Metabolic Panel: Recent Labs  Lab 08/24/17 1739  NA 137  K 3.6  CL 111  CO2 19*  GLUCOSE 59*  BUN 47*  CREATININE 3.26*  CALCIUM 8.4*   GFR: CrCl cannot be calculated (Unknown ideal weight.). Liver Function Tests: Recent Labs  Lab 08/24/17 1739  AST 32  ALT 21  ALKPHOS 79  BILITOT 0.5  PROT 5.7*  ALBUMIN 3.5  Recent Labs  Lab 08/24/17 1739  LIPASE 29   No results for input(s): AMMONIA in the last 168 hours. Coagulation Profile: No results for input(s): INR, PROTIME in the last 168 hours. Cardiac Enzymes: No results for input(s): CKTOTAL, CKMB, CKMBINDEX, TROPONINI in the last 168 hours. BNP (last 3 results) No results for input(s): PROBNP in the last 8760 hours. HbA1C: No results for input(s): HGBA1C in the last 72 hours. CBG: Recent Labs  Lab 08/24/17 1741 08/24/17 2013  GLUCAP 56* 92   Lipid Profile: No results for input(s): CHOL, HDL, LDLCALC, TRIG, CHOLHDL, LDLDIRECT in the last 72 hours. Thyroid Function Tests: No results for input(s): TSH, T4TOTAL, FREET4, T3FREE, THYROIDAB in the last 72 hours. Anemia Panel: No results for input(s): VITAMINB12, FOLATE, FERRITIN, TIBC, IRON, RETICCTPCT in the last 72 hours. Urine analysis:    Component Value Date/Time   COLORURINE YELLOW 07/13/2009 [place] 07/13/2009 1851   LABSPEC 1.017 07/13/2009 1851   PHURINE 6.5 07/13/2009 1851   GLUCOSEU NEGATIVE 07/13/2009 1851   HGBUR SMALL (A) 07/13/2009 1851   BILIRUBINUR NEGATIVE 07/13/2009 1851   KETONESUR TRACE  (A) 07/13/2009 1851   PROTEINUR >300 (A) 07/13/2009 1851   UROBILINOGEN 0.2 07/13/2009 1851   NITRITE NEGATIVE 07/13/2009 1851   LEUKOCYTESUR SMALL (A) 07/13/2009 1851    Radiological Exams on Admission: No results found.  EKG: Independently reviewed.  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 AKI - mild, probably dehydration secondary to diarrhea 1. Hold lasix 2. IVF: NS at 100 cc/hr 3. Repeat BMP in AM 4. Nephrology already consulted, see their note but from EDP report they suspect dehydration most likely as well 5. Check UA, check FeNa 2. Diarrhea - 1. Probably infectious, suspect prolonged course of salmonella food poisoning due to his immunosuppressed status 2. Empiric rocephin / flagyl - pharm to review for renal dosing / interactions 3. GI pathogen pnl pending (would confirm above) 4. C.Diff quick scan pending 3. Renal and pancreas transplant status - continue anti-rejection meds 4. HTN - 1. Continue home BP meds 5. DM - 1. Lantus 10 QHs 2. Hold home PO hypoglycemics 3. Sensitive SSI AC for now  DVT prophylaxis: Heparin [place] Code Status: Full Family Communication: Wife at bedside Disposition Plan: Home after admit Consults called: Nephrology Admission status: Admit to inpatient   Etta Quill DO [facility] Pager [phone]  If 7AM-7PM, please contact day team taking care of patient [email or web address] Password [email or web address]  08/24/2017, 9:03 PM

## 2017-08-24 NOTE — ED Provider Notes (Addendum)
[place] EMERGENCY DEPARTMENT Provider Note   CSN: [id number] Arrival date & time: 08/24/17  1646     History   Chief Complaint Chief Complaint  Patient presents with  . Diarrhea    HPI Eric Mccarty is a 62 y.o. male.patient had vomiting and diarrhea starting 07/30/2017. Vomiting stopped several weeks ago. He reports diarrhea approximately every other day. He's had 3 episodes of diarrhea today. No other associated symptoms. He reports that he urinates only proximal may once per day. He was advised to come here for IV hydration and possible testing for Clostridium difficile. No recent travel or antibiotics. No fever. No abdominal pain. No other associated symptoms. Nothing makes symptoms better or worse. Denies nausea presently  HPI  Past Medical History:  Diagnosis Date  . Cancer (HCC)    basal cell ca lt lower leg  . Diabetes mellitus without complication ([place])   . Hx of kidney transplant 2005  . Hyperlipidemia   . Hypertension   . Renal disorder 2005   kidney transplant at [facility]  pancreas transplant  Patient Active Problem List   Diagnosis Date Noted  . 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 APPLICATION OF WOUND VAC Left 04/20/2017   Procedure: APPLICATION OF WOUND VAC LEFT LOWER EXTREMITY;  Surgeon: [place] Going, DO;  Location: [place];  Service: Plastics;  Laterality: Left;  . AV FISTULA PLACEMENT    . COMBINED KIDNEY-PANCREAS TRANSPLANT    . EMBOLECTOMY Left 01/15/2013   Procedure: EMBOLECTOMY Left Brachial Artery With Patch Angioplasty;  Surgeon: Elam Dutch, MD;  Location: [place];  Service: Vascular;  Laterality: Left;  . EYE SURGERY    . HERNIA REPAIR    . KIDNEY TRANSPLANT    . NEPHRECTOMY TRANSPLANTED ORGAN    . SKIN SPLIT GRAFT Left 05/18/2017   Procedure: SKIN GRAFT SPLIT THICKNESS FROM LEFT UPPER LEG TO LEFT LOWER LEG WOUND;  Surgeon: Wallace Going, DO;  Location: [place];  Service: Plastics;  Laterality: Left;  . WOUND DEBRIDEMENT  05/18/2017   Procedure: DEBRIDEMENT LEFT LOWER LEG WOUND;  Surgeon: Wallace Going, DO;  Location: [place];  Service: Plastics;;        Home Medications    Prior to Admission medications   Medication Sig Start Date End Date Taking? Authorizing Provider  amitriptyline (ELAVIL) 25 MG tablet Take 25 mg by mouth at bedtime.    [provider]  aspirin 81 MG tablet Take 81 mg by mouth daily.    [provider]  atorvastatin (LIPITOR) 10 MG tablet Take 10 mg by mouth daily.    [provider]  calcitRIOL (ROCALTROL) 0.25 MCG capsule Take 0.25 mcg by mouth daily.    [provider]  cloNIDine (CATAPRES) 0.3 MG tablet Take 0.3 mg by mouth See admin instructions. Takes either three times daily or twice a daily depending on blood pressure    [provider]  furosemide (LASIX) 20 MG tablet Take 60 mg by mouth 2 (two) times daily.     [provider]  glipiZIDE (GLUCOTROL) 10 MG tablet Take 10 mg by mouth 2 (two) times daily before a meal.    [provider]  insulin glargine (LANTUS) 100 UNIT/ML injection Inject 18 Units into the skin at bedtime.    [provider]  labetalol (NORMODYNE) 200 MG tablet Take 400 mg by mouth 2 (  two) times daily. Take 1 tablet in the morning and take 2 tablets in the evening     [provider]  mycophenolate (CELLCEPT) 500 MG tablet Take 500 mg by mouth 3 (three) times daily.     [provider]  pioglitazone (ACTOS) 45 MG tablet Take 45 mg by mouth daily.    [provider]  sennosides-docusate sodium (SENOKOT-S) 8.6-50 MG tablet Take 1 tablet by mouth 2 (two) times daily. Reported on 03/20/2015    [provider]  sorbitol 70 % solution Take 15 mLs by mouth daily as needed (constipation).     [provider]  tacrolimus  (PROGRAF) 1 MG capsule Take 2 mg by mouth 2 (two) times daily.    [provider]    Family History Family History  Problem Relation Age of Onset  . Diabetes Mother   . Hypertension Mother   . Hypertension Father     Social History Social History   Tobacco Use  . Smoking status: Never Smoker  . Smokeless tobacco: Never Used  Substance Use Topics  . Alcohol use: No  . Drug use: No     Allergies   Patient has no known allergies.   Review of Systems Review of Systems  Gastrointestinal: Positive for diarrhea and vomiting. Negative for abdominal pain.  Genitourinary:       Diminished urine output  Allergic/Immunologic: Positive for immunocompromised state.  All other systems reviewed and are negative.    Physical Exam Updated Vital Signs BP (!) 153/61 (BP Location: Right Arm)   Pulse 64   Temp 98.5 F (36.9 C) (Oral)   Resp 18   SpO2 99%   Physical Exam  Constitutional: He appears well-developed and well-nourished.  HENT:  Head: Normocephalic and atraumatic.  Eyes: Pupils are equal, round, and reactive to light. Conjunctivae are normal.  Neck: Neck supple. No tracheal deviation present. No thyromegaly present.  Cardiovascular: Normal rate and regular rhythm.  No murmur heard. Pulmonary/Chest: Effort normal and breath sounds normal.  Abdominal: Soft. Bowel sounds are normal. He exhibits no distension. There is no tenderness.  Genitourinary: Penis normal.  Genitourinary Comments: Normal male genitalia  Musculoskeletal: Normal range of motion. He exhibits no edema or tenderness.  Neurological: He is alert. Coordination normal.  Skin: Skin is warm and dry. Capillary refill takes less than 2 seconds. No rash noted. There is pallor.  Psychiatric: He has a normal mood and affect.  Nursing note and vitals reviewed.    ED Treatments / Results  Labs (all labs ordered are listed, but only abnormal results are displayed) Labs Reviewed  CBC - Abnormal;  Notable for the following components:      Result Value   RBC 3.29 (*)    Hemoglobin 9.4 (*)    HCT 29.4 (*)    All other components within normal limits  CBG MONITORING, ED - Abnormal; Notable for the following components:   Glucose-Capillary 56 (*)    All other components within normal limits  C DIFFICILE QUICK SCREEN W PCR REFLEX  GASTROINTESTINAL PANEL BY PCR, STOOL (REPLACES STOOL CULTURE)  LIPASE, BLOOD  COMPREHENSIVE METABOLIC PANEL  URINALYSIS, ROUTINE W REFLEX MICROSCOPIC   Results for orders placed or performed during the hospital encounter of 08/24/17  Lipase, blood  Result Value Ref Range   Lipase 29 11 - 51 U/L  Comprehensive metabolic panel  Result Value Ref Range   Sodium 137 135 - 145 mmol/L   Potassium 3.6 3.5 - 5.1  mmol/L   Chloride 111 101 - 111 mmol/L   CO2 19 (L) 22 - 32 mmol/L   Glucose, Bld 59 (L) 65 - 99 mg/dL   BUN 47 (H) 6 - 20 mg/dL   Creatinine, Ser 3.26 (H) 0.61 - 1.24 mg/dL   Calcium 8.4 (L) 8.9 - 10.3 mg/dL   Total Protein 5.7 (L) 6.5 - 8.1 g/dL   Albumin 3.5 3.5 - 5.0 g/dL   AST 32 15 - 41 U/L   ALT 21 17 - 63 U/L   Alkaline Phosphatase 79 38 - 126 U/L   Total Bilirubin 0.5 0.3 - 1.2 mg/dL   GFR calc non Af Amer 19 (L) >60 mL/min   GFR calc Af Amer 22 (L) >60 mL/min   Anion gap 7 5 - 15  CBC  Result Value Ref Range   WBC 6.3 4.0 - 10.5 K/uL   RBC 3.29 (L) 4.22 - 5.81 MIL/uL   Hemoglobin 9.4 (L) 13.0 - 17.0 g/dL   HCT 29.4 (L) 39.0 - 52.0 %   MCV 89.4 78.0 - 100.0 fL   MCH 28.6 26.0 - 34.0 pg   MCHC 32.0 30.0 - 36.0 g/dL   RDW 15.0 11.5 - 15.5 %   Platelets 215 150 - 400 K/uL  CBG monitoring, ED  Result Value Ref Range   Glucose-Capillary 56 (L) 65 - 99 mg/dL  POC CBG, ED  Result Value Ref Range   Glucose-Capillary 92 65 - 99 mg/dL   No results found.  EKG None  Radiology No results found.  Procedures Procedures (including critical care time)  Medications Ordered in ED Medications  sodium chloride 0.9 % bolus 2,000  mL (has no administration in time range)     Initial Impression / Assessment and Plan / ED Course  I have reviewed the triage vital signs and the nursing notes.  Pertinent labs & imaging results that were available during my care of the patient were reviewed by me and considered in my medical decision making (see chart for details).     Dr. Joelyn Oms from nephrology service consulted. Renal service will see patient in hospital today or tomorrow. He requests admission to hospitalist service. IV hydration. He reports patient had a creatinine of 2.8 one month ago. Labwork consistent with worsening renal insufficiencyand anemia. hospitalist service, Dr. Alcario Drought consulted to arrange for overnight stay. I do not suspect rejection of kidney as patient has no pain no tenderness no fever Final Clinical Impressions(s) / ED Diagnoses  Diagnosis #1acute on chronic renal insufficiency #2 anemia  Final diagnoses:  None    ED Discharge Orders    None       Orlie Dakin, MD 08/24/17 2049    Orlie Dakin, MD 08/24/17 2049

## 2017-08-24 NOTE — ED Triage Notes (Signed)
Since 08/02/17 got sick with NV for 2 weeks and has had diarrhea since.  Pt is a double transplant patient and he has decreased urinary output.  Pt sent here for possible hydration and rule out c-diff that he has had done before. Pt states diarrhea 3 times in the last 24 hours

## 2017-08-25 ENCOUNTER — Other Ambulatory Visit: Payer: Self-pay

## 2017-08-25 ENCOUNTER — Encounter (HOSPITAL_COMMUNITY): Payer: Self-pay | Admitting: General Practice

## 2017-08-25 DIAGNOSIS — R197 Diarrhea, unspecified: Secondary | ICD-10-CM

## 2017-08-25 DIAGNOSIS — N179 Acute kidney failure, unspecified: Principal | ICD-10-CM

## 2017-08-25 DIAGNOSIS — Z940 Kidney transplant status: Secondary | ICD-10-CM

## 2017-08-25 DIAGNOSIS — E11649 Type 2 diabetes mellitus with hypoglycemia without coma: Secondary | ICD-10-CM

## 2017-08-25 DIAGNOSIS — Z9483 Pancreas transplant status: Secondary | ICD-10-CM

## 2017-08-25 DIAGNOSIS — N189 Chronic kidney disease, unspecified: Secondary | ICD-10-CM

## 2017-08-25 DIAGNOSIS — Z794 Long term (current) use of insulin: Secondary | ICD-10-CM

## 2017-08-25 DIAGNOSIS — I10 Essential (primary) hypertension: Secondary | ICD-10-CM

## 2017-08-25 DIAGNOSIS — E876 Hypokalemia: Secondary | ICD-10-CM

## 2017-08-25 LAB — BASIC METABOLIC PANEL
ANION GAP: 7 (ref 5–15)
BUN: 40 mg/dL — ABNORMAL HIGH (ref 6–20)
CALCIUM: 8.2 mg/dL — AB (ref 8.9–10.3)
CO2: 20 mmol/L — AB (ref 22–32)
Chloride: 116 mmol/L — ABNORMAL HIGH (ref 101–111)
Creatinine, Ser: 2.77 mg/dL — ABNORMAL HIGH (ref 0.61–1.24)
GFR calc Af Amer: 27 mL/min — ABNORMAL LOW (ref >60)
GFR calc non Af Amer: 23 mL/min — ABNORMAL LOW (ref >60)
GLUCOSE: 55 mg/dL — AB (ref 65–99)
POTASSIUM: 3.2 mmol/L — AB (ref 3.5–5.1)
Sodium: 143 mmol/L (ref 135–145)

## 2017-08-25 LAB — CBC
HEMATOCRIT: 27.4 % — AB (ref 39.0–52.0)
Hemoglobin: 8.8 g/dL — ABNORMAL LOW (ref 13.0–17.0)
MCH: 28.9 pg (ref 26.0–34.0)
MCHC: 32.1 g/dL (ref 30.0–36.0)
MCV: 90.1 fL (ref 78.0–100.0)
Platelets: 188 10*3/uL (ref 150–400)
RBC: 3.04 MIL/uL — ABNORMAL LOW (ref 4.22–5.81)
RDW: 15 % (ref 11.5–15.5)
WBC: 5.4 10*3/uL (ref 4.0–10.5)

## 2017-08-25 LAB — CBG MONITORING, ED
GLUCOSE-CAPILLARY: 35 mg/dL — AB (ref 65–99)
GLUCOSE-CAPILLARY: 60 mg/dL — AB (ref 65–99)
GLUCOSE-CAPILLARY: 115 mg/dL — AB (ref 65–99)
Glucose-Capillary: 111 mg/dL — ABNORMAL HIGH (ref 65–99)
Glucose-Capillary: 113 mg/dL — ABNORMAL HIGH (ref 65–99)

## 2017-08-25 LAB — HEMOGLOBIN A1C
Hgb A1c MFr Bld: 7.3 % — ABNORMAL HIGH (ref 4.8–5.6)
Mean Plasma Glucose: 162.81 mg/dL

## 2017-08-25 LAB — CREATININE, URINE, RANDOM: Creatinine, Urine: 68.94 mg/dL

## 2017-08-25 LAB — GLUCOSE, CAPILLARY
GLUCOSE-CAPILLARY: 174 mg/dL — AB (ref 65–99)
Glucose-Capillary: 103 mg/dL — ABNORMAL HIGH (ref 65–99)

## 2017-08-25 LAB — SODIUM, URINE, RANDOM: Sodium, Ur: 39 mmol/L

## 2017-08-25 LAB — HIV ANTIBODY (ROUTINE TESTING W REFLEX): HIV Screen 4th Generation wRfx: NONREACTIVE

## 2017-08-25 MED ORDER — POTASSIUM CHLORIDE CRYS ER 20 MEQ PO TBCR
40.0000 meq | EXTENDED_RELEASE_TABLET | Freq: Once | ORAL | Status: AC
  Administered 2017-08-25: 40 meq via ORAL
  Filled 2017-08-25: qty 2

## 2017-08-25 NOTE — Progress Notes (Signed)
1650 Received pt from ED(5C), A&O x4. Abd is soft, no diarrhea today, had a solid BM while he was at [facility].

## 2017-08-25 NOTE — Progress Notes (Signed)
Inpatient Diabetes Program Recommendations  AACE/ADA: New Consensus Statement on Inpatient Glycemic Control (2015)  Target Ranges:  Prepandial:   less than 140 mg/dL      Peak postprandial:   less than 180 mg/dL (1-2 hours)      Critically ill patients:  140 - 180 mg/dL   Lab Results  Component Value Date   GLUCAP 115 (H) 08/25/2017   HGBA1C 7.2 (H) 01/16/2013    Review of Glycemic ControlResults for Eric Mccarty, Eric "RANDY" (MRN [id number]) as of 08/25/2017 11:10  Ref. Range 08/24/2017 20:13 08/25/2017 00:01 08/25/2017 05:01 08/25/2017 05:40 08/25/2017 08:07  Glucose-Capillary Latest Ref Range: 65 - 99 mg/dL 92 111 (H) 35 (LL) 60 (L) 115 (H)   Diabetes history: Type 2 DM/ renal/pancreas transplant Outpatient Diabetes medications: Glipizide 10 mg bid, Lantus 18 units q HS, Actos 45 mg daily Current orders for Inpatient glycemic control:  Novolog sensitive tid with meals Inpatient Diabetes Program Recommendations:   Note low blood sugar this AM.  Agree with holding Lantus at this time.  May consider checking A1C.   Thanks,  Adah Perl, RN, BC-ADM Inpatient Diabetes Coordinator Pager [phone] (8a-5p)

## 2017-08-25 NOTE — Progress Notes (Addendum)
PROGRESS NOTE   Eric Mccarty  ERD:[id number]    DOB: [birth year]    DOA: 08/24/2017  PCP: Corliss Parish, MD   I have briefly reviewed patients previous medical records in [facility].  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 Moshe Cipro, Nephrology), DM 2/IDDM, HTN, HLD, status post excision of basal cell carcinoma left lower leg, presented with vomiting and diarrhea that started simultaneously in both him and his wife after they shared fried chicken meal earlier in the day, spouse improved within 4 days, patient however had ongoing diarrhea, vomiting resolved couple weeks ago, also noted decreased urine output.  Admitted for acute on chronic kidney disease, diarrhea and dehydration.  Renal consulted.  Improving.   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  Nephrology consulted and suspected acute kidney injury secondary to dehydration from GI losses and poor oral intake.  Lasix held.  Not on ACEI/ARB.  IV hydrated.  Creatinine has returned to baseline.  Discontinued IV fluids.  Encourage oral intake and follow BMP in a.m.  Hypokalemia: Replace and follow.  Renal and pancreatic transplant on immunosuppressants: Continue mycophenolate and tacrolimus.  Type II DM/IDDM with renal complications and hypoglycemia: CBG 35 early 3/50 morning.  Likely related to poor oral intake, worsening renal functions and insulins.  Temporarily hold Lantus and hold oral hypoglycemics.  Treat hypoglycemia per protocol.  Use SSI only for persistent hyperglycemia.  Monitor for further episodes  overnight.  Persistent diarrhea: Suspected due to food poisoning.  Improved or even resolved.  Diet as tolerated and continue to monitor for recurrence in which case may need testing.  Currently on IV ceftriaxone and Flagyl, discussed with ID MD on call 5/30, it appears as though patient's diarrhea was improving even before initiation of antibiotics and had just taken a long time.  Recommended discontinuing all antibiotics and monitoring.  Essential hypertension: Mildly uncontrolled now.  Continue amlodipine, clonidine and labetalol.  Lasix on hold.  Anemia in chronic kidney disease: Some of his anemia may also be dilutional.  No bleeding reported.  Follow CBC in a.m.   DVT prophylaxis: Subcutaneous heparin Code Status: Full Family Communication: None at bedside Disposition: DC home pending further clinical improvement, possibly 5/31.   Consultants:  Nephrology  Procedures:  None  Antimicrobials:  IV ceftriaxone and metronidazole   Subjective: Feels much better.  No BM since approximately 4 PM on 5/30.  No nausea, vomiting or abdominal pain.  Tolerating liquid diet and eager to move on to regular diet.  Feels stronger.  Noted hypoglycemia CBG 35 this morning but as per RN, patient was asymptomatic.  ROS: As above.  Objective:  Vitals:   08/25/17 1100 08/25/17 1200 08/25/17 1300 08/25/17 1330  BP: (!) 166/71 (!) 175/74    Pulse: 70 70 67 63  Resp: 16 15 16 17   Temp:      TempSrc:      SpO2: 100% 99% 100% 100%    Examination:  General exam: Pleasant middle-aged male, moderately built and nourished lying comfortably supine in bed.  Oral mucosa moist. Respiratory system: Clear to auscultation. Respiratory effort  normal. Cardiovascular system: S1 & S2 heard, RRR. No JVD, murmurs, rubs, gallops or clicks. No pedal edema. Gastrointestinal system: Abdomen is nondistended, soft and nontender. No organomegaly or masses felt. Normal bowel sounds heard. Central nervous system:  Alert and oriented. No focal neurological deficits. Extremities: Symmetric 5 x 5 power. Skin: No rashes, lesions or ulcers.  Chronic hyperpigmented skin changes both legs.  Also healed scar approximately 4 cm in diameter left lower shin from skin cancer excision surgery. Psychiatry: Judgement and insight appear normal. Mood & affect appropriate.     Data Reviewed: I have personally reviewed following labs and imaging studies  CBC: Recent Labs  Lab 08/24/17 1739 08/25/17 0407  WBC 6.3 5.4  HGB 9.4* 8.8*  HCT 29.4* 27.4*  MCV 89.4 90.1  PLT 215 456   Basic Metabolic Panel: Recent Labs  Lab 08/24/17 1739 08/25/17 0407  NA 137 143  K 3.6 3.2*  CL 111 116*  CO2 19* 20*  GLUCOSE 59* 55*  BUN 47* 40*  CREATININE 3.26* 2.77*  CALCIUM 8.4* 8.2*   Liver Function Tests: Recent Labs  Lab 08/24/17 1739  AST 32  ALT 21  ALKPHOS 79  BILITOT 0.5  PROT 5.7*  ALBUMIN 3.5   HbA1C: No results for input(s): HGBA1C in the last 72 hours. CBG: Recent Labs  Lab 08/25/17 0001 08/25/17 0501 08/25/17 0540 08/25/17 0807 08/25/17 1136  GLUCAP 111* 35* 60* 115* 113*    No results found for this or any previous visit (from the past 240 hour(s)).       Radiology Studies: No results found.      Scheduled Meds: . amitriptyline  25 mg Oral QHS  . amLODipine  10 mg Oral QHS  . aspirin EC  81 mg Oral Daily  . atorvastatin  10 mg Oral q1800  . calcitRIOL  0.25 mcg Oral Daily  . cloNIDine  0.3 mg Oral BID  . heparin  5,000 Units Subcutaneous Q8H  . insulin aspart  0-9 Units Subcutaneous TID WC  . labetalol  200 mg Oral q morning - 10a   And  . labetalol  400 mg Oral QPM  . mycophenolate  500 mg Oral TID  . senna-docusate  1 tablet Oral BID  . tacrolimus  2 mg Oral BID   Continuous Infusions: . cefTRIAXone (ROCEPHIN)  IV Stopped (08/24/17 2224)  . metronidazole Stopped (08/25/17 0700)     LOS: 1 day     Vernell Leep, MD, FACP, [facility] Pager  [phone]  If 7PM-7AM, please contact night-coverage [email or web address] Password [email or web address] 08/25/2017, 1:41 PM

## 2017-08-25 NOTE — ED Notes (Signed)
Pt.  was given [place] sandwich and orange juice per [employer]

## 2017-08-25 NOTE — Progress Notes (Signed)
Subjective:  BP now high....1400 of urine recorded.  crt down from 3.2 to 2.7 - he feels much better  Objective Vital signs in last 24 hours: Vitals:   08/25/17 0600 08/25/17 0700 08/25/17 0800 08/25/17 0900  BP: (!) 143/59 (!) 154/60 (!) 149/60 (!) 156/66  Pulse: 61 69 72 65  Resp: 12 14 11 15   Temp:      TempSrc:      SpO2: 100% 99% 99% 100%   Weight change:   Intake/Output Summary (Last 24 hours) at 08/25/2017 1015 Last data filed at 08/25/2017 0241 Gross per 24 hour  Intake 2100 ml  Output 1400 ml  Net 700 ml    Assessment 56M with AoCKD in setting of renal transplant from 2002 on Tac/MMF; BL SCr 2.5-2.8; has persistent diarrhea after likely food poisoning. Was able to keep his immune suppressing drugs down throughout   1. AoCKD3-4, on Tac/MMF for KT 2002; hypovolemic renal failure presumed until fails fluid challenge 2. Persistent diarrhea, nonbloody, after food poisoning 3. HTN, lasix held for now; not on RAAS inhibitor 4. Immunosuppression Tac/MMF 5. DM2 6. Anemia, Hb in 11s-12s at baseline  Plan 1. Recieved hydration - now that BP is high think is no longer dry will stop IVF 2. GI w/u per TRH started 3. Further workup if doesn't return to baseline renal function wit hydration 4. Daily weights, Daily Renal Panel, Strict I/Os, Avoid nephrotoxins (NSAIDs, judicious IV Contrast)  5. Feel like he could stay one more night, will stop IVF- make sure can keep POs down and if crt is no worse he can go home tomorrow      Dasja Brase A    Labs: Basic Metabolic Panel: Recent Labs  Lab 08/24/17 1739 08/25/17 0407  NA 137 143  K 3.6 3.2*  CL 111 116*  CO2 19* 20*  GLUCOSE 59* 55*  BUN 47* 40*  CREATININE 3.26* 2.77*  CALCIUM 8.4* 8.2*   Liver Function Tests: Recent Labs  Lab 08/24/17 1739  AST 32  ALT 21  ALKPHOS 79  BILITOT 0.5  PROT 5.7*  ALBUMIN 3.5   Recent Labs  Lab 08/24/17 1739  LIPASE 29   No results for input(s): AMMONIA in the  last 168 hours. CBC: Recent Labs  Lab 08/24/17 1739 08/25/17 0407  WBC 6.3 5.4  HGB 9.4* 8.8*  HCT 29.4* 27.4*  MCV 89.4 90.1  PLT 215 188   Cardiac Enzymes: No results for input(s): CKTOTAL, CKMB, CKMBINDEX, TROPONINI in the last 168 hours. CBG: Recent Labs  Lab 08/24/17 2013 08/25/17 0001 08/25/17 0501 08/25/17 0540 08/25/17 0807  GLUCAP 92 111* 35* 60* 115*    Iron Studies: No results for input(s): IRON, TIBC, TRANSFERRIN, FERRITIN in the last 72 hours. Studies/Results: No results found. Medications: Infusions: . cefTRIAXone (ROCEPHIN)  IV Stopped (08/24/17 2224)  . lactated ringers 75 mL/hr at 08/25/17 0939  . metronidazole Stopped (08/25/17 0700)    Scheduled Medications: . amitriptyline  25 mg Oral QHS  . amLODipine  10 mg Oral QHS  . aspirin EC  81 mg Oral Daily  . atorvastatin  10 mg Oral q1800  . calcitRIOL  0.25 mcg Oral Daily  . cloNIDine  0.3 mg Oral BID  . heparin  5,000 Units Subcutaneous Q8H  . insulin aspart  0-9 Units Subcutaneous TID WC  . labetalol  200 mg Oral q morning - 10a   And  . labetalol  400 mg Oral QPM  . mycophenolate  500 mg Oral  TID  . senna-docusate  1 tablet Oral BID  . tacrolimus  2 mg Oral BID    have reviewed scheduled and prn medications.  Physical Exam: General:  Feels better  Heart: RRR Lungs: mostly clear Abdomen: soft, non tender Extremities: no edema     08/25/2017,10:15 AM  LOS: 1 day

## 2017-08-26 DIAGNOSIS — N185 Chronic kidney disease, stage 5: Secondary | ICD-10-CM

## 2017-08-26 DIAGNOSIS — E1122 Type 2 diabetes mellitus with diabetic chronic kidney disease: Secondary | ICD-10-CM

## 2017-08-26 DIAGNOSIS — E860 Dehydration: Secondary | ICD-10-CM

## 2017-08-26 LAB — RENAL FUNCTION PANEL
ANION GAP: 7 (ref 5–15)
Albumin: 3.2 g/dL — ABNORMAL LOW (ref 3.5–5.0)
BUN: 32 mg/dL — ABNORMAL HIGH (ref 6–20)
CALCIUM: 8.4 mg/dL — AB (ref 8.9–10.3)
CHLORIDE: 115 mmol/L — AB (ref 101–111)
CO2: 17 mmol/L — AB (ref 22–32)
Creatinine, Ser: 2.28 mg/dL — ABNORMAL HIGH (ref 0.61–1.24)
GFR calc non Af Amer: 29 mL/min — ABNORMAL LOW (ref >60)
GFR, EST AFRICAN AMERICAN: 34 mL/min — AB (ref >60)
Glucose, Bld: 139 mg/dL — ABNORMAL HIGH (ref 65–99)
POTASSIUM: 3.5 mmol/L (ref 3.5–5.1)
Phosphorus: 3 mg/dL (ref 2.5–4.6)
SODIUM: 139 mmol/L (ref 135–145)

## 2017-08-26 LAB — CBC
HEMATOCRIT: 30.7 % — AB (ref 39.0–52.0)
HEMOGLOBIN: 9.8 g/dL — AB (ref 13.0–17.0)
MCH: 28.7 pg (ref 26.0–34.0)
MCHC: 31.9 g/dL (ref 30.0–36.0)
MCV: 89.8 fL (ref 78.0–100.0)
Platelets: 228 10*3/uL (ref 150–400)
RBC: 3.42 MIL/uL — ABNORMAL LOW (ref 4.22–5.81)
RDW: 14.9 % (ref 11.5–15.5)
WBC: 6.4 10*3/uL (ref 4.0–10.5)

## 2017-08-26 LAB — GLUCOSE, CAPILLARY
GLUCOSE-CAPILLARY: 116 mg/dL — AB (ref 65–99)
Glucose-Capillary: 148 mg/dL — ABNORMAL HIGH (ref 65–99)

## 2017-08-26 LAB — MAGNESIUM: Magnesium: 1.1 mg/dL — ABNORMAL LOW (ref 1.7–2.4)

## 2017-08-26 MED ORDER — MAGNESIUM SULFATE 2 GM/50ML IV SOLN
2.0000 g | Freq: Once | INTRAVENOUS | Status: AC
  Administered 2017-08-26: 2 g via INTRAVENOUS
  Filled 2017-08-26: qty 50

## 2017-08-26 MED ORDER — INSULIN GLARGINE 100 UNIT/ML ~~LOC~~ SOLN: 5.0000 [IU] | Freq: Every day | SUBCUTANEOUS | Status: DC

## 2017-08-26 NOTE — Discharge Summary (Signed)
Physician Discharge Summary  Eric Mccarty KNL:[id number] DOB: 1955-11-24  PCP: Corliss Parish, MD  Admit date: 08/24/2017 Discharge date: 08/26/2017  Recommendations for Outpatient Follow-up:  1. Dr. Corliss Parish, PCP/Nephrology in 1 week with repeat labs (CBC, Renal panel & Mg).  MDs office will arrange follow-up.  Home Health: None Equipment/Devices: None  Discharge Condition: Improved and stable CODE STATUS: Full Diet recommendation: Heart healthy & diabetic diet.  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 Moshe Cipro, Nephrology), DM 2/IDDM, HTN, HLD, status post excision of basal cell carcinoma left lower leg, presented with vomiting and diarrhea that started simultaneously in both him and his wife after they shared fried chicken meal earlier in the day, spouse improved within 4 days, patient however had ongoing diarrhea, vomiting resolved couple weeks ago, also noted decreased urine output.  Admitted for acute on chronic kidney disease, diarrhea and dehydration.  Renal consulted.    Assessment & Plan:  Acute on stage IV chronic kidney disease in setting of renal transplant: Baseline creatinine 2.5- 2.8.  Nephrology consulted and suspected acute kidney injury secondary to dehydration from GI losses and poor oral intake.  Lasix held.  Not on ACEI/ARB.  IV hydrated.  Creatinine has returned to baseline.  Discontinued IV fluids.    Tolerating adequate oral intake.  Nephrology seen and cleared for discharge.  Resuming Lasix at discharge.  Hypokalemia: Replaced  Hypomagnesemia: 1.1.  Replaced IV prior to discharge.  Follow  magnesium next week as outpatient.  Renal and pancreatic transplant on immunosuppressants: Continue mycophenolate and tacrolimus.  Type II DM/IDDM with renal complications and hypoglycemia: CBG 35 early 5/30 morning.  Likely related to poor oral intake, worsening renal functions and insulins.  Temporarily held Lantus and held oral hypoglycemics.  Treat hypoglycemia per protocol.  Use SSI only for persistent hyperglycemia.  No further hypoglycemic episodes.  Highest CBG has been 174.  To avoid further hypoglycemic episodes as outpatient, discontinued oral hypoglycemics at discharge reduced Lantus from 18 to 5 units at bedtime.  Advised patient to continue monitoring CBGs closely, maintain a log and follow-up with PCP at which time may consider increasing Lantus back to previous dose and/or resuming oral hypoglycemics.  If has recurrent hypoglycemic episodes at home, advised to stop Lantus altogether.  He verbalized understanding.  He is well educated in managing his diabetes since he says age 7.  A1c 7.3.  Persistent diarrhea: Suspected due to food poisoning.  Resolved.  On admission he had been started on IV ceftriaxone and Flagyl.  I discussed with ID MD on call 5/30, it appears as though patient's diarrhea was improving even before initiation of antibiotics and had just taken a long time.  Recommended discontinuing all antibiotics and monitoring.  Essential hypertension: Mildly uncontrolled now.    Continue all prior home antihypertensives.  PCP/nephrology has been closely managing this as outpatient.  As per patient, he has been instructed to gradually taper off clonidine and initiate hydralazine which he plans to do on returning home.  Continue prior home dose of amlodipine, labetalol and Lasix.  Anemia in chronic kidney disease: Some of his anemia may also be dilutional.  No bleeding reported.  Stable.   Consultants:  Nephrology  Procedures:  None      Discharge  Instructions  Discharge Instructions    Call MD for:   Complete by:  As directed    Recurrent diarrhea.   Call MD for:  difficulty breathing, headache or visual disturbances   Complete by:  As directed    Call MD for:  extreme fatigue   Complete by:  As directed    Call MD for:  persistant dizziness or light-headedness   Complete by:  As directed    Call MD for:  persistant nausea and vomiting   Complete by:  As directed    Call MD for:  severe uncontrolled pain   Complete by:  As directed    Call MD for:  temperature >100.4   Complete by:  As directed    Diet - low sodium heart healthy   Complete by:  As directed    Diet Carb Modified   Complete by:  As directed    Increase activity slowly   Complete by:  As directed        Medication List    STOP taking these medications   glipiZIDE 10 MG tablet Commonly known as:  GLUCOTROL   pioglitazone 45 MG tablet Commonly known as:  ACTOS     TAKE these medications   amitriptyline 25 MG tablet Commonly known as:  ELAVIL Take 25 mg by mouth at bedtime.   amLODipine 10 MG tablet Commonly known as:  NORVASC Take 10 mg by mouth at bedtime.   aspirin 81 MG tablet Take 81 mg by mouth daily.   atorvastatin 10 MG tablet Commonly known as:  LIPITOR Take 10 mg by mouth daily at 6 PM.   calcitRIOL 0.25 MCG capsule Commonly known as:  ROCALTROL Take 0.25 mcg by mouth daily.   cloNIDine 0.3 MG tablet Commonly known as:  CATAPRES Take 0.3 mg by mouth See admin instructions. Takes either three times daily or twice a daily depending on blood pressure   furosemide 20 MG tablet Commonly known as:  LASIX Take 60 mg by mouth 2 (two) times daily.   hydrALAZINE 25 MG tablet Commonly known as:  APRESOLINE Take 25 mg by mouth 2 (two) times daily.   insulin glargine 100 UNIT/ML injection Commonly known as:  LANTUS Inject 0.05 mLs (5 Units total) into the skin at bedtime. What changed:  how much to take   labetalol 200 MG  tablet Commonly known as:  NORMODYNE Take 200-400 mg by mouth See admin instructions. Take 1 tablet in the morning and take 2 tablets in the evening   mycophenolate 500 MG tablet Commonly known as:  CELLCEPT Take 500 mg by mouth 3 (three) times daily.   sennosides-docusate sodium 8.6-50 MG tablet Commonly known as:  SENOKOT-S Take 1 tablet by mouth 2 (two) times daily. Reported on 03/20/2015   sorbitol 70 % solution Take 15 mLs by mouth daily as needed (constipation).   tacrolimus 1 MG capsule Commonly known as:  PROGRAF Take 2 mg by mouth 2 (two) times daily.      Follow-up Information    Corliss Parish, MD. Schedule an appointment as soon as possible for a visit in 1 week(s).   Specialty:  Nephrology Why:  To be seen with repeat labs (CBC, renal panel & Mg). Contact information: [place] [address] [phone]          No Known Allergies    Procedures/Studies: No results  found.    Subjective: Denies complaints.  Reports having almost normal consistency stools yesterday afternoon and none since then.  No nausea, vomiting, abdominal pain, dizziness or lightheadedness.  Tolerated regular diet.  As per nursing, no acute issues reported.  Discharge Exam:  Vitals:   08/25/17 1300 08/25/17 1330 08/25/17 1655 08/26/17 0436  BP:   (!) 182/77 (!) 173/71  Pulse: 67 63 68 73  Resp: 16 17 15 16   Temp:   97.8 F (36.6 C) 98.4 F (36.9 C)  TempSrc:   Oral Oral  SpO2: 100% 100% 100% 98%  Weight:   84.4 kg (186 lb 1.1 oz)      General exam: Pleasant middle-aged male, moderately built and nourished lying comfortably supine in bed.  Oral mucosa moist. Respiratory system: Clear to auscultation. Respiratory effort normal. Cardiovascular system: S1 & S2 heard, RRR. No JVD, murmurs, rubs, gallops or clicks. No pedal edema. Gastrointestinal system: Abdomen is nondistended, soft and nontender. No organomegaly or masses felt. Normal bowel sounds  heard. Central nervous system: Alert and oriented. No focal neurological deficits. Extremities: Symmetric 5 x 5 power. Skin: No rashes, lesions or ulcers.  Chronic hyperpigmented skin changes both legs.  Also healed scar approximately 4 cm in diameter left lower shin from skin cancer excision surgery. Psychiatry: Judgement and insight appear normal. Mood & affect appropriate.       The results of significant diagnostics from this hospitalization (including imaging, microbiology, ancillary and laboratory) are listed below for reference.     Microbiology: No results found for this or any previous visit (from the past 240 hour(s)).   Labs: CBC: Recent Labs  Lab 08/24/17 1739 08/25/17 0407 08/26/17 0529  WBC 6.3 5.4 6.4  HGB 9.4* 8.8* 9.8*  HCT 29.4* 27.4* 30.7*  MCV 89.4 90.1 89.8  PLT 215 188 295   Basic Metabolic Panel: Recent Labs  Lab 08/24/17 1739 08/25/17 0407 08/26/17 0529  NA 137 143 139  K 3.6 3.2* 3.5  CL 111 116* 115*  CO2 19* 20* 17*  GLUCOSE 59* 55* 139*  BUN 47* 40* 32*  CREATININE 3.26* 2.77* 2.28*  CALCIUM 8.4* 8.2* 8.4*  MG  --   --  1.1*  PHOS  --   --  3.0   Liver Function Tests: Recent Labs  Lab 08/24/17 1739 08/26/17 0529  AST 32  --   ALT 21  --   ALKPHOS 79  --   BILITOT 0.5  --   PROT 5.7*  --   ALBUMIN 3.5 3.2*    CBG: Recent Labs  Lab 08/25/17 1136 08/25/17 1700 08/25/17 2104 08/26/17 0607 08/26/17 1130  GLUCAP 113* 174* 103* 148* 116*   Hgb A1c Recent Labs    08/25/17 1357  HGBA1C 7.3*   Urinalysis    Component Value Date/Time   COLORURINE STRAW (A) 08/24/2017 2047   APPEARANCEUR CLEAR 08/24/2017 2047   LABSPEC 1.006 08/24/2017 2047   PHURINE 5.0 08/24/2017 2047   GLUCOSEU NEGATIVE 08/24/2017 2047   HGBUR NEGATIVE 08/24/2017 2047   BILIRUBINUR NEGATIVE 08/24/2017 2047   KETONESUR NEGATIVE 08/24/2017 2047   PROTEINUR NEGATIVE 08/24/2017 2047   UROBILINOGEN 0.2 07/13/2009 1851   NITRITE NEGATIVE 08/24/2017  2047   LEUKOCYTESUR NEGATIVE 08/24/2017 2047      Time coordinating discharge: 40 minutes  SIGNED:  Vernell Leep, MD, FACP, [facility]. [facility] Pager [phone]  If 7PM-7AM, please contact night-coverage [email or web address] Password [email or web address] 08/26/2017, 2:42 PM

## 2017-08-26 NOTE — Progress Notes (Signed)
S:Feels much better O:BP (!) 173/71 (BP Location: Right Arm)   Pulse 73   Temp 98.4 F (36.9 C) (Oral)   Resp 16   Wt 84.4 kg (186 lb 1.1 oz)   SpO2 98%   BMI 29.14 kg/m   Intake/Output Summary (Last 24 hours) at 08/26/2017 1157 Last data filed at 08/26/2017 0900 Gross per 24 hour  Intake 660 ml  Output -  Net 660 ml   Intake/Output: I/O last 3 completed shifts: In: 3333.8 [P.O.:120; I.V.:813.8; IV Piggyback:2400] Out: 1400 [Urine:1400]  Intake/Output this shift:  Total I/O In: 240 [P.O.:240] Out: -  Weight change:  Gen: NAD CVS: no rub Resp: cta Abd: obese, +BS, soft, NT/NT Ext: no edema  Recent Labs  Lab 08/24/17 1739 08/25/17 0407 08/26/17 0529  NA 137 143 139  K 3.6 3.2* 3.5  CL 111 116* 115*  CO2 19* 20* 17*  GLUCOSE 59* 55* 139*  BUN 47* 40* 32*  CREATININE 3.26* 2.77* 2.28*  ALBUMIN 3.5  --  3.2*  CALCIUM 8.4* 8.2* 8.4*  PHOS  --   --  3.0  AST 32  --   --   ALT 21  --   --    Liver Function Tests: Recent Labs  Lab 08/24/17 1739 08/26/17 0529  AST 32  --   ALT 21  --   ALKPHOS 79  --   BILITOT 0.5  --   PROT 5.7*  --   ALBUMIN 3.5 3.2*   Recent Labs  Lab 08/24/17 1739  LIPASE 29   No results for input(s): AMMONIA in the last 168 hours. CBC: Recent Labs  Lab 08/24/17 1739 08/25/17 0407 08/26/17 0529  WBC 6.3 5.4 6.4  HGB 9.4* 8.8* 9.8*  HCT 29.4* 27.4* 30.7*  MCV 89.4 90.1 89.8  PLT 215 188 228   Cardiac Enzymes: No results for input(s): CKTOTAL, CKMB, CKMBINDEX, TROPONINI in the last 168 hours. CBG: Recent Labs  Lab 08/25/17 1136 08/25/17 1700 08/25/17 2104 08/26/17 0607 08/26/17 1130  GLUCAP 113* 174* 103* 148* 116*    Iron Studies: No results for input(s): IRON, TIBC, TRANSFERRIN, FERRITIN in the last 72 hours. Studies/Results: No results found. Marland Kitchen amitriptyline  25 mg Oral QHS  . amLODipine  10 mg Oral QHS  . aspirin EC  81 mg Oral Daily  . atorvastatin  10 mg Oral q1800  . calcitRIOL  0.25 mcg Oral Daily  .  cloNIDine  0.3 mg Oral BID  . heparin  5,000 Units Subcutaneous Q8H  . insulin aspart  0-9 Units Subcutaneous TID WC  . labetalol  200 mg Oral q morning - 10a   And  . labetalol  400 mg Oral QPM  . mycophenolate  500 mg Oral TID  . tacrolimus  2 mg Oral BID    BMET    Component Value Date/Time   NA 139 08/26/2017 0529   K 3.5 08/26/2017 0529   CL 115 (H) 08/26/2017 0529   CO2 17 (L) 08/26/2017 0529   GLUCOSE 139 (H) 08/26/2017 0529   BUN 32 (H) 08/26/2017 0529   CREATININE 2.28 (H) 08/26/2017 0529   CALCIUM 8.4 (L) 08/26/2017 0529   GFRNONAA 29 (L) 08/26/2017 0529   GFRAA 34 (L) 08/26/2017 0529   CBC    Component Value Date/Time   WBC 6.4 08/26/2017 0529   RBC 3.42 (L) 08/26/2017 0529   HGB 9.8 (L) 08/26/2017 0529   HCT 30.7 (L) 08/26/2017 0529   PLT 228 08/26/2017 0529  MCV 89.8 08/26/2017 0529   MCH 28.7 08/26/2017 0529   MCHC 31.9 08/26/2017 0529   RDW 14.9 08/26/2017 0529   LYMPHSABS 1.0 12/23/2016 2314   MONOABS 1.1 (H) 12/23/2016 2314   EOSABS 0.1 12/23/2016 2314   BASOSABS 0.0 12/23/2016 2314     Assessment/Plan:  1. AKI/CKD stage 3 in setting of volume depletion (diarrhea) with peak Cr of 3.26 now back at baseline of 2.28.  Will need to f/u with Dr. Moshe Cipro and have labs redrawn at our office next week. 2. Diarrhea- improving, unclear etiology 3. htn- stable 4. S/p kidney transplantation- continue with tacrolimus, and MMF 5. DM- per primary 6. Disposition- stable for discharge from renal standpoint.   Donetta Potts, MD [employer] [phone]

## 2017-08-26 NOTE — Discharge Instructions (Signed)
Please get your medications reviewed and adjusted by your Primary MD. ° °Please request your Primary MD to go over all Hospital Tests and Procedure/Radiological results at the follow up, please get all Hospital records sent to your Prim MD by signing hospital release before you go home. ° °If you had Pneumonia of Lung problems at the Hospital: °Please get a 2 view Chest X ray done in 6-8 weeks after hospital discharge or sooner if instructed by your Primary MD. ° °If you have Congestive Heart Failure: °Please call your Cardiologist or Primary MD anytime you have any of the following symptoms:  °1) 3 pound weight gain in 24 hours or 5 pounds in 1 week  °2) shortness of breath, with or without a dry hacking cough  °3) swelling in the hands, feet or stomach  °4) if you have to sleep on extra pillows at night in order to breathe ° °Follow cardiac low salt diet and 1.5 lit/day fluid restriction. ° °If you have diabetes °Accuchecks 4 times/day, Once in AM empty stomach and then before each meal. °Log in all results and show them to your primary doctor at your next visit. °If any glucose reading is under 80 or above 300 call your primary MD immediately. ° °If you have Seizure/Convulsions/Epilepsy: °Please do not drive, operate heavy machinery, participate in activities at heights or participate in high speed sports until you have seen by Primary MD or a Neurologist and advised to do so again. ° °If you had Gastrointestinal Bleeding: °Please ask your Primary MD to check a complete blood count within one week of discharge or at your next visit. Your endoscopic/colonoscopic biopsies that are pending at the time of discharge, will also need to followed by your Primary MD. ° °Get Medicines reviewed and adjusted. °Please take all your medications with you for your next visit with your Primary MD ° °Please request your Primary MD to go over all hospital tests and procedure/radiological results at the follow up, please ask your  Primary MD to get all Hospital records sent to his/her office. ° °If you experience worsening of your admission symptoms, develop shortness of breath, life threatening emergency, suicidal or homicidal thoughts you must seek medical attention immediately by calling 911 or calling your MD immediately  if symptoms less severe. ° °You must read complete instructions/literature along with all the possible adverse reactions/side effects for all the Medicines you take and that have been prescribed to you. Take any new Medicines after you have completely understood and accpet all the possible adverse reactions/side effects.  ° °Do not drive or operate heavy machinery when taking Pain medications.  ° °Do not take more than prescribed Pain, Sleep and Anxiety Medications ° °Special Instructions: If you have smoked or chewed Tobacco  in the last 2 yrs please stop smoking, stop any regular Alcohol  and or any Recreational drug use. ° °Wear Seat belts while driving. ° °Please note °You were cared for by a hospitalist during your hospital stay. If you have any questions about your discharge medications or the care you received while you were in the hospital after you are discharged, you can call the unit and asked to speak with the hospitalist on call if the hospitalist that took care of you is not available. Once you are discharged, your primary care physician will handle any further medical issues. Please note that NO REFILLS for any discharge medications will be authorized once you are discharged, as it is imperative that you   return to your primary care physician (or establish a relationship with a primary care physician if you do not have one) for your aftercare needs so that they can reassess your need for medications and monitor your lab values.  You can reach the hospitalist office at phone [phone] or fax [phone]   If you do not have a primary care physician, you can call [phone] for a physician  referral.   Diarrhea, Adult Diarrhea is frequent loose and watery bowel movements. Diarrhea can make you feel weak and cause you to become dehydrated. Dehydration can make you tired and thirsty, cause you to have a dry mouth, and decrease how often you urinate. Diarrhea typically lasts 2-3 days. However, it can last longer if it is a sign of something more serious. It is important to treat your diarrhea as told by your health care provider. Follow these instructions at home: Eating and drinking  Follow these recommendations as told by your health care provider:  Take an oral rehydration solution (ORS). This is a drink that is sold at pharmacies and retail stores.  Drink clear fluids, such as water, ice chips, diluted fruit juice, and low-calorie sports drinks.  Eat bland, easy-to-digest foods in small amounts as you are able. These foods include bananas, applesauce, rice, lean meats, toast, and crackers.  Avoid drinking fluids that contain a lot of sugar or caffeine, such as energy drinks, sports drinks, and soda.  Avoid alcohol.  Avoid spicy or fatty foods.  General instructions  Drink enough fluid to keep your urine clear or pale yellow.  Wash your hands often. If soap and water are not available, use hand sanitizer.  Make sure that all people in your household wash their hands well and often.  Take over-the-counter and prescription medicines only as told by your health care provider.  Rest at home while you recover.  Watch your condition for any changes.  Take a warm bath to relieve any burning or pain from frequent diarrhea episodes.  Keep all follow-up visits as told by your health care provider. This is important. Contact a health care provider if:  You have a fever.  Your diarrhea gets worse.  You have new symptoms.  You cannot keep fluids down.  You feel light-headed or dizzy.  You have a headache  You have muscle cramps. Get help right away if:  You  have chest pain.  You feel extremely weak or you faint.  You have bloody or black stools or stools that look like tar.  You have severe pain, cramping, or bloating in your abdomen.  You have trouble breathing or you are breathing very quickly.  Your heart is beating very quickly.  Your skin feels cold and clammy.  You feel confused.  You have signs of dehydration, such as: ? Dark urine, very little urine, or no urine. ? Cracked lips. ? Dry mouth. ? Sunken eyes. ? Sleepiness. ? Weakness. This information is not intended to replace advice given to you by your health care provider. Make sure you discuss any questions you have with your health care provider. Document Released: 03/05/2002 Document Revised: 07/24/2015 Document Reviewed: 11/19/2014 Elsevier Interactive Patient Education  [employer].

## 2017-08-26 NOTE — Plan of Care (Signed)
  Problem: Education: Goal: Knowledge of General Education information will improve Outcome: Progressing   Problem: Clinical Measurements: Goal: Ability to maintain clinical measurements within normal limits will improve Outcome: Progressing   Problem: Clinical Measurements: Goal: Will remain free from infection Outcome: Progressing   Problem: Nutrition: Goal: Adequate nutrition will be maintained Outcome: Progressing

## 2017-08-26 NOTE — Progress Notes (Signed)
Patient discharge home today. Discharge instructions explained to patient and spouse and they both verbalized understanding. No c/o pain or discomfort upon discharge. Took all personal belongings. No further questions or concerns.

## 2018-10-28 ENCOUNTER — Other Ambulatory Visit: Payer: Self-pay

## 2018-10-28 ENCOUNTER — Emergency Department (HOSPITAL_BASED_OUTPATIENT_CLINIC_OR_DEPARTMENT_OTHER)
Admission: EM | Admit: 2018-10-28 | Discharge: 2018-10-28 | Disposition: A | Discharge status: Home / Self Care | Payer: BC Managed Care – PPO | Attending: Emergency Medicine | Admitting: Emergency Medicine

## 2018-10-28 ENCOUNTER — Encounter (HOSPITAL_BASED_OUTPATIENT_CLINIC_OR_DEPARTMENT_OTHER): Payer: Self-pay | Admitting: Emergency Medicine

## 2018-10-28 DIAGNOSIS — Y9389 Activity, other specified: Secondary | ICD-10-CM | POA: Diagnosis not present

## 2018-10-28 DIAGNOSIS — E1122 Type 2 diabetes mellitus with diabetic chronic kidney disease: Secondary | ICD-10-CM | POA: Insufficient documentation

## 2018-10-28 DIAGNOSIS — Y999 Unspecified external cause status: Secondary | ICD-10-CM | POA: Diagnosis not present

## 2018-10-28 DIAGNOSIS — Z940 Kidney transplant status: Secondary | ICD-10-CM | POA: Diagnosis not present

## 2018-10-28 DIAGNOSIS — Z794 Long term (current) use of insulin: Secondary | ICD-10-CM | POA: Insufficient documentation

## 2018-10-28 DIAGNOSIS — Z7982 Long term (current) use of aspirin: Secondary | ICD-10-CM | POA: Insufficient documentation

## 2018-10-28 DIAGNOSIS — Y929 Unspecified place or not applicable: Secondary | ICD-10-CM | POA: Insufficient documentation

## 2018-10-28 DIAGNOSIS — Z9483 Pancreas transplant status: Secondary | ICD-10-CM | POA: Diagnosis not present

## 2018-10-28 DIAGNOSIS — I129 Hypertensive chronic kidney disease with stage 1 through stage 4 chronic kidney disease, or unspecified chronic kidney disease: Secondary | ICD-10-CM | POA: Diagnosis not present

## 2018-10-28 DIAGNOSIS — N189 Chronic kidney disease, unspecified: Secondary | ICD-10-CM | POA: Diagnosis not present

## 2018-10-28 DIAGNOSIS — W208XXA Other cause of strike by thrown, projected or falling object, initial encounter: Secondary | ICD-10-CM | POA: Insufficient documentation

## 2018-10-28 DIAGNOSIS — S81811A Laceration without foreign body, right lower leg, initial encounter: Secondary | ICD-10-CM | POA: Diagnosis not present

## 2018-10-28 DIAGNOSIS — Z85828 Personal history of other malignant neoplasm of skin: Secondary | ICD-10-CM | POA: Insufficient documentation

## 2018-10-28 MED ORDER — CEPHALEXIN 250 MG PO CAPS: 250.0000 mg | ORAL_CAPSULE | Freq: Three times a day (TID) | ORAL | 0 refills | Status: DC

## 2018-10-28 MED ORDER — LIDOCAINE-EPINEPHRINE (PF) 2 %-1:200000 IJ SOLN
INTRAMUSCULAR | Status: AC
  Administered 2018-10-28: 10 mL
  Filled 2018-10-28: qty 10

## 2018-10-28 MED ORDER — LIDOCAINE-EPINEPHRINE 2 %-1:100000 IJ SOLN
20.0000 mL | Freq: Once | INTRAMUSCULAR | Status: DC
  Filled 2018-10-28: qty 20

## 2018-10-28 MED ORDER — LIDOCAINE-EPINEPHRINE (PF) 2 %-1:200000 IJ SOLN
INTRAMUSCULAR | Status: AC
  Filled 2018-10-28: qty 10

## 2018-10-28 MED ORDER — CEPHALEXIN 250 MG PO CAPS
250.0000 mg | ORAL_CAPSULE | Freq: Once | ORAL | Status: AC
  Administered 2018-10-28: 250 mg via ORAL
  Filled 2018-10-28: qty 1

## 2018-10-28 NOTE — ED Notes (Signed)
ED Provider at bedside. 

## 2018-10-28 NOTE — ED Triage Notes (Signed)
Reports working with a concrete slab that weighs about 60 lbs when it fell over hitting right shin.  Abrasions noted.  Wrapped via ems.  Patient reports deep gash in leg.

## 2018-10-28 NOTE — Discharge Instructions (Addendum)
Do not get wet for the first 24 hours. After you can shower normally. Keep area clean by washing with soap and water daily. Apply a bandage at least once daily, change more often if it is dirty Watch for signs of infection (redness, drainage, worsening pain) Have stitches removed in 14 days Take Keflex 250mg  three times a day to prevent infection

## 2018-10-28 NOTE — ED Provider Notes (Signed)
[facility] EM[facility]GENCY DEPARTMENT Provider Note   CSN: [id number] Arrival date & time: 10/28/18  [place]     History   Chief Complaint Chief Complaint  Patient presents with  . Leg Injury    HPI Eric Mccarty is a 63 y.o. male who presents with a right leg laceration.  He is accompanied by his wife.  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  Patient states he was hauling a concrete slab and dropped it and it skinned his right leg tonight around 6pm.  He had immediate onset of pain and when it was unable to control the bleeding.  He takes an aspirin daily.  His wife called EMS and they were able to wrap it at night pressure. Nothing makes it better or worse.    HPI  Past Medical History:  Diagnosis Date  . Cancer (HCC)    basal cell ca lt lower leg  . Diabetes mellitus without complication ([place])   . Hx of kidney transplant 2005  . Hyperlipidemia   . Hypertension   . Renal disorder 2005   kidney transplant at [facility]    Patient Active Problem List   Diagnosis Date Noted  . DM2 (diabetes mellitus, type 2) ([place]) 08/24/2017  . Diarrhea 08/24/2017  . AKI (acute kidney injury) ([place]) 08/24/2017  . Renal transplant recipient 08/24/2017  . 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 HTN (hypertension) 08/24/2017  . 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 APPLICATION OF WOUND VAC Left 04/20/2017   Procedure: APPLICATION OF WOUND VAC LEFT LOWER EXTREMITY;  Surgeon: Wallace Going, DO;  Location: [facility];  Service: Plastics;  Laterality: Left;  . AV FISTULA PLACEMENT    . COMBINED KIDNEY-PANCREAS TRANSPLANT    . EMBOLECTOMY Left 01/15/2013   Procedure: EMBOLECTOMY Left Brachial Artery With Patch Angioplasty;  Surgeon: Elam Dutch, MD;  Location: [facility];  Service: Vascular;  Laterality: Left;  . EYE  SURGERY    . HERNIA REPAIR    . KIDNEY TRANSPLANT    . NEPHRECTOMY TRANSPLANTED ORGAN    . SKIN SPLIT GRAFT Left 05/18/2017   Procedure: SKIN GRAFT SPLIT THICKNESS FROM LEFT UPPER LEG TO LEFT LOWER LEG WOUND;  Surgeon: Wallace Going, DO;  Location: [facility];  Service: Plastics;  Laterality: Left;  . WOUND DEBRIDEMENT  05/18/2017   Procedure: DEBRIDEMENT LEFT LOWER LEG WOUND;  Surgeon: Wallace Going, DO;  Location: [facility];  Service: Plastics;;        Home Medications    Prior to Admission medications   Medication Sig Start Date End Date Taking? Authorizing Provider  amitriptyline (ELAVIL) 25 MG tablet Take 25 mg by mouth at bedtime.   Yes [provider]  amLODipine (NORVASC) 10 MG tablet Take 10 mg by mouth at bedtime. 08/07/17  Yes [provider]  aspirin 81 MG tablet Take 81 mg by mouth daily.   Yes [provider]  atorvastatin (LIPITOR) 10 MG tablet Take 10 mg by mouth daily at 6 PM.    Yes [provider]  calcitRIOL (ROCALTROL) 0.25 MCG capsule Take 0.25 mcg by mouth daily.   Yes [provider]  furosemide (LASIX) 20 MG tablet Take 60 mg by mouth 2 (two) times daily.    Yes [provider]  hydrALAZINE (APRESOLINE) 25 MG tablet Take 25 mg  by mouth 2 (two) times daily. 07/26/17  Yes [provider]  insulin glargine (LANTUS) 100 UNIT/ML injection Inject 0.05 mLs (5 Units total) into the skin at bedtime. 08/26/17  Yes Hongalgi, Lenis Dickinson, MD  labetalol (NORMODYNE) 200 MG tablet Take 200-400 mg by mouth See admin instructions. Take 1 tablet in the morning and take 2 tablets in the evening    Yes [provider]  mycophenolate (CELLCEPT) 500 MG tablet Take 500 mg by mouth 3 (three) times daily.    Yes [provider]  sodium bicarbonate 325 MG tablet Take by mouth 2 (two) times daily. Strength unknown   Yes [provider]  tacrolimus (PROGRAF) 1 MG  capsule Take 2 mg by mouth 2 (two) times daily.   Yes [provider]  cloNIDine (CATAPRES) 0.3 MG tablet Take 0.3 mg by mouth See admin instructions. Takes either three times daily or twice a daily depending on blood pressure    [provider]  sennosides-docusate sodium (SENOKOT-S) 8.6-50 MG tablet Take 1 tablet by mouth 2 (two) times daily. Reported on 03/20/2015    [provider]  sorbitol 70 % solution Take 15 mLs by mouth daily as needed (constipation).     [provider]    Family History Family History  Problem Relation Age of Onset  . Diabetes Mother   . Hypertension Mother   . Hypertension Father     Social History Social History   Tobacco Use  . Smoking status: Never Smoker  . Smokeless tobacco: Never Used  Substance Use Topics  . Alcohol use: No  . Drug use: No     Allergies   Patient has no known allergies.   Review of Systems Review of Systems  Musculoskeletal: Negative for arthralgias and myalgias.  Skin: Positive for wound.  Allergic/Immunologic: Positive for immunocompromised state (diabetes, on immunosuppresants).  Neurological: Negative for syncope.  Hematological: Does not bruise/bleed easily.  All other systems reviewed and are negative.    Physical Exam Updated Vital Signs BP (!) 195/61 (BP Location: Right Arm)   Pulse 77   Temp 98.8 F (37.1 C) (Oral)   Resp 18   Ht 5\' 6"  (1.676 m)   Wt 67.8 kg   SpO2 100%   BMI 24.13 kg/m   Physical Exam Vitals signs and nursing note reviewed.  Constitutional:      General: He is not in acute distress.    Appearance: Normal appearance. He is well-developed. He is not ill-appearing.  HENT:     Head: Normocephalic and atraumatic.  Eyes:     General: No scleral icterus.       Right eye: No discharge.        Left eye: No discharge.     Conjunctiva/sclera: Conjunctivae normal.     Pupils: Pupils are equal, round, and reactive to light.  Neck:      Musculoskeletal: Normal range of motion.  Cardiovascular:     Rate and Rhythm: Normal rate.  Pulmonary:     Effort: Pulmonary effort is normal. No respiratory distress.  Abdominal:     General: There is no distension.  Musculoskeletal:     Right lower leg: Edema present.     Left lower leg: Edema present.     Comments: Large, deep U shaped laceration over the right lower leg with blood oozing from wound  Skin:    General: Skin is warm and dry.  Neurological:     Mental Status: He is alert  and oriented to person, place, and time.  Psychiatric:        Behavior: Behavior normal.      ED Treatments / Results  Labs (all labs ordered are listed, but only abnormal results are displayed) Labs Reviewed - No data to display  EKG None  Radiology No results found.  Procedures .Marland KitchenLaceration Repair  Date/Time: 10/28/2018 9:50 PM Performed by: Recardo Evangelist, PA-C Authorized by: Recardo Evangelist, PA-C   Consent:    Consent obtained:  Verbal   Consent given by:  Patient   Risks discussed:  Infection, poor wound healing, poor cosmetic result, pain, need for additional repair and nerve damage Anesthesia (see MAR for exact dosages):    Anesthesia method:  Local infiltration   Local anesthetic:  Lidocaine 2% WITH epi Laceration details:    Location:  Leg   Leg location:  R lower leg   Length (cm):  13   Depth (mm):  20 Repair type:    Repair type:  Intermediate Pre-procedure details:    Preparation:  Patient was prepped and draped in usual sterile fashion Exploration:    Wound exploration: wound explored through full range of motion and entire depth of wound probed and visualized     Wound extent: foreign bodies/material     Wound extent: no muscle damage noted, no underlying fracture noted and no vascular damage noted     Foreign bodies/material:  Dirt   Contaminated: yes   Treatment:    Area cleansed with:  Shur-Clens and saline   Amount of cleaning:  Extensive    Irrigation solution:  Sterile water and sterile saline   Irrigation volume:  300cc   Irrigation method:  Tap and pressure wash   Visualized foreign bodies/material removed: no   Skin repair:    Repair method:  Sutures   Suture size:  4-0   Suture material:  Prolene   Suture technique:  Simple interrupted   Number of sutures:  21 Approximation:    Approximation:  Close Post-procedure details:    Dressing:  Antibiotic ointment and sterile dressing   Patient tolerance of procedure:  Tolerated well, no immediate complications   (including critical care time)    Medications Ordered in ED Medications  lidocaine-EPINEPHrine (XYLOCAINE W/EPI) 2 %-1:100000 (with pres) injection 20 mL (20 mLs Intradermal Not Given 10/28/18 1903)  lidocaine-EPINEPHrine (XYLOCAINE W/EPI) 2 %-1:200000 (PF) injection (has no administration in time range)  lidocaine-EPINEPHrine (XYLOCAINE W/EPI) 2 %-1:200000 (PF) injection (10 mLs  Given 10/28/18 1903)  cephALEXin (KEFLEX) capsule 250 mg (250 mg Oral Given 10/28/18 2054)     Initial Impression / Assessment and Plan / ED Course  I have reviewed the triage vital signs and the nursing notes.  Pertinent labs & imaging results that were available during my care of the patient were reviewed by me and considered in my medical decision making (see chart for details).  63 year old male presents with large laceration over the right lower leg after a concrete slab fell on him. Wound was copiously irrigated in the ED. Bottom of the wound visualized and bleeding controlled. 21 prolene sutures placed. Wound care discussed and advised to return to have stitches removed in 14 days. Due to his immunocompromised status I will give him rx for Keflex which was renally dosed. Return precautions discussed.   Final Clinical Impressions(s) / ED Diagnoses   Final diagnoses:  Leg laceration, right, initial encounter    ED Discharge Orders    None  Recardo Evangelist, PA-C  10/28/18 2158    Sherwood Gambler, MD 10/29/18 [phone]

## 2018-10-29 ENCOUNTER — Telehealth (HOSPITAL_BASED_OUTPATIENT_CLINIC_OR_DEPARTMENT_OTHER): Payer: Self-pay | Admitting: Emergency Medicine

## 2018-11-06 ENCOUNTER — Other Ambulatory Visit (HOSPITAL_COMMUNITY): Payer: Self-pay | Admitting: *Deleted

## 2018-11-07 ENCOUNTER — Other Ambulatory Visit: Payer: Self-pay

## 2018-11-07 ENCOUNTER — Ambulatory Visit (HOSPITAL_COMMUNITY)
Admission: RE | Admit: 2018-11-07 | Discharge: 2018-11-07 | Disposition: A | Discharge status: Home / Self Care | Payer: BC Managed Care – PPO | Source: Ambulatory Visit | Attending: Nephrology | Admitting: Nephrology

## 2018-11-07 DIAGNOSIS — D631 Anemia in chronic kidney disease: Secondary | ICD-10-CM | POA: Diagnosis present

## 2018-11-07 DIAGNOSIS — N189 Chronic kidney disease, unspecified: Secondary | ICD-10-CM | POA: Insufficient documentation

## 2018-11-07 MED ORDER — SODIUM CHLORIDE 0.9 % IV SOLN
510.0000 mg | INTRAVENOUS | Status: DC
  Administered 2018-11-07: 510 mg via INTRAVENOUS
  Filled 2018-11-07: qty 17

## 2018-11-09 ENCOUNTER — Other Ambulatory Visit: Payer: Self-pay

## 2018-11-09 ENCOUNTER — Encounter (HOSPITAL_BASED_OUTPATIENT_CLINIC_OR_DEPARTMENT_OTHER): Payer: Self-pay

## 2018-11-09 ENCOUNTER — Emergency Department (HOSPITAL_BASED_OUTPATIENT_CLINIC_OR_DEPARTMENT_OTHER)
Admission: EM | Admit: 2018-11-09 | Discharge: 2018-11-09 | Disposition: A | Discharge status: Home / Self Care | Payer: BC Managed Care – PPO | Attending: Emergency Medicine | Admitting: Emergency Medicine

## 2018-11-09 DIAGNOSIS — L03115 Cellulitis of right lower limb: Secondary | ICD-10-CM | POA: Insufficient documentation

## 2018-11-09 DIAGNOSIS — Z79899 Other long term (current) drug therapy: Secondary | ICD-10-CM | POA: Diagnosis not present

## 2018-11-09 DIAGNOSIS — Z940 Kidney transplant status: Secondary | ICD-10-CM | POA: Insufficient documentation

## 2018-11-09 DIAGNOSIS — Z7982 Long term (current) use of aspirin: Secondary | ICD-10-CM | POA: Diagnosis not present

## 2018-11-09 DIAGNOSIS — Z4802 Encounter for removal of sutures: Secondary | ICD-10-CM | POA: Diagnosis present

## 2018-11-09 DIAGNOSIS — I10 Essential (primary) hypertension: Secondary | ICD-10-CM | POA: Diagnosis not present

## 2018-11-09 DIAGNOSIS — E119 Type 2 diabetes mellitus without complications: Secondary | ICD-10-CM | POA: Diagnosis not present

## 2018-11-09 LAB — CBC WITH DIFFERENTIAL/PLATELET
Abs Immature Granulocytes: 0.06 10*3/uL (ref 0.00–0.07)
Basophils Absolute: 0 10*3/uL (ref 0.0–0.1)
Basophils Relative: 0 %
Eosinophils Absolute: 0.1 10*3/uL (ref 0.0–0.5)
Eosinophils Relative: 1 %
HCT: 27.6 % — ABNORMAL LOW (ref 39.0–52.0)
Hemoglobin: 8.3 g/dL — ABNORMAL LOW (ref 13.0–17.0)
Immature Granulocytes: 1 %
Lymphocytes Relative: 3 %
Lymphs Abs: 0.3 10*3/uL — ABNORMAL LOW (ref 0.7–4.0)
MCH: 28.8 pg (ref 26.0–34.0)
MCHC: 30.1 g/dL (ref 30.0–36.0)
MCV: 95.8 fL (ref 80.0–100.0)
Monocytes Absolute: 1 10*3/uL (ref 0.1–1.0)
Monocytes Relative: 11 %
Neutro Abs: 7.7 10*3/uL (ref 1.7–7.7)
Neutrophils Relative %: 84 %
Platelets: 311 10*3/uL (ref 150–400)
RBC: 2.88 MIL/uL — ABNORMAL LOW (ref 4.22–5.81)
RDW: 14.6 % (ref 11.5–15.5)
WBC: 9.1 10*3/uL (ref 4.0–10.5)
nRBC: 0 % (ref 0.0–0.2)

## 2018-11-09 LAB — BASIC METABOLIC PANEL
Anion gap: 10 (ref 5–15)
BUN: 49 mg/dL — ABNORMAL HIGH (ref 8–23)
CO2: 16 mmol/L — ABNORMAL LOW (ref 22–32)
Calcium: 8.3 mg/dL — ABNORMAL LOW (ref 8.9–10.3)
Chloride: 110 mmol/L (ref 98–111)
Creatinine, Ser: 2.77 mg/dL — ABNORMAL HIGH (ref 0.61–1.24)
GFR calc Af Amer: 27 mL/min — ABNORMAL LOW (ref >60)
GFR calc non Af Amer: 24 mL/min — ABNORMAL LOW (ref >60)
Glucose, Bld: 140 mg/dL — ABNORMAL HIGH (ref 70–99)
Potassium: 4.6 mmol/L (ref 3.5–5.1)
Sodium: 136 mmol/L (ref 135–145)

## 2018-11-09 LAB — LACTIC ACID, PLASMA: Lactic Acid, Venous: 1 mmol/L (ref 0.5–1.9)

## 2018-11-09 MED ORDER — DOXYCYCLINE HYCLATE 100 MG PO CAPS: 100.0000 mg | ORAL_CAPSULE | Freq: Two times a day (BID) | ORAL | 0 refills | Status: DC

## 2018-11-09 MED FILL — DOXYCYCLINE HYCLATE 100 MG: 100 | 10 days supply | Qty: 20 | Fill #0

## 2018-11-09 NOTE — ED Provider Notes (Signed)
[facility] EMERGENCY DEPARTMENT Provider Note   CSN: [id number] Arrival date & time: 11/09/18  1311    History   Chief Complaint Chief Complaint  Patient presents with  . Follow-up    HPI Eric Mccarty is a 63 y.o. male.     The history is provided by the patient and medical records. No language interpreter was used.   Eric Mccarty is a 63 y.o. male  with a PMH of DM, prior kidney transplant who presents to the Emergency Department complaining of wound to the right lower extremity.  Patient was seen in the emergency department on 8/01 for laceration where wound was repaired with multiple sutures.  He noticed some wet drainage from the wound a few days ago.  He had an iron infusion on 8/11 (2 days ago) where he let the staff look at the wound there.  They told him that it looked as if it needed to dry out a little more, so he left it uncovered that night.  He reports over the last 2 days, the color has changed and is now a darker brown color instead of a flesh color.  He has noticed some white drainage seeping between his suturing as well.  He has not had any fever or chills.  Denies any systemic symptoms.  He has been compliant with the Keflex antibiotic prophylaxis which was provided in the ED on 8/1.   Past Medical History:  Diagnosis Date  . Cancer (HCC)    basal cell ca lt lower leg  . Diabetes mellitus without complication ([place])   . Hx of kidney transplant 2005  . Hyperlipidemia   . Hypertension   . Renal disorder 2005   kidney transplant at [facility]    Patient Active Problem List   Diagnosis Date Noted  . DM2 (diabetes mellitus, type 2) ([place]) 08/24/2017  . Diarrhea 08/24/2017  . AKI (acute kidney injury) ([place]) 08/24/2017  . Renal transplant recipient 08/24/2017  . 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 HTN (hypertension) 08/24/2017  . 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 APPLICATION OF  WOUND VAC Left 04/20/2017   Procedure: APPLICATION OF WOUND VAC LEFT LOWER EXTREMITY;  Surgeon: Wallace Going, DO;  Location: [facility];  Service: Plastics;  Laterality: Left;  . AV FISTULA PLACEMENT    . COMBINED KIDNEY-PANCREAS TRANSPLANT    . EMBOLECTOMY Left 01/15/2013   Procedure: EMBOLECTOMY Left Brachial Artery With Patch Angioplasty;  Surgeon: Elam Dutch, MD;  Location: [facility];  Service: Vascular;  Laterality: Left;  . EYE SURGERY    . HERNIA REPAIR    . KIDNEY TRANSPLANT    . NEPHRECTOMY TRANSPLANTED ORGAN    . SKIN SPLIT GRAFT Left 05/18/2017   Procedure: SKIN GRAFT SPLIT THICKNESS FROM LEFT UPPER LEG TO LEFT LOWER LEG WOUND;  Surgeon: Wallace Going, DO;  Location: [facility];  Service: Plastics;  Laterality: Left;  . WOUND DEBRIDEMENT  05/18/2017   Procedure: DEBRIDEMENT LEFT LOWER LEG WOUND;  Surgeon: Wallace Going, DO;  Location: [facility];  Service: Plastics;;        Home Medications    Prior to Admission medications   Medication Sig Start Date End Date Taking? Authorizing Provider  amitriptyline (ELAVIL) 25 MG tablet Take 25 mg by mouth at bedtime.    [provider]  amLODipine (NORVASC) 10 MG tablet Take 10 mg by  mouth at bedtime. 08/07/17   [provider]  aspirin 81 MG tablet Take 81 mg by mouth daily.    [provider]  atorvastatin (LIPITOR) 10 MG tablet Take 10 mg by mouth daily at 6 PM.     [provider]  calcitRIOL (ROCALTROL) 0.25 MCG capsule Take 0.25 mcg by mouth daily.    [provider]  cephALEXin (KEFLEX) 250 MG capsule Take 1 capsule (250 mg total) by mouth 3 (three) times daily. 10/28/18   Recardo Evangelist, PA-C  cloNIDine (CATAPRES) 0.3 MG tablet Take 0.3 mg by mouth See admin instructions. Takes either three times daily or twice a daily depending on blood pressure    [provider]  doxycycline (VIBRAMYCIN) 100 MG capsule Take  1 capsule (100 mg total) by mouth 2 (two) times daily. 11/09/18   Kaitlin Ardito, Ozella Almond, PA-C  furosemide (LASIX) 20 MG tablet Take 60 mg by mouth 2 (two) times daily.     [provider]  hydrALAZINE (APRESOLINE) 25 MG tablet Take 25 mg by mouth 2 (two) times daily. 07/26/17   [provider]  insulin glargine (LANTUS) 100 UNIT/ML injection Inject 0.05 mLs (5 Units total) into the skin at bedtime. 08/26/17   Hongalgi, Lenis Dickinson, MD  labetalol (NORMODYNE) 200 MG tablet Take 200-400 mg by mouth See admin instructions. Take 1 tablet in the morning and take 2 tablets in the evening     [provider]  mycophenolate (CELLCEPT) 500 MG tablet Take 500 mg by mouth 3 (three) times daily.     [provider]  sennosides-docusate sodium (SENOKOT-S) 8.6-50 MG tablet Take 1 tablet by mouth 2 (two) times daily. Reported on 03/20/2015    [provider]  sodium bicarbonate 325 MG tablet Take by mouth 2 (two) times daily. Strength unknown    [provider]  sorbitol 70 % solution Take 15 mLs by mouth daily as needed (constipation).     [provider]  tacrolimus (PROGRAF) 1 MG capsule Take 2 mg by mouth 2 (two) times daily.    [provider]    Family History Family History  Problem Relation Age of Onset  . Diabetes Mother   . Hypertension Mother   . Hypertension Father     Social History Social History   Tobacco Use  . Smoking status: Never Smoker  . Smokeless tobacco: Never Used  Substance Use Topics  . Alcohol use: No  . Drug use: No     Allergies   Patient has no known allergies.   Review of Systems Review of Systems  Musculoskeletal: Positive for myalgias.  Skin: Positive for color change and wound.  All other systems reviewed and are negative.    Physical Exam Updated Vital Signs BP (!) 195/56 (BP Location: Right Arm)   Pulse 73   Temp 98.6 F (37 C) (Oral)   Resp 18   Ht 5\' 7"  (1.702 m)   Wt 68.3 kg    SpO2 100%   BMI 23.57 kg/m   Physical Exam Vitals signs and nursing note reviewed.  Constitutional:      General: He is not in acute distress.    Appearance: He is well-developed.     Comments: Non-toxic appearing  HENT:     Head: Normocephalic and atraumatic.  Neck:     Musculoskeletal: Neck supple.  Cardiovascular:     Rate and Rhythm: Normal rate and regular rhythm.     Heart sounds: Normal heart  sounds. No murmur.  Pulmonary:     Effort: Pulmonary effort is normal. No respiratory distress.     Breath sounds: Normal breath sounds.  Skin:    General: Skin is warm and dry.     Comments: See images of right lower leg below.  Top picture is day of closure on 8/01.  Bottom picture is today 8/13.  Neurological:     Mental Status: He is alert and oriented to person, place, and time.          ED Treatments / Results  Labs (all labs ordered are listed, but only abnormal results are displayed) Labs Reviewed  CBC WITH DIFFERENTIAL/PLATELET - Abnormal; Notable for the following components:      Result Value   RBC 2.88 (*)    Hemoglobin 8.3 (*)    HCT 27.6 (*)    Lymphs Abs 0.3 (*)    All other components within normal limits  BASIC METABOLIC PANEL - Abnormal; Notable for the following components:   CO2 16 (*)    Glucose, Bld 140 (*)    BUN 49 (*)    Creatinine, Ser 2.77 (*)    Calcium 8.3 (*)    GFR calc non Af Amer 24 (*)    GFR calc Af Amer 27 (*)    All other components within normal limits  CULTURE, BLOOD (ROUTINE X 2)  CULTURE, BLOOD (ROUTINE X 2)  LACTIC ACID, PLASMA  LACTIC ACID, PLASMA    EKG None  Radiology No results found.  Procedures .Suture Removal  Date/Time: 11/09/2018 3:33 PM Performed by: Zachrey Deutscher, Ozella Almond, PA-C Authorized by: Aahna Rossa, Ozella Almond, PA-C   Consent:    Consent obtained:  Verbal   Consent given by:  Patient   Risks discussed:  Bleeding, pain and wound separation   Alternatives discussed:  No treatment and delayed  treatment Location:    Location:  Lower extremity   Lower extremity location:  Leg   Leg location:  R lower leg Procedure details:    Wound appearance:  Red and draining   Number of sutures removed:  21 Post-procedure details:    Post-removal:  Dressing applied   Patient tolerance of procedure:  Tolerated well, no immediate complications   (including critical care time)  Medications Ordered in ED Medications - No data to display   Initial Impression / Assessment and Plan / ED Course  I have reviewed the triage vital signs and the nursing notes.  Pertinent labs & imaging results that were available during my care of the patient were reviewed by me and considered in my medical decision making (see chart for details).       Eric Mccarty is a 63 y.o. male who presents to ED for wound check to right lower extremity.  Chart extensively reviewed.  Seen in the emergency department on 8/01. Started on Keflex at that time.  He does have some surrounding erythema at this site as well as mild drainage from between suture wounds.  He does not have any streaking up the leg and the erythema is localized right around the wound itself.  He has a normal white count and lactic.  Blood cultures were obtained given his immune compromised state with prior kidney transplant and history of diabetes.  Hard to decipher if this is cellulitis versus poor wound healing.  Will refer to the wound clinic.  Discussed trial of doxycycline with better coverage versus admission for IV antibiotics.  Will treat with doxycycline with  close follow-up in the next 2 to 3 days for wound check.  Reasons to return to the ER sooner were discussed at length with patient and spouse.  All questions answered.  Patient seen by and discussed with Dr. Maryan Rued who agrees with treatment plan.    Final Clinical Impressions(s) / ED Diagnoses   Final diagnoses:  Cellulitis of right lower extremity    ED Discharge Orders          Ordered    doxycycline (VIBRAMYCIN) 100 MG capsule  2 times daily     11/09/18 1530           Armany Mano, Ozella Almond, PA-C 11/09/18 1536    Blanchie Dessert, MD 11/10/18 [phone]

## 2018-11-09 NOTE — ED Triage Notes (Signed)
Pt for recheck of right LE injury with sutures-was seen at [facility] yesterday-was advied to come back to ED for IV abx-NAD-steady gait

## 2018-11-09 NOTE — Discharge Instructions (Signed)
It was my pleasure taking care of you today!   Please take all of your antibiotics until finished!   Apply Vaseline to the wound.   Call the wound care clinic. I would also like you to call Dr. Moshe Cipro and she may be able to help get in with wound care. Someone needs to take a look at the wound in 2-3 days, whether it is a your primary care doctor's office or the wound care clinic.  Return to the emergency department for fevers, worsening redness, new or worsening symptoms, any additional concerns.

## 2018-11-14 ENCOUNTER — Other Ambulatory Visit: Payer: Self-pay

## 2018-11-14 ENCOUNTER — Ambulatory Visit (HOSPITAL_COMMUNITY)
Admission: RE | Admit: 2018-11-14 | Discharge: 2018-11-14 | Disposition: A | Discharge status: Home / Self Care | Payer: BC Managed Care – PPO | Source: Ambulatory Visit | Attending: Nephrology | Admitting: Nephrology

## 2018-11-14 DIAGNOSIS — D631 Anemia in chronic kidney disease: Secondary | ICD-10-CM | POA: Insufficient documentation

## 2018-11-14 LAB — CULTURE, BLOOD (ROUTINE X 2)
Culture: NO GROWTH
Culture: NO GROWTH
Special Requests: ADEQUATE
Special Requests: ADEQUATE

## 2018-11-14 MED ORDER — SODIUM CHLORIDE 0.9 % IV SOLN
510.0000 mg | INTRAVENOUS | Status: AC
  Administered 2018-11-14: 510 mg via INTRAVENOUS
  Filled 2018-11-14: qty 17

## 2019-05-15 ENCOUNTER — Ambulatory Visit: Payer: BC Managed Care – PPO | Attending: Internal Medicine

## 2019-05-15 DIAGNOSIS — Z23 Encounter for immunization: Secondary | ICD-10-CM | POA: Insufficient documentation

## 2019-05-15 NOTE — Progress Notes (Signed)
   Covid-19 Vaccination Clinic  Name:  Eric Mccarty    MRN: [id number] DOB: 02-17-1956  05/15/2019  [name] was observed post Covid-19 immunization for 15 minutes without incidence. He was provided with Vaccine Information Sheet and instruction to access the V-Safe system.   [name] was instructed to call 911 with any severe reactions post vaccine: Marland Kitchen Difficulty breathing  . Swelling of your face and throat  . A fast heartbeat  . A bad rash all over your body  . Dizziness and weakness    Immunizations Administered    Name Date Dose VIS Date Route   Moderna COVID-19 Vaccine 05/15/2019 12:21 PM 0.5 mL 02/27/2019 Intramuscular   Manufacturer: Moderna   Lot: [id number]   [place]: [phone]

## 2019-06-13 ENCOUNTER — Ambulatory Visit: Payer: Self-pay | Attending: Internal Medicine

## 2019-06-13 DIAGNOSIS — Z23 Encounter for immunization: Secondary | ICD-10-CM

## 2019-06-13 NOTE — Progress Notes (Signed)
   Covid-19 Vaccination Clinic  Name:  Eric Mccarty    MRN: [id number] DOB: December 29, 1955  06/13/2019  [name] was observed post Covid-19 immunization for 15 minutes without incident. He was provided with Vaccine Information Sheet and instruction to access the V-Safe system.   [name] was instructed to call 911 with any severe reactions post vaccine: Marland Kitchen Difficulty breathing  . Swelling of face and throat  . A fast heartbeat  . A bad rash all over body  . Dizziness and weakness   Immunizations Administered    Name Date Dose VIS Date Route   Moderna COVID-19 Vaccine 06/13/2019 11:08 AM 0.5 mL 02/27/2019 Intramuscular   Manufacturer: Moderna   Lot: [id number]   [place]: [phone]

## 2019-11-14 ENCOUNTER — Other Ambulatory Visit: Payer: Self-pay

## 2019-11-14 ENCOUNTER — Emergency Department (HOSPITAL_BASED_OUTPATIENT_CLINIC_OR_DEPARTMENT_OTHER)
Admission: EM | Admit: 2019-11-14 | Discharge: 2019-11-14 | Disposition: A | Discharge status: Home / Self Care | Payer: BC Managed Care – PPO | Attending: Emergency Medicine | Admitting: Emergency Medicine

## 2019-11-14 ENCOUNTER — Encounter (HOSPITAL_BASED_OUTPATIENT_CLINIC_OR_DEPARTMENT_OTHER): Payer: Self-pay | Admitting: Emergency Medicine

## 2019-11-14 DIAGNOSIS — Z940 Kidney transplant status: Secondary | ICD-10-CM | POA: Insufficient documentation

## 2019-11-14 DIAGNOSIS — E119 Type 2 diabetes mellitus without complications: Secondary | ICD-10-CM | POA: Insufficient documentation

## 2019-11-14 DIAGNOSIS — Y93I9 Activity, other involving external motion: Secondary | ICD-10-CM | POA: Insufficient documentation

## 2019-11-14 DIAGNOSIS — Z79899 Other long term (current) drug therapy: Secondary | ICD-10-CM | POA: Diagnosis not present

## 2019-11-14 DIAGNOSIS — Y999 Unspecified external cause status: Secondary | ICD-10-CM | POA: Insufficient documentation

## 2019-11-14 DIAGNOSIS — Z85828 Personal history of other malignant neoplasm of skin: Secondary | ICD-10-CM | POA: Diagnosis not present

## 2019-11-14 DIAGNOSIS — M2560 Stiffness of unspecified joint, not elsewhere classified: Secondary | ICD-10-CM | POA: Diagnosis not present

## 2019-11-14 DIAGNOSIS — Z7982 Long term (current) use of aspirin: Secondary | ICD-10-CM | POA: Diagnosis not present

## 2019-11-14 DIAGNOSIS — I10 Essential (primary) hypertension: Secondary | ICD-10-CM | POA: Insufficient documentation

## 2019-11-14 DIAGNOSIS — Z9483 Pancreas transplant status: Secondary | ICD-10-CM | POA: Insufficient documentation

## 2019-11-14 DIAGNOSIS — Y92410 Unspecified street and highway as the place of occurrence of the external cause: Secondary | ICD-10-CM | POA: Diagnosis not present

## 2019-11-14 DIAGNOSIS — Z794 Long term (current) use of insulin: Secondary | ICD-10-CM | POA: Diagnosis not present

## 2019-11-14 NOTE — ED Notes (Signed)
Pt discharged to home. Discharge instructions have been discussed with patient and/or family members. Pt verbally acknowledges understanding d/c instructions, and endorses comprehension to checkout at registration before leaving.  °
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
## 2019-11-14 NOTE — ED Notes (Signed)
ED Provider at bedside. 
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
## 2020-10-06 DIAGNOSIS — E782 Mixed hyperlipidemia: Secondary | ICD-10-CM | POA: Insufficient documentation

## 2020-10-06 DIAGNOSIS — N184 Chronic kidney disease, stage 4 (severe): Secondary | ICD-10-CM | POA: Insufficient documentation

## 2020-10-06 DIAGNOSIS — I151 Hypertension secondary to other renal disorders: Secondary | ICD-10-CM | POA: Insufficient documentation

## 2020-10-06 DIAGNOSIS — Z01818 Encounter for other preprocedural examination: Secondary | ICD-10-CM | POA: Insufficient documentation

## 2020-10-16 DIAGNOSIS — E103553 Type 1 diabetes mellitus with stable proliferative diabetic retinopathy, bilateral: Secondary | ICD-10-CM | POA: Insufficient documentation

## 2020-11-27 DIAGNOSIS — N432 Other hydrocele: Secondary | ICD-10-CM | POA: Insufficient documentation

## 2020-11-28 ENCOUNTER — Other Ambulatory Visit (HOSPITAL_COMMUNITY): Payer: Self-pay | Admitting: *Deleted

## 2020-12-02 ENCOUNTER — Encounter (HOSPITAL_COMMUNITY)
Admission: RE | Admit: 2020-12-02 | Discharge: 2020-12-02 | Disposition: A | Discharge status: Home / Self Care | Payer: BC Managed Care – PPO | Source: Ambulatory Visit | Attending: Nephrology | Admitting: Nephrology

## 2020-12-02 ENCOUNTER — Encounter (HOSPITAL_COMMUNITY): Payer: BC Managed Care – PPO

## 2020-12-02 ENCOUNTER — Other Ambulatory Visit: Payer: Self-pay

## 2020-12-02 DIAGNOSIS — N189 Chronic kidney disease, unspecified: Secondary | ICD-10-CM | POA: Diagnosis not present

## 2020-12-02 DIAGNOSIS — D631 Anemia in chronic kidney disease: Secondary | ICD-10-CM | POA: Diagnosis not present

## 2020-12-02 MED ORDER — EPOETIN ALFA-EPBX 10000 UNIT/ML IJ SOLN
20000.0000 [IU] | INTRAMUSCULAR | Status: DC
  Administered 2020-12-02: 20000 [IU] via SUBCUTANEOUS

## 2020-12-02 MED ORDER — EPOETIN ALFA-EPBX 10000 UNIT/ML IJ SOLN
INTRAMUSCULAR | Status: AC
  Filled 2020-12-02: qty 2

## 2020-12-02 NOTE — Progress Notes (Signed)
Hemocue 7.9 and Shaquina Ford at [facility] and no new orders noted

## 2020-12-03 LAB — POCT HEMOGLOBIN-HEMACUE: Hemoglobin: 7.9 g/dL — ABNORMAL LOW (ref 13.0–17.0)

## 2020-12-09 ENCOUNTER — Encounter (HOSPITAL_COMMUNITY): Payer: BC Managed Care – PPO

## 2020-12-15 ENCOUNTER — Other Ambulatory Visit (HOSPITAL_COMMUNITY): Payer: Self-pay | Admitting: *Deleted

## 2020-12-15 ENCOUNTER — Encounter: Payer: Self-pay | Admitting: Nephrology

## 2020-12-15 DIAGNOSIS — D631 Anemia in chronic kidney disease: Secondary | ICD-10-CM | POA: Insufficient documentation

## 2020-12-15 DIAGNOSIS — E611 Iron deficiency: Secondary | ICD-10-CM | POA: Insufficient documentation

## 2020-12-16 ENCOUNTER — Other Ambulatory Visit: Payer: Self-pay

## 2020-12-16 ENCOUNTER — Encounter (HOSPITAL_COMMUNITY)
Admission: RE | Admit: 2020-12-16 | Discharge: 2020-12-16 | Disposition: A | Discharge status: Home / Self Care | Payer: BC Managed Care – PPO | Source: Ambulatory Visit | Attending: Nephrology | Admitting: Nephrology

## 2020-12-16 DIAGNOSIS — N189 Chronic kidney disease, unspecified: Secondary | ICD-10-CM | POA: Diagnosis not present

## 2020-12-16 LAB — POCT HEMOGLOBIN-HEMACUE: Hemoglobin: 9 g/dL — ABNORMAL LOW (ref 13.0–17.0)

## 2020-12-16 MED ORDER — EPOETIN ALFA-EPBX 10000 UNIT/ML IJ SOLN
INTRAMUSCULAR | Status: AC
  Filled 2020-12-16: qty 2

## 2020-12-16 MED ORDER — EPOETIN ALFA-EPBX 10000 UNIT/ML IJ SOLN
20000.0000 [IU] | INTRAMUSCULAR | Status: DC
  Administered 2020-12-16: 20000 [IU] via SUBCUTANEOUS

## 2020-12-30 ENCOUNTER — Other Ambulatory Visit: Payer: Self-pay

## 2020-12-30 ENCOUNTER — Encounter (HOSPITAL_COMMUNITY)
Admission: RE | Admit: 2020-12-30 | Discharge: 2020-12-30 | Disposition: A | Discharge status: Home / Self Care | Payer: BC Managed Care – PPO | Source: Ambulatory Visit | Attending: Nephrology | Admitting: Nephrology

## 2020-12-30 VITALS — BP 177/52 | HR 78 | Temp 97.0°F | Resp 18

## 2020-12-30 DIAGNOSIS — N189 Chronic kidney disease, unspecified: Secondary | ICD-10-CM | POA: Diagnosis not present

## 2020-12-30 DIAGNOSIS — D631 Anemia in chronic kidney disease: Secondary | ICD-10-CM | POA: Insufficient documentation

## 2020-12-30 LAB — FERRITIN: Ferritin: 86 ng/mL (ref 24–336)

## 2020-12-30 LAB — POCT HEMOGLOBIN-HEMACUE: Hemoglobin: 9.4 g/dL — ABNORMAL LOW (ref 13.0–17.0)

## 2020-12-30 LAB — IRON AND TIBC
Iron: 75 ug/dL (ref 45–182)
Saturation Ratios: 27 % (ref 17.9–39.5)
TIBC: 281 ug/dL (ref 250–450)
UIBC: 206 ug/dL

## 2020-12-30 MED ORDER — EPOETIN ALFA-EPBX 10000 UNIT/ML IJ SOLN: 20000.0000 [IU] | INTRAMUSCULAR | Status: DC

## 2020-12-30 MED ORDER — EPOETIN ALFA-EPBX 10000 UNIT/ML IJ SOLN
INTRAMUSCULAR | Status: AC
  Administered 2020-12-30: 20000 [IU] via SUBCUTANEOUS
  Filled 2020-12-30: qty 2

## 2021-01-13 ENCOUNTER — Encounter (HOSPITAL_COMMUNITY): Payer: BC Managed Care – PPO

## 2021-01-22 ENCOUNTER — Other Ambulatory Visit: Payer: Self-pay

## 2021-01-22 ENCOUNTER — Encounter (HOSPITAL_COMMUNITY)
Admission: RE | Admit: 2021-01-22 | Discharge: 2021-01-22 | Disposition: A | Discharge status: Home / Self Care | Payer: BC Managed Care – PPO | Source: Ambulatory Visit | Attending: Nephrology | Admitting: Nephrology

## 2021-01-22 VITALS — BP 191/49 | HR 78 | Temp 98.3°F | Resp 18

## 2021-01-22 DIAGNOSIS — N189 Chronic kidney disease, unspecified: Secondary | ICD-10-CM | POA: Diagnosis not present

## 2021-01-22 DIAGNOSIS — D631 Anemia in chronic kidney disease: Secondary | ICD-10-CM

## 2021-01-22 LAB — POCT HEMOGLOBIN-HEMACUE: Hemoglobin: 9.7 g/dL — ABNORMAL LOW (ref 13.0–17.0)

## 2021-01-22 MED ORDER — CLONIDINE HCL 0.1 MG PO TABS: 0.1000 mg | ORAL_TABLET | Freq: Once | ORAL | Status: DC

## 2021-01-22 MED ORDER — EPOETIN ALFA-EPBX 10000 UNIT/ML IJ SOLN
20000.0000 [IU] | INTRAMUSCULAR | Status: DC
  Administered 2021-01-22: 20000 [IU] via SUBCUTANEOUS

## 2021-01-22 MED ORDER — CLONIDINE HCL 0.1 MG PO TABS
ORAL_TABLET | ORAL | Status: AC
  Filled 2021-01-22: qty 1

## 2021-01-22 MED ORDER — EPOETIN ALFA-EPBX 10000 UNIT/ML IJ SOLN
INTRAMUSCULAR | Status: AC
  Filled 2021-01-22: qty 2

## 2021-01-22 NOTE — Progress Notes (Signed)
Spoke with Museum/gallery conservator at [employer] re: elevated bp 190/56 after clonidine . Per Dr .Moshe Cipro it was okay to give injection as ordered and she will look at patients meds.

## 2021-02-02 ENCOUNTER — Emergency Department (HOSPITAL_COMMUNITY): Payer: BC Managed Care – PPO

## 2021-02-02 ENCOUNTER — Other Ambulatory Visit: Payer: Self-pay

## 2021-02-02 ENCOUNTER — Emergency Department (HOSPITAL_COMMUNITY)
Admission: EM | Admit: 2021-02-02 | Discharge: 2021-02-03 | Disposition: A | Discharge status: Home / Self Care | Payer: BC Managed Care – PPO | Attending: Emergency Medicine | Admitting: Emergency Medicine

## 2021-02-02 DIAGNOSIS — Z79899 Other long term (current) drug therapy: Secondary | ICD-10-CM | POA: Diagnosis not present

## 2021-02-02 DIAGNOSIS — I10 Essential (primary) hypertension: Secondary | ICD-10-CM | POA: Insufficient documentation

## 2021-02-02 DIAGNOSIS — Z85828 Personal history of other malignant neoplasm of skin: Secondary | ICD-10-CM | POA: Insufficient documentation

## 2021-02-02 DIAGNOSIS — R4182 Altered mental status, unspecified: Secondary | ICD-10-CM | POA: Insufficient documentation

## 2021-02-02 DIAGNOSIS — Z7982 Long term (current) use of aspirin: Secondary | ICD-10-CM | POA: Diagnosis not present

## 2021-02-02 DIAGNOSIS — Z794 Long term (current) use of insulin: Secondary | ICD-10-CM | POA: Diagnosis not present

## 2021-02-02 DIAGNOSIS — E119 Type 2 diabetes mellitus without complications: Secondary | ICD-10-CM | POA: Insufficient documentation

## 2021-02-02 DIAGNOSIS — R2681 Unsteadiness on feet: Secondary | ICD-10-CM | POA: Diagnosis not present

## 2021-02-02 LAB — CBC WITH DIFFERENTIAL/PLATELET
Abs Immature Granulocytes: 0.02 10*3/uL (ref 0.00–0.07)
Basophils Absolute: 0 10*3/uL (ref 0.0–0.1)
Basophils Relative: 0 %
Eosinophils Absolute: 0.3 10*3/uL (ref 0.0–0.5)
Eosinophils Relative: 5 %
HCT: 30.4 % — ABNORMAL LOW (ref 39.0–52.0)
Hemoglobin: 9.2 g/dL — ABNORMAL LOW (ref 13.0–17.0)
Immature Granulocytes: 0 %
Lymphocytes Relative: 6 %
Lymphs Abs: 0.3 10*3/uL — ABNORMAL LOW (ref 0.7–4.0)
MCH: 30.3 pg (ref 26.0–34.0)
MCHC: 30.3 g/dL (ref 30.0–36.0)
MCV: 100 fL (ref 80.0–100.0)
Monocytes Absolute: 0.6 10*3/uL (ref 0.1–1.0)
Monocytes Relative: 12 %
Neutro Abs: 4 10*3/uL (ref 1.7–7.7)
Neutrophils Relative %: 77 %
Platelets: 148 10*3/uL — ABNORMAL LOW (ref 150–400)
RBC: 3.04 MIL/uL — ABNORMAL LOW (ref 4.22–5.81)
RDW: 15.4 % (ref 11.5–15.5)
WBC: 5.2 10*3/uL (ref 4.0–10.5)
nRBC: 0 % (ref 0.0–0.2)

## 2021-02-02 LAB — I-STAT CHEM 8, ED
BUN: 55 mg/dL — ABNORMAL HIGH (ref 8–23)
Calcium, Ion: 1.12 mmol/L — ABNORMAL LOW (ref 1.15–1.40)
Chloride: 113 mmol/L — ABNORMAL HIGH (ref 98–111)
Creatinine, Ser: 7.5 mg/dL — ABNORMAL HIGH (ref 0.61–1.24)
Glucose, Bld: 116 mg/dL — ABNORMAL HIGH (ref 70–99)
HCT: 32 % — ABNORMAL LOW (ref 39.0–52.0)
Hemoglobin: 10.9 g/dL — ABNORMAL LOW (ref 13.0–17.0)
Potassium: 3.8 mmol/L (ref 3.5–5.1)
Sodium: 141 mmol/L (ref 135–145)
TCO2: 16 mmol/L — ABNORMAL LOW (ref 22–32)

## 2021-02-02 LAB — COMPREHENSIVE METABOLIC PANEL
ALT: 15 U/L (ref 0–44)
AST: 14 U/L — ABNORMAL LOW (ref 15–41)
Albumin: 3.4 g/dL — ABNORMAL LOW (ref 3.5–5.0)
Alkaline Phosphatase: 107 U/L (ref 38–126)
Anion gap: 10 (ref 5–15)
BUN: 60 mg/dL — ABNORMAL HIGH (ref 8–23)
CO2: 15 mmol/L — ABNORMAL LOW (ref 22–32)
Calcium: 7.9 mg/dL — ABNORMAL LOW (ref 8.9–10.3)
Chloride: 112 mmol/L — ABNORMAL HIGH (ref 98–111)
Creatinine, Ser: 6.78 mg/dL — ABNORMAL HIGH (ref 0.61–1.24)
GFR, Estimated: 9 mL/min — ABNORMAL LOW (ref >60)
Glucose, Bld: 200 mg/dL — ABNORMAL HIGH (ref 70–99)
Potassium: 4.3 mmol/L (ref 3.5–5.1)
Sodium: 137 mmol/L (ref 135–145)
Total Bilirubin: 0.7 mg/dL (ref 0.3–1.2)
Total Protein: 5.5 g/dL — ABNORMAL LOW (ref 6.5–8.1)

## 2021-02-02 LAB — RAPID URINE DRUG SCREEN, HOSP PERFORMED
Amphetamines: NOT DETECTED
Barbiturates: NOT DETECTED
Benzodiazepines: NOT DETECTED
Cocaine: NOT DETECTED
Opiates: NOT DETECTED
Tetrahydrocannabinol: NOT DETECTED

## 2021-02-02 LAB — URINALYSIS, ROUTINE W REFLEX MICROSCOPIC
Bacteria, UA: NONE SEEN
Bilirubin Urine: NEGATIVE
Glucose, UA: 50 mg/dL — AB
Ketones, ur: NEGATIVE mg/dL
Leukocytes,Ua: NEGATIVE
Nitrite: NEGATIVE
Protein, ur: 100 mg/dL — AB
Specific Gravity, Urine: 1.01 (ref 1.005–1.030)
pH: 6 (ref 5.0–8.0)

## 2021-02-02 LAB — CBC
HCT: 32.3 % — ABNORMAL LOW (ref 39.0–52.0)
Hemoglobin: 9.9 g/dL — ABNORMAL LOW (ref 13.0–17.0)
MCH: 30.4 pg (ref 26.0–34.0)
MCHC: 30.7 g/dL (ref 30.0–36.0)
MCV: 99.1 fL (ref 80.0–100.0)
Platelets: 155 10*3/uL (ref 150–400)
RBC: 3.26 MIL/uL — ABNORMAL LOW (ref 4.22–5.81)
RDW: 15.3 % (ref 11.5–15.5)
WBC: 5.6 10*3/uL (ref 4.0–10.5)
nRBC: 0 % (ref 0.0–0.2)

## 2021-02-02 LAB — AMMONIA: Ammonia: 22 umol/L (ref 9–35)

## 2021-02-02 LAB — DIFFERENTIAL
Abs Immature Granulocytes: 0.02 10*3/uL (ref 0.00–0.07)
Basophils Absolute: 0 10*3/uL (ref 0.0–0.1)
Basophils Relative: 0 %
Eosinophils Absolute: 0.3 10*3/uL (ref 0.0–0.5)
Eosinophils Relative: 5 %
Immature Granulocytes: 0 %
Lymphocytes Relative: 6 %
Lymphs Abs: 0.3 10*3/uL — ABNORMAL LOW (ref 0.7–4.0)
Monocytes Absolute: 0.6 10*3/uL (ref 0.1–1.0)
Monocytes Relative: 11 %
Neutro Abs: 4.4 10*3/uL (ref 1.7–7.7)
Neutrophils Relative %: 78 %

## 2021-02-02 LAB — APTT: aPTT: 31 seconds (ref 24–36)

## 2021-02-02 LAB — LIPASE, BLOOD: Lipase: 48 U/L (ref 11–51)

## 2021-02-02 LAB — PROTIME-INR
INR: 1.1 (ref 0.8–1.2)
Prothrombin Time: 14.4 seconds (ref 11.4–15.2)

## 2021-02-02 LAB — ETHANOL: Alcohol, Ethyl (B): <10 mg/dL (ref <10)

## 2021-02-02 NOTE — ED Provider Notes (Signed)
Emergency Medicine Provider Triage Evaluation Note  Eric Mccarty , a 65 y.o. male  was evaluated in triage.  Pt complains of AMS.  Had eye surgery last week and developed increased intraocular pressures.  Is currently on acetazolamide in addition to other eyedrops.  Been increasingly confused.  History of pancreatic and renal transplant.  No fever or chills  Review of Systems  Positive:  Negative: See above  Physical Exam  BP (!) 171/55 (BP Location: Right Arm)   Pulse (!) 54   Temp 97.8 F (36.6 C) (Oral)   Resp 18   SpO2 100%  Gen:   Awake, no distress   Resp:  Normal effort  MSK:   Moves extremities without difficulty  Other:  Alert and oriented x4.  Medical Decision Making  Medically screening exam initiated at 12:39 PM.  Appropriate orders placed.  Eric Mccarty was informed that the remainder of the evaluation will be completed by another provider, this initial triage assessment does not replace that evaluation, and the importance of remaining in the ED until their evaluation is complete.     Myna Bright Campbell, PA-C 02/02/21 1241    Daleen Bo, MD 02/02/21 2043

## 2021-02-02 NOTE — ED Triage Notes (Signed)
Pt dx with blood clot in eye s/p cataract surgery on 10/18 and has now been confused x 1 week, progressively worsening per family and pt is sleeping all the time. Family also reports unsteady gait. A/O x 4 in triage.

## 2021-02-02 NOTE — Discharge Instructions (Addendum)
Work-up for the intermittent states of confusion without any acute findings.  Continue to follow-up with your ophthalmologist and back to your primary care doctor.  No evidence of any acute stroke.  Creatinine significantly worse.  Call your nephrologist tomorrow for follow-up.  Will need close follow-up with them.

## 2021-02-02 NOTE — ED Provider Notes (Signed)
[facility] EMERGENCY DEPARTMENT Provider Note   CSN: [id number] Arrival date & time: 02/02/21  1120     History Chief Complaint  Patient presents with   Altered Mental Status    Eric Mccarty is a 65 y.o. male.  Patient was in to see his ophthalmologist today.  Patient had cataract surgery on October 18.  Has been having some trouble with increased eye pressure.  But things were improved today for as far as the eye surgeons concern.  I guess there was a blood clot in the eye.  Family is reported that has been confused on and off for about a week family thinks is getting worse.  Sleeping all the time.  Family also reports an unsteady gait.  Past medical history sniffing for diabetes hyperlipidemia hypertension.  Patient is also a renal pancreas transplant patient.  They are preparing to restart him on dialysis.  His nephrologist is working on reestablishing access.      Past Medical History:  Diagnosis Date   Anemia of chronic renal failure, unspecified CKD stage    Cancer (HCC)    basal cell ca lt lower leg   Diabetes mellitus without complication ([place])    Hx of kidney transplant 2005   Hyperlipidemia    Hypertension    Iron deficiency    Renal disorder 2005   kidney transplant at [facility]    Patient Active Problem List   Diagnosis Date Noted   Anemia of chronic renal failure, unspecified CKD stage 12/15/2020   Iron deficiency 12/15/2020   DM2 (diabetes mellitus, type 2) ([place]) 08/24/2017   Diarrhea 08/24/2017   AKI (acute kidney injury) ([place]) 08/24/2017   Renal transplant recipient 08/24/2017   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/2017   HTN (hypertension) 08/24/2017   Ischemia of hand 02/01/2013    Past Surgical History:  Procedure Laterality Date   APPLICATION OF WOUND VAC Left 04/20/2017   Procedure: APPLICATION OF WOUND VAC LEFT LOWER EXTREMITY;  Surgeon: Wallace Going, DO;  Location: [place];  Service: Plastics;   Laterality: Left;   AV FISTULA PLACEMENT     COMBINED KIDNEY-PANCREAS TRANSPLANT     EMBOLECTOMY Left 01/15/2013   Procedure: EMBOLECTOMY Left Brachial Artery With Patch Angioplasty;  Surgeon: Elam Dutch, MD;  Location: [place];  Service: Vascular;  Laterality: Left;   EYE SURGERY     HERNIA REPAIR     KIDNEY TRANSPLANT     NEPHRECTOMY TRANSPLANTED ORGAN     SKIN SPLIT GRAFT Left 05/18/2017   Procedure: SKIN GRAFT SPLIT THICKNESS FROM LEFT UPPER LEG TO LEFT LOWER LEG WOUND;  Surgeon: Wallace Going, DO;  Location: [place];  Service: Plastics;  Laterality: Left;   WOUND DEBRIDEMENT  05/18/2017   Procedure: DEBRIDEMENT LEFT LOWER LEG WOUND;  Surgeon: Wallace Going, DO;  Location: [place];  Service: Plastics;;       Family History  Problem Relation Age of Onset   Diabetes Mother    Hypertension Mother    Hypertension Father     Social History   Tobacco Use   Smoking status: Never   Smokeless tobacco: Never  Vaping Use   Vaping Use: Never used  Substance Use Topics   Alcohol use: No   Drug use: No    Home Medications Prior to Admission medications   Medication Sig Start Date End Date Taking? Authorizing Provider  acetaZOLAMIDE ER (DIAMOX) 500 MG capsule Take 500 mg  by mouth 2 (two) times daily. 15 day supply 01/27/21   [provider]  amitriptyline (ELAVIL) 25 MG tablet Take 25 mg by mouth at bedtime.    [provider]  amLODipine (NORVASC) 10 MG tablet Take 10 mg by mouth at bedtime. 08/07/17   [provider]  aspirin 81 MG tablet Take 81 mg by mouth daily.    [provider]  atorvastatin (LIPITOR) 10 MG tablet Take 10 mg by mouth daily at 6 PM.     [provider]  brimonidine (ALPHAGAN) 0.2 % ophthalmic solution 1 drop 3 (three) times daily. 01/26/21   [provider]  calcitRIOL (ROCALTROL) 0.25 MCG capsule Take 0.25 mcg by mouth daily.    [provider]   furosemide (LASIX) 20 MG tablet Take 60 mg by mouth 2 (two) times daily.     [provider]  furosemide (LASIX) 40 MG tablet Take 40 mg by mouth 2 (two) times daily. 01/11/21   [provider]  gabapentin (NEURONTIN) 600 MG tablet Take 300 mg by mouth daily.    [provider]  glipiZIDE (GLUCOTROL) 10 MG tablet Take 10 mg by mouth daily. 12/13/20   [provider]  hydrALAZINE (APRESOLINE) 25 MG tablet Take 25 mg by mouth 2 (two) times daily. 07/26/17   [provider]  insulin glargine (LANTUS) 100 UNIT/ML injection Inject 0.05 mLs (5 Units total) into the skin at bedtime. 08/26/17   Hongalgi, Lenis Dickinson, MD  labetalol (NORMODYNE) 200 MG tablet Take 200-400 mg by mouth See admin instructions. Take 1 tablet in the morning and take 2 tablets in the evening     [provider]  latanoprost (XALATAN) 0.005 % ophthalmic solution Place 1 drop into the left eye at bedtime. 01/26/21   [provider]  mycophenolate (CELLCEPT) 250 MG capsule Take 250 mg by mouth 2 (two) times daily. 01/21/21   [provider]  mycophenolate (CELLCEPT) 500 MG tablet Take 500 mg by mouth 3 (three) times daily.     [provider]  pioglitazone (ACTOS) 45 MG tablet Take 45 mg by mouth daily. 01/02/21   [provider]  sorbitol 70 % solution Take 15 mLs by mouth daily as needed (constipation).     [provider]  tacrolimus (PROGRAF) 1 MG capsule Take 2 mg by mouth 2 (two) times daily.    [provider]    Allergies    Patient has no known allergies.  Review of Systems   Review of Systems  Constitutional:  Negative for chills and fever.  HENT:  Negative for ear pain and sore throat.   Eyes:  Positive for visual disturbance. Negative for pain.  Respiratory:  Negative for cough and shortness of breath.   Cardiovascular:  Negative for chest pain and palpitations.  Gastrointestinal:  Negative for abdominal pain and  vomiting.  Genitourinary:  Negative for dysuria and hematuria.  Musculoskeletal:  Negative for arthralgias and back pain.  Skin:  Negative for color change and rash.  Neurological:  Positive for speech difficulty. Negative for seizures, syncope and headaches.  Psychiatric/Behavioral:  Positive for confusion.   All other systems reviewed and are negative.  Physical Exam Updated Vital Signs BP (!) 191/91   Pulse 64   Temp (!) 97.5 F (36.4 C)   Resp 13   SpO2 100%   Physical Exam Vitals and nursing note reviewed.  Constitutional:      General: He is not in acute distress.  Appearance: Normal appearance. He is well-developed.  HENT:     Head: Normocephalic and atraumatic.  Eyes:     Extraocular Movements: Extraocular movements intact.     Conjunctiva/sclera: Conjunctivae normal.     Pupils: Pupils are equal, round, and reactive to light.  Cardiovascular:     Rate and Rhythm: Normal rate and regular rhythm.     Heart sounds: No murmur heard. Pulmonary:     Effort: Pulmonary effort is normal. No respiratory distress.     Breath sounds: Normal breath sounds.  Abdominal:     Palpations: Abdomen is soft.     Tenderness: There is no abdominal tenderness.  Musculoskeletal:        General: No swelling. Normal range of motion.     Cervical back: Normal range of motion and neck supple. No rigidity.  Skin:    General: Skin is warm and dry.  Neurological:     General: No focal deficit present.     Mental Status: He is alert and oriented to person, place, and time.     Cranial Nerves: No cranial nerve deficit.     Sensory: No sensory deficit.     Motor: No weakness.     Coordination: Coordination normal.    ED Results / Procedures / Treatments   Labs (all labs ordered are listed, but only abnormal results are displayed) Labs Reviewed  CBC WITH DIFFERENTIAL/PLATELET - Abnormal; Notable for the following components:      Result Value   RBC 3.04 (*)    Hemoglobin 9.2 (*)     HCT 30.4 (*)    Platelets 148 (*)    Lymphs Abs 0.3 (*)    All other components within normal limits  COMPREHENSIVE METABOLIC PANEL - Abnormal; Notable for the following components:   Chloride 112 (*)    CO2 15 (*)    Glucose, Bld 200 (*)    BUN 60 (*)    Creatinine, Ser 6.78 (*)    Calcium 7.9 (*)    Total Protein 5.5 (*)    Albumin 3.4 (*)    AST 14 (*)    GFR, Estimated 9 (*)    All other components within normal limits  URINALYSIS, ROUTINE W REFLEX MICROSCOPIC - Abnormal; Notable for the following components:   Glucose, UA 50 (*)    Hgb urine dipstick SMALL (*)    Protein, ur 100 (*)    All other components within normal limits  CBC - Abnormal; Notable for the following components:   RBC 3.26 (*)    Hemoglobin 9.9 (*)    HCT 32.3 (*)    All other components within normal limits  DIFFERENTIAL - Abnormal; Notable for the following components:   Lymphs Abs 0.3 (*)    All other components within normal limits  I-STAT CHEM 8, ED - Abnormal; Notable for the following components:   Chloride 113 (*)    BUN 55 (*)    Creatinine, Ser 7.50 (*)    Glucose, Bld 116 (*)    Calcium, Ion 1.12 (*)    TCO2 16 (*)    Hemoglobin 10.9 (*)    HCT 32.0 (*)    All other components within normal limits  LIPASE, BLOOD  AMMONIA  ETHANOL  PROTIME-INR  APTT  RAPID URINE DRUG SCREEN, HOSP PERFORMED    EKG EKG Interpretation  Date/Time:  Monday February 02 2021 16:57:05 EST Ventricular Rate:  60 PR Interval:  200 QRS Duration: 91 QT Interval:  447  QTC Calculation: 447 R Axis:   59 Text Interpretation: Sinus rhythm Borderline T abnormalities, inferior leads Confirmed by Fredia Sorrow [phone]) on 02/02/2021 5:03:49 PM  Radiology CT HEAD WO CONTRAST  Result Date: 02/02/2021 CLINICAL DATA:  Confusion EXAM: CT HEAD WITHOUT CONTRAST TECHNIQUE: Contiguous axial images were obtained from the base of the skull through the vertex without intravenous contrast. COMPARISON:  None. FINDINGS:  Brain: No acute territorial infarction, hemorrhage or intracranial mass. The ventricles are nonenlarged. Age indeterminate lacunar infarct versus prominent perivascular space in the left basal ganglia Vascular: No hyperdense vessels.  Carotid vascular calcification Skull: Normal. Negative for fracture or focal lesion. Sinuses/Orbits: Mucosal disease in the sinuses. Postsurgical changes the left globe. Other: None IMPRESSION: 1. Small hypodensity in left basal ganglia may reflect age indeterminate lacunar infarct versus prominent perivascular space 2. Otherwise negative non contrasted CT appearance of the brain for age Electronically Signed   By: Donavan Foil M.D.   On: 02/02/2021 17:37    Procedures Procedures   Medications Ordered in ED Medications - No data to display  ED Course  I have reviewed the triage vital signs and the nursing notes.  Pertinent labs & imaging results that were available during my care of the patient were reviewed by me and considered in my medical decision making (see chart for details).    MDM Rules/Calculators/A&P                          CT head and MRI brain without evidence of any acute stroke.  Call level was normal.  INR was normal.  No leukocytosis.  Hemoglobin a little low at 9.9.  Urine drug screen negative.  Urinalysis not consistent with urinary tract infection.  Comprehensive metabolic panel CO2 15 glucose 200 BUN 60 creatinine 6.78.  For a GFR at 9.  Creatinine is significantly worse compared to where it was.  Normally his creatinine was 2 something.  Patient will need to follow-up closely with his nephrologist.  Potassium is normal at 4.3.  Ammonia and lipase normal.  Chest x-ray not done but lungs are clear bilaterally   Final Clinical Impression(s) / ED Diagnoses Final diagnoses:  Altered mental status, unspecified altered mental status type    Rx / DC Orders ED Discharge Orders     None        Fredia Sorrow, MD 02/03/21 0005

## 2021-02-05 ENCOUNTER — Encounter (HOSPITAL_COMMUNITY)
Admission: RE | Admit: 2021-02-05 | Discharge: 2021-02-05 | Disposition: A | Discharge status: Home / Self Care | Payer: BC Managed Care – PPO | Source: Ambulatory Visit | Attending: Nephrology | Admitting: Nephrology

## 2021-02-05 ENCOUNTER — Other Ambulatory Visit: Payer: Self-pay

## 2021-02-05 VITALS — BP 172/81 | HR 70

## 2021-02-05 DIAGNOSIS — D631 Anemia in chronic kidney disease: Secondary | ICD-10-CM | POA: Insufficient documentation

## 2021-02-05 DIAGNOSIS — N189 Chronic kidney disease, unspecified: Secondary | ICD-10-CM | POA: Insufficient documentation

## 2021-02-05 LAB — POCT HEMOGLOBIN-HEMACUE: Hemoglobin: 9.8 g/dL — ABNORMAL LOW (ref 13.0–17.0)

## 2021-02-05 LAB — IRON AND TIBC
Iron: 83 ug/dL (ref 45–182)
Saturation Ratios: 35 % (ref 17.9–39.5)
TIBC: 238 ug/dL — ABNORMAL LOW (ref 250–450)
UIBC: 155 ug/dL

## 2021-02-05 LAB — FERRITIN: Ferritin: 113 ng/mL (ref 24–336)

## 2021-02-05 MED ORDER — EPOETIN ALFA-EPBX 10000 UNIT/ML IJ SOLN
INTRAMUSCULAR | Status: AC
  Filled 2021-02-05: qty 2

## 2021-02-05 MED ORDER — EPOETIN ALFA-EPBX 10000 UNIT/ML IJ SOLN
20000.0000 [IU] | INTRAMUSCULAR | Status: DC
  Administered 2021-02-05: 20000 [IU] via SUBCUTANEOUS

## 2021-02-09 ENCOUNTER — Encounter (HOSPITAL_COMMUNITY): Payer: Self-pay

## 2021-02-10 ENCOUNTER — Other Ambulatory Visit: Payer: Self-pay

## 2021-02-10 DIAGNOSIS — D631 Anemia in chronic kidney disease: Secondary | ICD-10-CM

## 2021-02-10 DIAGNOSIS — N179 Acute kidney failure, unspecified: Secondary | ICD-10-CM

## 2021-02-12 ENCOUNTER — Other Ambulatory Visit: Payer: Self-pay | Admitting: *Deleted

## 2021-02-12 ENCOUNTER — Encounter: Payer: Self-pay | Admitting: *Deleted

## 2021-02-16 NOTE — Progress Notes (Signed)
[facility]  PATIENT: Eric Mccarty DOB: Aug 01, 1955  REFERRING CLINICIAN: Corliss Parish, MD HISTORY FROM: self REASON FOR VISIT: confusion   HISTORICAL  CHIEF COMPLAINT:  Chief Complaint  Patient presents with   New Patient (Initial Visit)    Rm 1, confusion. Dr Amalia Hailey eye MD.  Hanley Seamen medication, some oral med (to decrease pressure in L eye) diamox.      HISTORY OF PRESENT ILLNESS:  The patient presents for evaluation of confusion which began in early November. Around that time he was prescribed diamox for eye pressure. After a couple of days on the medication he developed slurred speech, confusion, and gait imbalance. Was sleeping all the time. He presented to the ED 02/02/21 for his confusion and gait imbalance. CTH and MRI brain were normal. CMP showed a Cr of 6.78 and BUN on 60.   He stopped the diamox and his confusion have improved "99.9%".  He does have a history of mild memory issues prior to this episode of confusion. Will occasionally forget the names of people he hasn't seen in a while. Has some mild word finding difficulty and will occasionally mix up words. Kept this appointment today because there is a family history of Alzheimer's which concerns him.  TBI:  No past history of TBI Stroke:  no past history of stroke Seizures:  no past history of seizures Sleep: Sleeps most of the day. Snores a lot at night. Has never had sleep study.   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 Remembers his pills if he wakes up early enough in the morning. Ever left the stove on by accident?: no Forgetting loved ones names?: occasionally forgets friend's names Word finding difficulty? yes  OTHER MEDICAL CONDITIONS: Renal failure s/p renal transplant (planning on starting dialysis), HLD, HTN   REVIEW OF SYSTEMS: Full 14 system review of systems performed and negative with exception of: memory loss  ALLERGIES: No Known Allergies  HOME MEDICATIONS: Outpatient Medications Prior to Visit  Medication Sig Dispense Refill   acetaZOLAMIDE ER (DIAMOX) 500 MG capsule Take 500 mg by mouth 2 (two) times daily. 15 day supply     amitriptyline (ELAVIL) 25 MG tablet Take 25 mg by mouth at bedtime.     amLODipine (NORVASC) 10 MG tablet Take 10 mg by mouth at bedtime.  6   aspirin 81 MG tablet Take 81 mg by mouth daily.     atorvastatin (LIPITOR) 10 MG tablet Take 10 mg by mouth daily at 6 PM.      brimonidine (ALPHAGAN) 0.2 % ophthalmic solution 1 drop 3 (three) times daily.     calcitRIOL (ROCALTROL) 0.5 MCG capsule Take by mouth daily.     furosemide (LASIX) 40 MG tablet Take 80 mg by mouth 2 (two) times daily.     gabapentin (NEURONTIN) 600 MG tablet Take 300 mg by mouth daily.     glipiZIDE (GLUCOTROL) 10 MG tablet Take 10 mg by mouth daily.  hydrALAZINE (APRESOLINE) 25 MG tablet Take 25 mg by mouth 2 (two) times daily.  6   insulin glargine (LANTUS) 100 UNIT/ML injection Inject 0.05 mLs (5 Units total) into the skin at bedtime.     labetalol (NORMODYNE) 200 MG tablet Take 200-400 mg by mouth See admin instructions. Take 1 tablet in the morning and take 2 tablets in the evening      latanoprost (XALATAN) 0.005 % ophthalmic solution Place 1 drop into the left eye at bedtime.     mycophenolate (CELLCEPT) 250 MG capsule Take 250 mg by mouth 2 (two) times daily.     pioglitazone (ACTOS) 45 MG tablet Take 45 mg by mouth daily.      prednisoLONE acetate (PRED FORTE) 1 % ophthalmic suspension Place 1 drop into the right eye 4 (four) times daily.     sorbitol 70 % solution Take 15 mLs by mouth daily as needed (constipation).      tacrolimus (PROGRAF) 1 MG capsule Take 2 mg by mouth 2 (two) times daily.     furosemide (LASIX) 20 MG tablet Take 60 mg by mouth 2 (two) times daily.      mycophenolate (CELLCEPT) 500 MG tablet Take 500 mg by mouth 3 (three) times daily.  (Patient not taking: Reported on 02/17/2021)     No facility-administered medications prior to visit.    PAST MEDICAL HISTORY: Past Medical History:  Diagnosis Date   Anemia of chronic renal failure, unspecified CKD stage    Cancer ([place])    basal cell ca lt lower leg   Diabetes mellitus without complication ([place])    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   Hyperlipidemia    Hyperparathyroidism ([place])    Hypertension    Iron deficiency    Renal disorder 2005   kidney transplant at [place]: Past Surgical History:  Procedure Laterality Date   APPLICATION OF WOUND VAC Left 04/20/2017   Procedure: APPLICATION OF WOUND VAC LEFT LOWER EXTREMITY;  Surgeon: Wallace Going, DO;  Location: [place];  Service: Plastics;  Laterality: Left;   AV FISTULA PLACEMENT     COMBINED KIDNEY-PANCREAS TRANSPLANT     EMBOLECTOMY Left 01/15/2013   Procedure: EMBOLECTOMY Left Brachial Artery With Patch Angioplasty;  Surgeon: Elam Dutch, MD;  Location: [place];  Service: Vascular;  Laterality: Left;   EYE SURGERY     HERNIA REPAIR     KIDNEY TRANSPLANT     NEPHRECTOMY TRANSPLANTED ORGAN     SKIN SPLIT GRAFT Left 05/18/2017   Procedure: SKIN GRAFT SPLIT THICKNESS FROM LEFT UPPER LEG TO LEFT LOWER LEG WOUND;  Surgeon: Wallace Going, DO;  Location: [place];  Service: Plastics;  Laterality: Left;   WOUND DEBRIDEMENT  05/18/2017   Procedure: DEBRIDEMENT LEFT LOWER LEG WOUND;  Surgeon: Wallace Going, DO;  Location: [place];  Service: Plastics;;    FAMILY HISTORY: Family History  Problem Relation Age of Onset   Diabetes Mother    Hypertension Mother    Hypertension Father    Alzheimer's disease Father    Kidney disease Father     SOCIAL HISTORY: Social History   Socioeconomic History   Marital status: Married    Spouse name: Not on file   Number of children: 2   Years of education: Not on file   Highest education level: Not on file  Occupational History   Not on file  Tobacco Use   Smoking status: Never   Smokeless tobacco: Never  Vaping Use   Vaping Use: Never used  Substance and Sexual Activity   Alcohol use: No   Drug use: No   Sexual activity: Not on file  Other Topics Concern   Not on file  Social History Narrative   Lives with wife   Social Determinants of Health   Financial Resource Strain: Not on file  Food Insecurity: Not on file  Transportation Needs: Not on file  Physical Activity: Not on file  Stress: Not on file  Social Connections: Not on file  Intimate Partner Violence: Not on file     PHYSICAL EXAM  GENERAL EXAM/CONSTITUTIONAL: Vitals:  Vitals:   02/17/21 1536  BP: (!) 214/72  Pulse: 60  Weight: 158 lb (71.7 kg)  Height: 5' 6.75" (1.695 m)   Body mass index is 24.93 kg/m. Wt Readings from Last 3 Encounters:  02/17/21 158 lb (71.7 kg)  11/14/19 159 lb (72.1 kg)  11/14/18 150 lb 12.8 oz (68.4 kg)   Patient is in no distress; well developed, nourished and groomed; neck is supple  EYES: Pupils round and reactive to light, Visual fields full to confrontation, Extraocular movements intacts,   MUSCULOSKELETAL: Gait, strength, tone, movements noted in Neurologic exam below  NEUROLOGIC: MENTAL STATUS:  MMSE - [place] Exam 02/17/2021  Orientation to time 5  Orientation to Place 5  Registration 3  Attention/ Calculation 4  Recall 2  Language- name 2 objects 2  Language- repeat 1   Language- follow 3 step command 3  Language- read & follow direction 1  Write a sentence 1  Copy design 1  Total score 28    CRANIAL NERVE:  2nd, 3rd, 4th, 6th - pupils equal and reactive to light, visual fields full to confrontation, extraocular muscles intact, no nystagmus 5th - facial sensation symmetric 7th - facial strength symmetric 8th - hearing intact 9th - palate elevates symmetrically, uvula midline 11th - shoulder shrug symmetric 12th - tongue protrusion midline  MOTOR:  normal bulk and tone, no cogwheeling, full strength in the BUE, BLE  SENSORY:  normal and symmetric to light touch all 4 extremities  COORDINATION:  finger-nose-finger, fine finger movements normal, no tremor  REFLEXES:  deep tendon reflexes present and symmetric  GAIT/STATION:  Normal step length, normal arm swing     DIAGNOSTIC DATA (LABS, IMAGING, TESTING) - I reviewed patient records, labs, notes, testing and imaging myself where available.  Lab Results  Component Value Date   WBC 5.6 02/02/2021   HGB 9.8 (L) 02/05/2021   HCT 32.0 (L) 02/02/2021   MCV 99.1 02/02/2021   PLT 155 02/02/2021      Component Value Date/Time   NA 141 02/02/2021 1810   K 3.8 02/02/2021 1810   CL 113 (H) 02/02/2021 1810   CO2 15 (L) 02/02/2021 1240   GLUCOSE 116 (H) 02/02/2021 1810   BUN 55 (H) 02/02/2021 1810   CREATININE 7.50 (H) 02/02/2021 1810   CALCIUM 7.9 (L) 02/02/2021 1240   PROT 5.5 (L) 02/02/2021 1240   ALBUMIN 3.4 (L) 02/02/2021 1240   AST 14 (L) 02/02/2021 1240   ALT 15 02/02/2021 1240   ALKPHOS 107 02/02/2021 1240   BILITOT 0.7 02/02/2021 1240   GFRNONAA 9 (L) 02/02/2021 1240   GFRAA 27 (L) 11/09/2018 1407   Lab Results  Component Value Date   CHOL  07/14/2009    115  ATP III CLASSIFICATION:  <200     mg/dL   Desirable  200-239  mg/dL   Borderline High  >=240    mg/dL   High          HDL 35 (L) 07/14/2009   LDLCALC  07/14/2009    56        Total Cholesterol/HDL:CHD  Risk Coronary Heart Disease Risk Table                     Men   Women  1/2 Average Risk   3.4   3.3  Average Risk       5.0   4.4  2 X Average Risk   9.6   7.1  3 X Average Risk  23.4   11.0        Use the calculated Patient Ratio above and the CHD Risk Table to determine the patient's CHD Risk.        ATP III CLASSIFICATION (LDL):  <100     mg/dL   Optimal  100-129  mg/dL   Near or Above                    Optimal  130-159  mg/dL   Borderline  160-189  mg/dL   High  >190     mg/dL   Very High   TRIG 119 07/14/2009   CHOLHDL 3.3 07/14/2009   Lab Results  Component Value Date   HGBA1C 7.3 (H) 08/25/2017   No results found for: VITAMINB12 No results found for: TSH   ASSESSMENT AND PLAN  65 y.o. year old male with a history of Renal failure s/p renal transplant (planning on starting dialysis), HLD, HTN who presents for evaluation of confusion and memory loss. Suspect his acute episode of confusion was secondary to Diamox use as he is almost completely back to his baseline since stopping the medication. MMSE score today is normal. Discussed diagnosis of mild cognitive impairment and lifestyle changes to improve brain health. Will check B12 and TSH levels today. Offered referral to Sleep for OSA evaluation as he does snore and endorses daytime sleepiness, however he declined at this time.   1. Memory loss       PLAN: - Labs: TSH, B12 - next steps: consider sleep study to evaluate for OSA  Orders Placed This Encounter  Procedures   Vitamin B12   TSH     No orders of the defined types were placed in this encounter.   Return in about 1 year (around 02/17/2022).  I spent an average of 46 minutes chart reviewing and counseling the patient, with at least 50% of the time face to face with the patient. General brain health measures discussed, including the importance of regular aerobic and cognitive exercise. Reviewed safety measures.  Genia Harold, MD 02/17/21 4:36  PM  [facility] [address], [place], [place] [address] [phone]

## 2021-02-17 ENCOUNTER — Encounter: Payer: Self-pay | Admitting: Psychiatry

## 2021-02-17 ENCOUNTER — Ambulatory Visit: Payer: BC Managed Care – PPO | Admitting: Psychiatry

## 2021-02-17 VITALS — BP 214/72 | HR 60 | Ht 66.75 in | Wt 158.0 lb

## 2021-02-17 DIAGNOSIS — R413 Other amnesia: Secondary | ICD-10-CM | POA: Diagnosis not present

## 2021-02-17 NOTE — Patient Instructions (Signed)
Let me know if you would like to have a sleep study done to assess for sleep apnea Blood work  A person with mild cognitive impairment (MCI) experiences memory problems greater than normally expected with aging, but not serious enough to interfere with daily activities.  The patient with MCI complains of difficulty with memory. Typically, the complaints include trouble remembering the names of people they met recently, trouble remembering the flow of a conversation, and an increased tendency to misplace things, or similar problems. In many cases, the individual will be aware of these difficulties and will compensate with increased reliance on notes and calendars.   Although there is an increased chances of going on to develop dementia, it is not possible currently to predict with certainty which patients with MCI will or will not go on to develop dementia. There is currently no specific treatment for MCI. People leading sedentary lifestyles are at greater risk for developing dementia. Increased physical activity and brain exercise can help with maintaining brain function.  Tasks to improve attention/working memory 1. Good sleep hygiene (7-8 hrs of sleep) 2. Learning a new skill (Painting, Carpentry, Pottery, new language, Knitting). 3.Cognitive exercises (keep a daily journal, Puzzles) 4. Physical exercise and training  (30 min/day X 4 days week) 5. Being on Antidepressant if needed 6.Yoga, Meditation, Tai Chi 7. Decrease alcohol intake 8.Have a clear schedule and structure in daily routine

## 2021-02-18 LAB — VITAMIN B12: Vitamin B-12: 119 pg/mL — ABNORMAL LOW (ref 232–1245)

## 2021-02-18 LAB — TSH: TSH: 3.33 u[IU]/mL (ref 0.450–4.500)

## 2021-02-20 ENCOUNTER — Encounter (HOSPITAL_COMMUNITY)
Admission: RE | Admit: 2021-02-20 | Discharge: 2021-02-20 | Disposition: A | Discharge status: Home / Self Care | Payer: BC Managed Care – PPO | Source: Ambulatory Visit | Attending: Nephrology | Admitting: Nephrology

## 2021-02-20 VITALS — BP 180/54 | HR 67 | Temp 97.6°F

## 2021-02-20 DIAGNOSIS — D631 Anemia in chronic kidney disease: Secondary | ICD-10-CM

## 2021-02-20 DIAGNOSIS — N189 Chronic kidney disease, unspecified: Secondary | ICD-10-CM | POA: Diagnosis not present

## 2021-02-20 LAB — POCT HEMOGLOBIN-HEMACUE: Hemoglobin: 10.7 g/dL — ABNORMAL LOW (ref 13.0–17.0)

## 2021-02-20 MED ORDER — EPOETIN ALFA-EPBX 10000 UNIT/ML IJ SOLN
INTRAMUSCULAR | Status: AC
  Filled 2021-02-20: qty 2

## 2021-02-20 MED ORDER — EPOETIN ALFA-EPBX 10000 UNIT/ML IJ SOLN
20000.0000 [IU] | INTRAMUSCULAR | Status: DC
  Administered 2021-02-20: 20000 [IU] via SUBCUTANEOUS

## 2021-03-06 ENCOUNTER — Encounter (HOSPITAL_COMMUNITY): Payer: BC Managed Care – PPO

## 2021-03-09 ENCOUNTER — Encounter: Payer: Self-pay | Admitting: Vascular Surgery

## 2021-03-09 ENCOUNTER — Other Ambulatory Visit: Payer: Self-pay

## 2021-03-09 ENCOUNTER — Encounter (HOSPITAL_COMMUNITY): Payer: Self-pay

## 2021-03-09 ENCOUNTER — Ambulatory Visit (INDEPENDENT_AMBULATORY_CARE_PROVIDER_SITE_OTHER)
Admission: RE | Admit: 2021-03-09 | Discharge: 2021-03-09 | Disposition: A | Discharge status: Home / Self Care | Payer: BC Managed Care – PPO | Source: Ambulatory Visit | Attending: Vascular Surgery | Admitting: Vascular Surgery

## 2021-03-09 ENCOUNTER — Ambulatory Visit (HOSPITAL_COMMUNITY)
Admission: RE | Admit: 2021-03-09 | Discharge: 2021-03-09 | Disposition: A | Discharge status: Home / Self Care | Payer: BC Managed Care – PPO | Source: Ambulatory Visit | Attending: Vascular Surgery | Admitting: Vascular Surgery

## 2021-03-09 ENCOUNTER — Ambulatory Visit (INDEPENDENT_AMBULATORY_CARE_PROVIDER_SITE_OTHER): Payer: BC Managed Care – PPO | Admitting: Vascular Surgery

## 2021-03-09 VITALS — BP 183/74 | HR 58 | Temp 98.3°F | Resp 18 | Ht 66.0 in | Wt 159.0 lb

## 2021-03-09 DIAGNOSIS — N185 Chronic kidney disease, stage 5: Secondary | ICD-10-CM | POA: Diagnosis not present

## 2021-03-09 DIAGNOSIS — N179 Acute kidney failure, unspecified: Secondary | ICD-10-CM | POA: Diagnosis not present

## 2021-03-09 NOTE — Progress Notes (Signed)
Office Note     CC: Stage V chronic kidney disease Requesting Provider:  Corliss Parish, MD  HPI: Eric Mccarty is a 65 y.o. (06-03-1955) male presenting at the request of .Corliss Parish, MD for new permanent HD access.  Patient has history of left-sided radiocephalic fistula, forearm loop graft, right sided tunneled HD line. Currently without access, not undergoing dialysis.   Manu has long history of kidney disease and diabetes, and underwent kidney, pancreas DCD transplant in 2002.  Both kidney and pancreas have been functioning well until recently.  Over the last several months, has had a significant decrease in his GFR.  His pancreas continues to function however he does have to supplement insulin.   The pt is  on a statin for cholesterol management.  The pt is  on a daily aspirin.   Other AC:  - The pt is  on medication for hypertension.   The pt is  diabetic.  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   Hyperlipidemia    Hyperparathyroidism ([place])    Hypertension    Iron deficiency    Renal disorder 2005   kidney transplant at [facility]    Past Surgical History:  Procedure Laterality Date   APPLICATION OF WOUND VAC Left 04/20/2017   Procedure: APPLICATION OF WOUND VAC LEFT LOWER EXTREMITY;  Surgeon: Wallace Going, DO;  Location: [place];  Service: Plastics;  Laterality: Left;   AV FISTULA PLACEMENT     COMBINED KIDNEY-PANCREAS TRANSPLANT     EMBOLECTOMY Left 01/15/2013   Procedure: EMBOLECTOMY Left Brachial Artery With Patch Angioplasty;  Surgeon: Elam Dutch, MD;  Location: [place];  Service: Vascular;  Laterality: Left;   EYE SURGERY     HERNIA REPAIR     KIDNEY TRANSPLANT     NEPHRECTOMY TRANSPLANTED ORGAN      SKIN SPLIT GRAFT Left 05/18/2017   Procedure: SKIN GRAFT SPLIT THICKNESS FROM LEFT UPPER LEG TO LEFT LOWER LEG WOUND;  Surgeon: Wallace Going, DO;  Location: [place];  Service: Plastics;  Laterality: Left;   WOUND DEBRIDEMENT  05/18/2017   Procedure: DEBRIDEMENT LEFT LOWER LEG WOUND;  Surgeon: Wallace Going, DO;  Location: [place];  Service: Plastics;;    Social History   Socioeconomic History   Marital status: Married    Spouse name: Not on file   Number of children: 2   Years of education: Not on file   Highest education level: Not on file  Occupational History   Not on file  Tobacco Use   Smoking status: Never   Smokeless tobacco: Never  Vaping Use   Vaping Use: Never used  Substance and Sexual Activity   Alcohol use: No   Drug use: No   Sexual activity: Not on file  Other Topics Concern   Not on file  Social History Narrative   Lives with wife   Social Determinants of Health   Financial Resource Strain: Not on file  Food Insecurity: Not on file  Transportation Needs: Not on file  Physical Activity: Not on file  Stress: Not on file  Social Connections: Not on file  Intimate Partner Violence: Not on file  Family History  Problem Relation Age of Onset   Diabetes Mother    Hypertension Mother    Hypertension Father    Alzheimer's disease Father    Kidney disease Father     Current Outpatient Medications  Medication Sig Dispense Refill   amitriptyline (ELAVIL) 25 MG tablet Take 25 mg by mouth at bedtime.     amLODipine (NORVASC) 10 MG tablet Take 10 mg by mouth at bedtime.  6   aspirin 81 MG tablet Take 81 mg by mouth daily.     atorvastatin (LIPITOR) 10 MG tablet Take 10 mg by mouth daily at 6 PM.      calcitRIOL (ROCALTROL) 0.5 MCG capsule Take by mouth daily.     furosemide (LASIX) 40 MG tablet Take 80 mg by mouth 2 (two) times daily.     gabapentin (NEURONTIN) 600 MG tablet Take 300 mg by mouth daily.      glipiZIDE (GLUCOTROL) 10 MG tablet Take 10 mg by mouth daily.     hydrALAZINE (APRESOLINE) 25 MG tablet Take 25 mg by mouth 2 (two) times daily.  6   insulin glargine (LANTUS) 100 UNIT/ML injection Inject 0.05 mLs (5 Units total) into the skin at bedtime.     labetalol (NORMODYNE) 200 MG tablet Take 200-400 mg by mouth See admin instructions. Take 1 tablet in the morning and take 2 tablets in the evening      mycophenolate (CELLCEPT) 250 MG capsule Take 250 mg by mouth 2 (two) times daily.     pioglitazone (ACTOS) 45 MG tablet Take 45 mg by mouth daily.     prednisoLONE acetate (PRED FORTE) 1 % ophthalmic suspension Place 1 drop into the right eye 4 (four) times daily.     sorbitol 70 % solution Take 15 mLs by mouth daily as needed (constipation).      tacrolimus (PROGRAF) 1 MG capsule Take 2 mg by mouth 2 (two) times daily.     acetaZOLAMIDE ER (DIAMOX) 500 MG capsule Take 500 mg by mouth 2 (two) times daily. 15 day supply (Patient not taking: Reported on 03/09/2021)     brimonidine (ALPHAGAN) 0.2 % ophthalmic solution 1 drop 3 (three) times daily. (Patient not taking: Reported on 03/09/2021)     latanoprost (XALATAN) 0.005 % ophthalmic solution Place 1 drop into the left eye at bedtime. (Patient not taking: Reported on 03/09/2021)     mycophenolate (CELLCEPT) 500 MG tablet Take 500 mg by mouth 3 (three) times daily.  (Patient not taking: Reported on 02/17/2021)     No current facility-administered medications for this visit.    No Known Allergies   REVIEW OF SYSTEMS:   [X]  denotes positive finding, [ ]  denotes negative finding Cardiac  Comments:  Chest pain or chest pressure:    Shortness of breath upon exertion:    Short of breath when lying flat:    Irregular heart rhythm:        Vascular    Pain in calf, thigh, or hip brought on by ambulation:    Pain in feet at night that wakes you up from your sleep:     Blood clot in your veins:    Leg swelling:         Pulmonary     Oxygen at home:    Productive cough:     Wheezing:         Neurologic    Sudden weakness in arms or legs:     Sudden numbness in arms  or legs:     Sudden onset of difficulty speaking or slurred speech:    Temporary loss of vision in one eye:     Problems with dizziness:         Gastrointestinal    Blood in stool:     Vomited blood:         Genitourinary    Burning when urinating:     Blood in urine:        Psychiatric    Major depression:         Hematologic    Bleeding problems:    Problems with blood clotting too easily:        Skin    Rashes or ulcers:        Constitutional    Fever or chills:      PHYSICAL EXAMINATION:  Vitals:   03/09/21 1323  BP: (!) 183/74  Pulse: (!) 58  Resp: 18  Temp: 98.3 F (36.8 C)  TempSrc: Temporal  SpO2: 99%  Weight: 159 lb (72.1 kg)  Height: 5\' 6"  (1.676 m)    General:  WDWN in NAD; vital signs documented above Gait: Not observed HENT: WNL, normocephalic Pulmonary: normal non-labored breathing , without wheezing Cardiac: regular HR,  Abdomen: soft, NT, no masses Skin: without rashes Vascular Exam/Pulses:  Right Left  Radial 2+ (normal) 2+ (normal)  Ulnar 2+ (normal) 2+ (normal)                   Extremities: without ischemic changes, without Gangrene , without cellulitis; without open wounds;  Musculoskeletal: no muscle wasting or atrophy  Neurologic: A&O X 3;  No focal weakness or paresthesias are detected Psychiatric:  The pt has Normal affect.   Non-Invasive Vascular Imaging:   Noninvasive recent venous duplex ultrasonography demonstrated appropriate sized left basilic vein, right cephalic vein, right basilic vein Arterial duplex ultrasonography demonstrated adequate brachial artery size bilaterally.    ASSESSMENT/PLAN: Eric Mccarty is a 65 y.o. male presenting with chronic kidney disease stage V.  He is currently not on dialysis, with no tunneled lines. Eric Mccarty vein mapping demonstrated adequate  conduit for AV fistula creation at the left basilic, right cephalic, right basilic.  Would like to use the right cephalic for fistula creation, but I am worried about possible central stenosis as the patient has had a previous tunneled line in the right internal jugular vein years ago.  After discussing the risks and benefits of bilateral upper extremity venogram to assess for central stenosis, Eric Mccarty elected to proceed.  If there is no stenosis present on the right, I will create a right sided brachiocephalic fistula the following day.  Eric John, MD [facility] [phone]

## 2021-03-11 ENCOUNTER — Encounter (HOSPITAL_COMMUNITY): Payer: Self-pay | Admitting: Vascular Surgery

## 2021-03-11 ENCOUNTER — Other Ambulatory Visit: Payer: Self-pay

## 2021-03-11 ENCOUNTER — Encounter (HOSPITAL_COMMUNITY)
Admission: RE | Disposition: A | Discharge status: Home / Self Care | Payer: Self-pay | Source: Ambulatory Visit | Attending: Vascular Surgery

## 2021-03-11 ENCOUNTER — Ambulatory Visit (HOSPITAL_COMMUNITY)
Admission: RE | Admit: 2021-03-11 | Discharge: 2021-03-11 | Disposition: A | Discharge status: Home / Self Care | Payer: BC Managed Care – PPO | Source: Ambulatory Visit | Attending: Vascular Surgery | Admitting: Vascular Surgery

## 2021-03-11 DIAGNOSIS — E1136 Type 2 diabetes mellitus with diabetic cataract: Secondary | ICD-10-CM | POA: Diagnosis not present

## 2021-03-11 DIAGNOSIS — Z7982 Long term (current) use of aspirin: Secondary | ICD-10-CM | POA: Diagnosis not present

## 2021-03-11 DIAGNOSIS — E1122 Type 2 diabetes mellitus with diabetic chronic kidney disease: Secondary | ICD-10-CM | POA: Diagnosis not present

## 2021-03-11 DIAGNOSIS — N186 End stage renal disease: Secondary | ICD-10-CM | POA: Insufficient documentation

## 2021-03-11 DIAGNOSIS — I120 Hypertensive chronic kidney disease with stage 5 chronic kidney disease or end stage renal disease: Secondary | ICD-10-CM | POA: Insufficient documentation

## 2021-03-11 DIAGNOSIS — Z9483 Pancreas transplant status: Secondary | ICD-10-CM | POA: Insufficient documentation

## 2021-03-11 DIAGNOSIS — Z940 Kidney transplant status: Secondary | ICD-10-CM | POA: Insufficient documentation

## 2021-03-11 HISTORY — PX: UPPER EXTREMITY VENOGRAPHY: CATH118272

## 2021-03-11 LAB — POCT I-STAT, CHEM 8
BUN: 78 mg/dL — ABNORMAL HIGH (ref 8–23)
Calcium, Ion: 1.13 mmol/L — ABNORMAL LOW (ref 1.15–1.40)
Chloride: 112 mmol/L — ABNORMAL HIGH (ref 98–111)
Creatinine, Ser: 6.6 mg/dL — ABNORMAL HIGH (ref 0.61–1.24)
Glucose, Bld: 70 mg/dL (ref 70–99)
HCT: 31 % — ABNORMAL LOW (ref 39.0–52.0)
Hemoglobin: 10.5 g/dL — ABNORMAL LOW (ref 13.0–17.0)
Potassium: 4.5 mmol/L (ref 3.5–5.1)
Sodium: 140 mmol/L (ref 135–145)
TCO2: 18 mmol/L — ABNORMAL LOW (ref 22–32)

## 2021-03-11 LAB — GLUCOSE, CAPILLARY
Glucose-Capillary: 46 mg/dL — ABNORMAL LOW (ref 70–99)
Glucose-Capillary: 98 mg/dL (ref 70–99)

## 2021-03-11 SURGERY — UPPER EXTREMITY VENOGRAPHY
Anesthesia: LOCAL | Laterality: Bilateral

## 2021-03-11 MED ORDER — DEXTROSE 50 % IV SOLN
INTRAVENOUS | Status: AC
  Filled 2021-03-11: qty 50

## 2021-03-11 MED ORDER — IODIXANOL 320 MG/ML IV SOLN
INTRAVENOUS | Status: DC | PRN
  Administered 2021-03-11: 16:00:00 45 mL

## 2021-03-11 MED ORDER — HYDRALAZINE HCL 20 MG/ML IJ SOLN
20.0000 mg | Freq: Once | INTRAMUSCULAR | Status: AC
  Administered 2021-03-11: 17:00:00 20 mg via INTRAVENOUS
  Filled 2021-03-11: qty 1

## 2021-03-11 MED ORDER — SODIUM CHLORIDE 0.9 % IV SOLN: INTRAVENOUS | Status: DC

## 2021-03-11 MED ORDER — DEXTROSE 50 % IV SOLN
25.0000 mL | Freq: Once | INTRAVENOUS | Status: AC
  Administered 2021-03-11: 16:00:00 25 mL via INTRAVENOUS

## 2021-03-11 SURGICAL SUPPLY — 2 items
STOPCOCK MORSE 400PSI 3WAY (MISCELLANEOUS) ×2 IMPLANT
TUBING CIL FLEX 10 FLL-RA (TUBING) ×1 IMPLANT

## 2021-03-11 NOTE — H&P (Signed)
Office Note   Patient seen and examined in preop holding.  No complaints. No changes to medication history or physical exam since last seen in clinic. After discussing the risks and benefits of bilateral upper extremity venogram, Eric Mccarty elected to proceed.   Eric John MD   CC: Stage V chronic kidney disease Requesting Provider:  No ref. provider found  HPI: Eric Mccarty is a 65 y.o. (1956-03-03) male presenting at the request of .Corliss Parish, MD for new permanent HD access.  Patient has history of left-sided radiocephalic fistula, forearm loop graft, right sided tunneled HD line. Currently without access, not undergoing dialysis.   Jori has long history of kidney disease and diabetes, and underwent kidney, pancreas DCD transplant in 2002.  Both kidney and pancreas have been functioning well until recently.  Over the last several months, has had a significant decrease in his GFR.  His pancreas continues to function however he does have to supplement insulin.   The pt is  on a statin for cholesterol management.  The pt is  on a daily aspirin.   Other AC:  - The pt is  on medication for hypertension.   The pt is  diabetic.  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   Hyperlipidemia    Hyperparathyroidism ([place])    Hypertension    Iron deficiency    Legally blind    Renal disorder 2005   kidney transplant at [facility]    Past Surgical History:  Procedure Laterality Date   APPLICATION OF WOUND VAC Left 04/20/2017   Procedure: APPLICATION OF WOUND VAC LEFT LOWER EXTREMITY;  Surgeon: Wallace Going, DO;  Location: [place];  Service: Plastics;  Laterality: Left;   AV FISTULA PLACEMENT      COLONOSCOPY     COMBINED KIDNEY-PANCREAS TRANSPLANT     EMBOLECTOMY Left 01/15/2013   Procedure: EMBOLECTOMY Left Brachial Artery With Patch Angioplasty;  Surgeon: Elam Dutch, MD;  Location: [place];  Service: Vascular;  Laterality: Left;   EYE SURGERY     HERNIA REPAIR     KIDNEY TRANSPLANT     NEPHRECTOMY TRANSPLANTED ORGAN     SKIN SPLIT GRAFT Left 05/18/2017   Procedure: SKIN GRAFT SPLIT THICKNESS FROM LEFT UPPER LEG TO LEFT LOWER LEG WOUND;  Surgeon: Wallace Going, DO;  Location: [place];  Service: Plastics;  Laterality: Left;   WOUND DEBRIDEMENT  05/18/2017   Procedure: DEBRIDEMENT LEFT LOWER LEG WOUND;  Surgeon: Wallace Going, DO;  Location: [place];  Service: Plastics;;    Social History   Socioeconomic History   Marital status: Married    Spouse name: Not on file   Number of children: 2   Years of education: Not on file   Highest education level: Not on file  Occupational History   Not on file  Tobacco Use   Smoking status: Never   Smokeless tobacco: Never  Vaping Use   Vaping Use: Never used  Substance and Sexual Activity   Alcohol use: No   Drug use: No   Sexual activity: Not on file  Other Topics Concern   Not on  file  Social History Narrative   Lives with wife   Social Determinants of Health   Financial Resource Strain: Not on file  Food Insecurity: Not on file  Transportation Needs: Not on file  Physical Activity: Not on file  Stress: Not on file  Social Connections: Not on file  Intimate Partner Violence: Not on file    Family History  Problem Relation Age of Onset   Diabetes Mother    Hypertension Mother    Hypertension Father    Alzheimer's disease Father    Kidney disease Father     Current Facility-Administered Medications  Medication Dose Route Frequency Provider Last Rate Last Admin   0.9 %  sodium chloride infusion   Intravenous Continuous Eric John, MD 100 mL/hr at 03/11/21  1221 New Bag at 03/11/21 1221    No Known Allergies   REVIEW OF SYSTEMS:   [X]  denotes positive finding, [ ]  denotes negative finding Cardiac  Comments:  Chest pain or chest pressure:    Shortness of breath upon exertion:    Short of breath when lying flat:    Irregular heart rhythm:        Vascular    Pain in calf, thigh, or hip brought on by ambulation:    Pain in feet at night that wakes you up from your sleep:     Blood clot in your veins:    Leg swelling:         Pulmonary    Oxygen at home:    Productive cough:     Wheezing:         Neurologic    Sudden weakness in arms or legs:     Sudden numbness in arms or legs:     Sudden onset of difficulty speaking or slurred speech:    Temporary loss of vision in one eye:     Problems with dizziness:         Gastrointestinal    Blood in stool:     Vomited blood:         Genitourinary    Burning when urinating:     Blood in urine:        Psychiatric    Major depression:         Hematologic    Bleeding problems:    Problems with blood clotting too easily:        Skin    Rashes or ulcers:        Constitutional    Fever or chills:      PHYSICAL EXAMINATION:  Vitals:   03/11/21 1139  BP: (!) 151/64  Pulse: (!) 55  Resp: 18  Temp: 97.6 F (36.4 C)  TempSrc: Oral  SpO2: 100%  Weight: 72.1 kg  Height: 5\' 6"  (1.676 m)    General:  WDWN in NAD; vital signs documented above Gait: Not observed HENT: WNL, normocephalic Pulmonary: normal non-labored breathing , without wheezing Cardiac: regular HR,  Abdomen: soft, NT, no masses Skin: without rashes Vascular Exam/Pulses:  Right Left  Radial 2+ (normal) 2+ (normal)  Ulnar 2+ (normal) 2+ (normal)                   Extremities: without ischemic changes, without Gangrene , without cellulitis; without open wounds;  Musculoskeletal: no muscle wasting or atrophy  Neurologic: A&O X 3;  No focal weakness or paresthesias are detected Psychiatric:  The pt  has Normal affect.   Non-Invasive Vascular Imaging:   Noninvasive  recent venous duplex ultrasonography demonstrated appropriate sized left basilic vein, right cephalic vein, right basilic vein Arterial duplex ultrasonography demonstrated adequate brachial artery size bilaterally.    ASSESSMENT/PLAN: Eric Mccarty is a 65 y.o. male presenting with chronic kidney disease stage V.  He is currently not on dialysis, with no tunneled lines. Stevens vein mapping demonstrated adequate conduit for AV fistula creation at the left basilic, right cephalic, right basilic.  Would like to use the right cephalic for fistula creation, but I am worried about possible central stenosis as the patient has had a previous tunneled line in the right internal jugular vein years ago.  After discussing the risks and benefits of bilateral upper extremity venogram to assess for central stenosis, Eric Mccarty elected to proceed.  If there is no stenosis present on the right, I will create a right sided brachiocephalic fistula the following day.  Eric John, MD [facility] [phone]

## 2021-03-11 NOTE — Op Note (Signed)
° ° °  Patient name: Eric Mccarty MRN: [id number] DOB: 03-20-1956 Sex: male  03/11/2021 Pre-operative Diagnosis: Chronic kidney disease stage V Post-operative diagnosis:  Same Surgeon:  Broadus John, MD Procedure Performed: 1.  Bilateral upper extremity venogram  Indications:  LADARIOUS Eric Mccarty is a 65 y.o. male presenting with chronic kidney disease stage V.  He is currently not on dialysis, with no tunneled lines. Stevens vein mapping demonstrated adequate conduit for AV fistula creation at the left basilic, right cephalic, right basilic.  Would like to use the right cephalic for fistula creation, but I am worried about possible central stenosis as the patient has had a previous tunneled line in the right internal jugular vein years ago. After discussing the risks and benefits of bilateral upper extremity venogram to assess for central stenosis, Eric Mccarty elected to proceed.  Findings:  On the right:  Both cephalic and basilic veins patent Patent brachial veins Patent axillary vein patent subclavian vein Patent brachiocephalic vein  On the left: Cephalic vein is atretic, improving in caliber at the mid humerus Patent basilic vein Patent brachial veins Patent axillary vein patent subclavian vein Patent brachiocephalic vein   Procedure:   The patient was brought to the Cath Lab and laid in the supine position.  A timeout was performed.  The patient had 2 previously placed IVs, 1 in each arm.  From these IVs, diagnostic imaging of the right and left venous systems followed.  There was no intervention.  Plan: Right-sided brachiocephalic fistula   Cassandria Santee, MD [facility] [place] Office: [phone]

## 2021-03-11 NOTE — Progress Notes (Signed)
Notified Dr. Virl Cagey of BP 225/61- Per Dr. Virl Cagey patient should get hydralazine 20 mg IV and may be discharged when BP in 180s.

## 2021-03-11 NOTE — Progress Notes (Signed)
DUE TO COVID-19 ONLY ONE VISITOR IS ALLOWED TO COME WITH YOU AND STAY IN THE WAITING ROOM ONLY DURING PRE OP AND PROCEDURE DAY OF SURGERY.   PCP - Dr Corliss Parish Cardiologist - n/a Urology - Dr Tresa Endo   Chest x-ray - 10/06/20 CEn/a EKG - 02/02/21 Stress Test - n/a ECHO - 01/16/13 Cardiac Cath - n/a  ICD Pacemaker/Loop - none  Sleep Study -  n/a CPAP - none  Do not take Glipizide and Actos on the morning of surgery.  THE NIGHT BEFORE SURGERY, take 3 Units Lantus Insulin   Patient does not check blood sugar.      Anesthesia review: Yes  STOP now taking any Aleve, Naproxen, Ibuprofen, Motrin, Advil, Goody's, BC's, all herbal medications, fish oil, and all vitamins.   Coronavirus Screening Covid test n/a Ambulatory Surgery   Does patient have any of the following symptoms:  Cough yes/no: No Fever (>100.71F)  yes/no: No Runny nose yes/no: No Sore throat yes/no: No Difficulty breathing/shortness of breath  yes/no: No  Have you traveled in the last 14 days and where? yes/no: No  Wife Hoyle Sauer verbalized understanding of instructions that were given via phone.

## 2021-03-12 ENCOUNTER — Encounter (HOSPITAL_COMMUNITY)
Admission: RE | Disposition: A | Discharge status: Home / Self Care | Payer: Self-pay | Source: Ambulatory Visit | Attending: Vascular Surgery

## 2021-03-12 ENCOUNTER — Encounter (HOSPITAL_COMMUNITY): Payer: Self-pay | Admitting: Vascular Surgery

## 2021-03-12 ENCOUNTER — Ambulatory Visit (HOSPITAL_COMMUNITY)
Admission: RE | Admit: 2021-03-12 | Discharge: 2021-03-12 | Disposition: A | Discharge status: Home / Self Care | Payer: BC Managed Care – PPO | Source: Ambulatory Visit | Attending: Nephrology | Admitting: Nephrology

## 2021-03-12 ENCOUNTER — Ambulatory Visit (HOSPITAL_COMMUNITY)
Admission: RE | Admit: 2021-03-12 | Discharge: 2021-03-12 | Disposition: A | Discharge status: Home / Self Care | Payer: BC Managed Care – PPO | Source: Ambulatory Visit | Attending: Vascular Surgery | Admitting: Vascular Surgery

## 2021-03-12 ENCOUNTER — Ambulatory Visit (HOSPITAL_COMMUNITY): Payer: BC Managed Care – PPO | Admitting: Physician Assistant

## 2021-03-12 DIAGNOSIS — Z9483 Pancreas transplant status: Secondary | ICD-10-CM | POA: Insufficient documentation

## 2021-03-12 DIAGNOSIS — Z794 Long term (current) use of insulin: Secondary | ICD-10-CM | POA: Insufficient documentation

## 2021-03-12 DIAGNOSIS — E1122 Type 2 diabetes mellitus with diabetic chronic kidney disease: Secondary | ICD-10-CM | POA: Insufficient documentation

## 2021-03-12 DIAGNOSIS — N185 Chronic kidney disease, stage 5: Secondary | ICD-10-CM | POA: Diagnosis not present

## 2021-03-12 DIAGNOSIS — Z940 Kidney transplant status: Secondary | ICD-10-CM | POA: Insufficient documentation

## 2021-03-12 DIAGNOSIS — Z7984 Long term (current) use of oral hypoglycemic drugs: Secondary | ICD-10-CM | POA: Insufficient documentation

## 2021-03-12 DIAGNOSIS — N186 End stage renal disease: Secondary | ICD-10-CM | POA: Insufficient documentation

## 2021-03-12 DIAGNOSIS — E1151 Type 2 diabetes mellitus with diabetic peripheral angiopathy without gangrene: Secondary | ICD-10-CM | POA: Insufficient documentation

## 2021-03-12 DIAGNOSIS — X58XXXA Exposure to other specified factors, initial encounter: Secondary | ICD-10-CM | POA: Insufficient documentation

## 2021-03-12 DIAGNOSIS — T829XXA Unspecified complication of cardiac and vascular prosthetic device, implant and graft, initial encounter: Secondary | ICD-10-CM | POA: Insufficient documentation

## 2021-03-12 DIAGNOSIS — D631 Anemia in chronic kidney disease: Secondary | ICD-10-CM

## 2021-03-12 DIAGNOSIS — I120 Hypertensive chronic kidney disease with stage 5 chronic kidney disease or end stage renal disease: Secondary | ICD-10-CM | POA: Insufficient documentation

## 2021-03-12 DIAGNOSIS — N184 Chronic kidney disease, stage 4 (severe): Secondary | ICD-10-CM

## 2021-03-12 DIAGNOSIS — Z992 Dependence on renal dialysis: Secondary | ICD-10-CM | POA: Insufficient documentation

## 2021-03-12 HISTORY — PX: AV FISTULA PLACEMENT: SHX1204

## 2021-03-12 HISTORY — DX: Legal blindness, as defined in USA: H54.8

## 2021-03-12 HISTORY — DX: Unspecified cataract: H26.9

## 2021-03-12 LAB — POCT I-STAT, CHEM 8
BUN: 80 mg/dL — ABNORMAL HIGH (ref 8–23)
Calcium, Ion: 1.15 mmol/L (ref 1.15–1.40)
Chloride: 112 mmol/L — ABNORMAL HIGH (ref 98–111)
Creatinine, Ser: 6.5 mg/dL — ABNORMAL HIGH (ref 0.61–1.24)
Glucose, Bld: 147 mg/dL — ABNORMAL HIGH (ref 70–99)
HCT: 30 % — ABNORMAL LOW (ref 39.0–52.0)
Hemoglobin: 10.2 g/dL — ABNORMAL LOW (ref 13.0–17.0)
Potassium: 4.8 mmol/L (ref 3.5–5.1)
Sodium: 139 mmol/L (ref 135–145)
TCO2: 17 mmol/L — ABNORMAL LOW (ref 22–32)

## 2021-03-12 LAB — GLUCOSE, CAPILLARY
Glucose-Capillary: 143 mg/dL — ABNORMAL HIGH (ref 70–99)
Glucose-Capillary: 156 mg/dL — ABNORMAL HIGH (ref 70–99)

## 2021-03-12 SURGERY — ARTERIOVENOUS (AV) FISTULA CREATION
Anesthesia: Monitor Anesthesia Care | Site: Arm Lower | Laterality: Right

## 2021-03-12 MED ORDER — CHLORHEXIDINE GLUCONATE 4 % EX LIQD: 60.0000 mL | Freq: Once | CUTANEOUS | Status: DC

## 2021-03-12 MED ORDER — ONDANSETRON HCL 4 MG/2ML IJ SOLN
INTRAMUSCULAR | Status: DC | PRN
  Administered 2021-03-12: 4 mg via INTRAVENOUS

## 2021-03-12 MED ORDER — PROPOFOL 10 MG/ML IV BOLUS
INTRAVENOUS | Status: AC
  Filled 2021-03-12: qty 20

## 2021-03-12 MED ORDER — CHLORHEXIDINE GLUCONATE 0.12 % MT SOLN
15.0000 mL | OROMUCOSAL | Status: AC
  Administered 2021-03-12: 15 mL via OROMUCOSAL
  Filled 2021-03-12: qty 15

## 2021-03-12 MED ORDER — FENTANYL CITRATE (PF) 250 MCG/5ML IJ SOLN
INTRAMUSCULAR | Status: AC
  Filled 2021-03-12: qty 5

## 2021-03-12 MED ORDER — EPOETIN ALFA-EPBX 10000 UNIT/ML IJ SOLN
20000.0000 [IU] | INTRAMUSCULAR | Status: DC
  Administered 2021-03-12: 20000 [IU] via SUBCUTANEOUS

## 2021-03-12 MED ORDER — OXYCODONE-ACETAMINOPHEN 5-325 MG PO TABS: 1.0000 | ORAL_TABLET | Freq: Four times a day (QID) | ORAL | 0 refills | Status: DC | PRN

## 2021-03-12 MED ORDER — FENTANYL CITRATE (PF) 250 MCG/5ML IJ SOLN
INTRAMUSCULAR | Status: DC | PRN
  Administered 2021-03-12: 50 ug via INTRAVENOUS

## 2021-03-12 MED ORDER — MIDAZOLAM HCL 2 MG/2ML IJ SOLN
INTRAMUSCULAR | Status: AC
  Filled 2021-03-12: qty 2

## 2021-03-12 MED ORDER — FENTANYL CITRATE (PF) 100 MCG/2ML IJ SOLN: 25.0000 ug | INTRAMUSCULAR | Status: DC | PRN

## 2021-03-12 MED ORDER — PHENYLEPHRINE HCL-NACL 20-0.9 MG/250ML-% IV SOLN
INTRAVENOUS | Status: DC | PRN
  Administered 2021-03-12: 20 ug/min via INTRAVENOUS

## 2021-03-12 MED ORDER — ACETAMINOPHEN 500 MG PO TABS
1000.0000 mg | ORAL_TABLET | Freq: Once | ORAL | Status: AC
  Administered 2021-03-12: 1000 mg via ORAL

## 2021-03-12 MED ORDER — EPOETIN ALFA-EPBX 10000 UNIT/ML IJ SOLN
INTRAMUSCULAR | Status: AC
  Filled 2021-03-12: qty 2

## 2021-03-12 MED ORDER — LIDOCAINE-EPINEPHRINE (PF) 1 %-1:200000 IJ SOLN
INTRAMUSCULAR | Status: AC
  Filled 2021-03-12: qty 30

## 2021-03-12 MED ORDER — MIDAZOLAM HCL 2 MG/2ML IJ SOLN
INTRAMUSCULAR | Status: DC | PRN
  Administered 2021-03-12 (×2): 1 mg via INTRAVENOUS

## 2021-03-12 MED ORDER — HEMOSTATIC AGENTS (NO CHARGE) OPTIME
TOPICAL | Status: DC | PRN
  Administered 2021-03-12: 1 via TOPICAL

## 2021-03-12 MED ORDER — HEPARIN 6000 UNIT IRRIGATION SOLUTION
Status: DC | PRN
  Administered 2021-03-12: 1

## 2021-03-12 MED ORDER — CEFAZOLIN SODIUM-DEXTROSE 2-4 GM/100ML-% IV SOLN
2.0000 g | INTRAVENOUS | Status: AC
  Administered 2021-03-12: 2 g via INTRAVENOUS
  Filled 2021-03-12: qty 100

## 2021-03-12 MED ORDER — ACETAMINOPHEN 500 MG PO TABS
ORAL_TABLET | ORAL | Status: AC
  Filled 2021-03-12: qty 2

## 2021-03-12 MED ORDER — LIDOCAINE HCL (PF) 1 % IJ SOLN
INTRAMUSCULAR | Status: AC
  Filled 2021-03-12: qty 30

## 2021-03-12 MED ORDER — 0.9 % SODIUM CHLORIDE (POUR BTL) OPTIME
TOPICAL | Status: DC | PRN
  Administered 2021-03-12: 1000 mL

## 2021-03-12 MED ORDER — HEPARIN 6000 UNIT IRRIGATION SOLUTION
Status: AC
  Filled 2021-03-12: qty 500

## 2021-03-12 MED ORDER — PROPOFOL 500 MG/50ML IV EMUL
INTRAVENOUS | Status: DC | PRN
  Administered 2021-03-12: 75 ug/kg/min via INTRAVENOUS

## 2021-03-12 MED ORDER — SODIUM CHLORIDE 0.9 % IV SOLN: INTRAVENOUS | Status: DC

## 2021-03-12 SURGICAL SUPPLY — 42 items
ADH SKN CLS APL DERMABOND .7 (GAUZE/BANDAGES/DRESSINGS) ×1
ADH SKN CLS LQ APL DERMABOND (GAUZE/BANDAGES/DRESSINGS) ×1
AGENT HMST SPONGE THK3/8 (HEMOSTASIS)
ARMBAND PINK RESTRICT EXTREMIT (MISCELLANEOUS) ×3 IMPLANT
BAG COUNTER SPONGE SURGICOUNT (BAG) ×3 IMPLANT
BAG SPNG CNTER NS LX DISP (BAG) ×1
BLADE CLIPPER SURG (BLADE) ×3 IMPLANT
CANISTER SUCT 3000ML PPV (MISCELLANEOUS) ×3 IMPLANT
CLIP LIGATING EXTRA MED SLVR (CLIP) ×3 IMPLANT
CLIP LIGATING EXTRA SM BLUE (MISCELLANEOUS) ×3 IMPLANT
COVER PROBE W GEL 5X96 (DRAPES) ×3 IMPLANT
DECANTER SPIKE VIAL GLASS SM (MISCELLANEOUS) ×3 IMPLANT
DERMABOND ADHESIVE PROPEN (GAUZE/BANDAGES/DRESSINGS) ×1
DERMABOND ADVANCED (GAUZE/BANDAGES/DRESSINGS) ×1
DERMABOND ADVANCED .7 DNX12 (GAUZE/BANDAGES/DRESSINGS) ×2 IMPLANT
DERMABOND ADVANCED .7 DNX6 (GAUZE/BANDAGES/DRESSINGS) IMPLANT
ELECT REM PT RETURN 9FT ADLT (ELECTROSURGICAL) ×2
ELECTRODE REM PT RTRN 9FT ADLT (ELECTROSURGICAL) ×2 IMPLANT
GLOVE SRG 8 PF TXTR STRL LF DI (GLOVE) ×4 IMPLANT
GLOVE SURG POLYISO LF SZ8 (GLOVE) IMPLANT
GLOVE SURG UNDER POLY LF SZ8 (GLOVE) ×4
GOWN STRL REUS W/ TWL LRG LVL3 (GOWN DISPOSABLE) ×4 IMPLANT
GOWN STRL REUS W/TWL 2XL LVL3 (GOWN DISPOSABLE) ×3 IMPLANT
GOWN STRL REUS W/TWL LRG LVL3 (GOWN DISPOSABLE) ×4
HEMOSTAT SNOW SURGICEL 2X4 (HEMOSTASIS) ×1 IMPLANT
HEMOSTAT SPONGE AVITENE ULTRA (HEMOSTASIS) IMPLANT
KIT BASIN OR (CUSTOM PROCEDURE TRAY) ×3 IMPLANT
KIT TURNOVER KIT B (KITS) ×3 IMPLANT
NS IRRIG 1000ML POUR BTL (IV SOLUTION) ×3 IMPLANT
PACK CV ACCESS (CUSTOM PROCEDURE TRAY) ×3 IMPLANT
PAD ARMBOARD 7.5X6 YLW CONV (MISCELLANEOUS) ×6 IMPLANT
SUT MNCRL AB 4-0 PS2 18 (SUTURE) ×3 IMPLANT
SUT PROLENE 6 0 BV (SUTURE) ×4 IMPLANT
SUT PROLENE 7 0 BV 1 (SUTURE) IMPLANT
SUT SILK 2 0 SH (SUTURE) ×2 IMPLANT
SUT SILK 2 0 SH CR/8 (SUTURE) ×1 IMPLANT
SUT SILK 3 0 SH CR/8 (SUTURE) ×2 IMPLANT
SUT VIC AB 3-0 SH 27 (SUTURE) ×2
SUT VIC AB 3-0 SH 27X BRD (SUTURE) ×2 IMPLANT
TOWEL GREEN STERILE (TOWEL DISPOSABLE) ×3 IMPLANT
UNDERPAD 30X36 HEAVY ABSORB (UNDERPADS AND DIAPERS) ×3 IMPLANT
WATER STERILE IRR 1000ML POUR (IV SOLUTION) ×3 IMPLANT

## 2021-03-12 NOTE — Anesthesia Procedure Notes (Signed)
Procedure Name: MAC Date/Time: 03/12/2021 7:36 AM Performed by: Carolan Clines, CRNA Pre-anesthesia Checklist: Patient identified, Emergency Drugs available, Suction available and Patient being monitored Patient Re-evaluated:Patient Re-evaluated prior to induction Oxygen Delivery Method: Simple face mask Dental Injury: Teeth and Oropharynx as per pre-operative assessment

## 2021-03-12 NOTE — Transfer of Care (Signed)
Immediate Anesthesia Transfer of Care Note  Patient: Eric Mccarty  Procedure(s) Performed: RIGHT ARM FISTULA CREATION (Right: Arm Lower)  Patient Location: PACU  Anesthesia Type:MAC combined with regional for post-op pain  Level of Consciousness: drowsy  Airway & Oxygen Therapy: Patient Spontanous Breathing  Post-op Assessment: Report given to RN and Post -op Vital signs reviewed and stable  Post vital signs: Reviewed and stable  Last Vitals:  Vitals Value Taken Time  BP 129/63 03/12/21 0907  Temp    Pulse 56 03/12/21 0908  Resp 10 03/12/21 0908  SpO2 97 % 03/12/21 0908  Vitals shown include unvalidated device data.  Last Pain:  Vitals:   03/12/21 0625  TempSrc:   PainSc: 0-No pain      Patients Stated Pain Goal: 3 (39/53/20 2334)  Complications: No notable events documented.

## 2021-03-12 NOTE — Op Note (Signed)
° ° °  NAME: Eric Mccarty    MRN: [id number] DOB: 10-Jan-1956    DATE OF OPERATION: 03/12/2021  PREOP DIAGNOSIS:    Chronic kidney disease stage V  POSTOP DIAGNOSIS:    Same  PROCEDURE:    Right arm brachiocephalic fistula  SURGEON: Broadus John  ASSIST: Paul Half PA  ANESTHESIA: Moderate, block  EBL: 15 mL  INDICATIONS:    Eric Mccarty is a 65 y.o. male with history of end-stage renal disease, previous kidney and pancreas transplant in 2002.  Over the last several months, his graft has began to fail.  Currently, Eric Mccarty has chronic kidney disease stage V.  I saw him in the office and discussed fistula creation, which she has had in the past.  Prior to creation, I performed bilateral upper extremity venogram to ensure there was no central stenosis.  There was not.  I made the decision to pursue right-sided brachiocephalic fistula creation.  After discussing the risk and benefits of surgery, Eric Mccarty elected to proceed with  FINDINGS:   4 mm cephalic vein, 3.5 mm brachial artery  TECHNIQUE:   A right upper extremity block was performed by the anesthesia team. The patient was brought to the operating room and placed in supine position. The right arm was prepped and draped in standard fashion. IV antibiotics were administered. A timeout was performed.   The case began with ultrasound insonation of the brachial artery and cephalic vein, which demonstrated sufficient size at the antecubital fossa for arteriovenous fistula.   A transverse incision was made below the elbow creese in the antecubital fossa. The  cephalic vein was isolated for 3 cm in length. Next the aponeurosis was partially released and the brachial artery secured with a vessel loop. The cephalic vein was transected and ligated distally with a 2-0 silk stick-tie. The vein was dilated with coronary dilators and flushed with heparin saline. Vascular clamps were placed proximally and distally on the brachial  artery and a 5 mm arteriotomy was created on the brachial artery. This was flushed with heparin saline.  An anastomosis was created in end to side fashion on the brachial artery using running 6-0 Prolene suture.  Prior to completing the anastomosis, the vessels were flushed and the suture line was tied down. There was an excellent thrill in the cephalic vein. No kinking was appreciated. The patient had a multiphasic radial and ulnar signal. He had an excellent doppler signal in the fistula. The incision was irrigated and hemostasis achieved with cautery and suture. The deeper tissue was closed with 3-0 Vicryl and the skin closed with 4-0 Monocryl.  Dermabond was applied to the incisions. The patient was transferred to PACU in stable condition.   Given the complexity of the case a first assistant was necessary in order to expedient the procedure and safely perform the technical aspects of the operation.  Macie Burows, MD [facility]  DATE OF DICTATION:   03/12/2021

## 2021-03-12 NOTE — H&P (Signed)
Office Note   Patient seen and examined in preop holding.  No complaints. No changes to medication history or physical exam since last seen in clinic. After discussing the risks and benefits of right arm fistula creation, Eric Mccarty elected to proceed.   Eric John MD   CC: Stage V chronic kidney disease Requesting Provider:  No ref. provider found  HPI: Eric Mccarty is a 65 y.o. (07-21-1955) male presenting at the request of .Corliss Parish, MD for new permanent HD access.  Patient has history of left-sided radiocephalic fistula, forearm loop graft, right sided tunneled HD line. Currently without access, not undergoing dialysis.   Eric Mccarty has long history of kidney disease and diabetes, and underwent kidney, pancreas DCD transplant in 2002.  Both kidney and pancreas have been functioning well until recently.  Over the last several months, has had a significant decrease in his GFR.  His pancreas continues to function however he does have to supplement insulin.   The pt is  on a statin for cholesterol management.  The pt is  on a daily aspirin.   Other AC:  - The pt is  on medication for hypertension.   The pt is  diabetic.  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   Hyperlipidemia    Hyperparathyroidism ([place])    Hypertension    Iron deficiency    Legally blind    Renal disorder 2005   kidney transplant at [facility]    Past Surgical History:  Procedure Laterality Date   APPLICATION OF WOUND VAC Left 04/20/2017   Procedure: APPLICATION OF WOUND VAC LEFT LOWER EXTREMITY;  Surgeon: Wallace Going, DO;  Location: [place];  Service: Plastics;  Laterality: Left;   AV FISTULA PLACEMENT      COLONOSCOPY     COMBINED KIDNEY-PANCREAS TRANSPLANT     EMBOLECTOMY Left 01/15/2013   Procedure: EMBOLECTOMY Left Brachial Artery With Patch Angioplasty;  Surgeon: Elam Dutch, MD;  Location: [place];  Service: Vascular;  Laterality: Left;   EYE SURGERY     HERNIA REPAIR     KIDNEY TRANSPLANT     NEPHRECTOMY TRANSPLANTED ORGAN     SKIN SPLIT GRAFT Left 05/18/2017   Procedure: SKIN GRAFT SPLIT THICKNESS FROM LEFT UPPER LEG TO LEFT LOWER LEG WOUND;  Surgeon: Wallace Going, DO;  Location: [place];  Service: Plastics;  Laterality: Left;   UPPER EXTREMITY VENOGRAPHY Bilateral 03/11/2021   Procedure: UPPER EXTREMITY VENOGRAPHY;  Surgeon: Eric John, MD;  Location: [facility];  Service: Cardiovascular;  Laterality: Bilateral;   WOUND DEBRIDEMENT  05/18/2017   Procedure: DEBRIDEMENT LEFT LOWER LEG WOUND;  Surgeon: Wallace Going, DO;  Location: [place];  Service: Plastics;;    Social History   Socioeconomic History   Marital status: Married    Spouse name: Not on file   Number of children: 2   Years of education: Not on file   Highest education level: Not on file  Occupational History   Not on file  Tobacco Use   Smoking status: Never   Smokeless tobacco: Never  Vaping Use   Vaping Use: Never  used  Substance and Sexual Activity   Alcohol use: No   Drug use: No   Sexual activity: Not on file  Other Topics Concern   Not on file  Social History Narrative   Lives with wife   Social Determinants of Health   Financial Resource Strain: Not on file  Food Insecurity: Not on file  Transportation Needs: Not on file  Physical Activity: Not on file  Stress: Not on file  Social Connections: Not on file  Intimate Partner Violence: Not on file    Family History  Problem Relation Age of Onset   Diabetes Mother    Hypertension Mother    Hypertension Father    Alzheimer's disease Father    Kidney disease Father      Current Facility-Administered Medications  Medication Dose Route Frequency Provider Last Rate Last Admin   0.9 %  sodium chloride infusion   Intravenous Continuous Eric John, MD   New Bag at 03/12/21 [phone]   acetaminophen (TYLENOL) 500 MG tablet            ceFAZolin (ANCEF) IVPB 2g/100 mL premix  2 g Intravenous 30 min Pre-Op Eric John, MD       chlorhexidine (HIBICLENS) 4 % liquid 4 application  60 mL Topical Once Eric John, MD       And   [START ON 03/13/2021] chlorhexidine (HIBICLENS) 4 % liquid 4 application  60 mL Topical Once Eric John, MD        No Known Allergies   REVIEW OF SYSTEMS:   [X]  denotes positive finding, [ ]  denotes negative finding Cardiac  Comments:  Chest pain or chest pressure:    Shortness of breath upon exertion:    Short of breath when lying flat:    Irregular heart rhythm:        Vascular    Pain in calf, thigh, or hip brought on by ambulation:    Pain in feet at night that wakes you up from your sleep:     Blood clot in your veins:    Leg swelling:         Pulmonary    Oxygen at home:    Productive cough:     Wheezing:         Neurologic    Sudden weakness in arms or legs:     Sudden numbness in arms or legs:     Sudden onset of difficulty speaking or slurred speech:    Temporary loss of vision in one eye:     Problems with dizziness:         Gastrointestinal    Blood in stool:     Vomited blood:         Genitourinary    Burning when urinating:     Blood in urine:        Psychiatric    Major depression:         Hematologic    Bleeding problems:    Problems with blood clotting too easily:        Skin    Rashes or ulcers:        Constitutional    Fever or chills:      PHYSICAL EXAMINATION:  Vitals:   03/12/21 0549 03/12/21 0611  BP: (!) 200/63 (!) 196/58  Pulse: 66   Resp: 18   Temp: 97.6 F (36.4 C)   TempSrc: Oral   SpO2: 97%   Weight: 72.1 kg  Height: 5\' 6"  (1.676 m)      General:  WDWN in NAD; vital signs documented above Gait: Not observed HENT: WNL, normocephalic Pulmonary: normal non-labored breathing , without wheezing Cardiac: regular HR,  Abdomen: soft, NT, no masses Skin: without rashes Vascular Exam/Pulses:  Right Left  Radial 2+ (normal) 2+ (normal)  Ulnar 2+ (normal) 2+ (normal)                   Extremities: without ischemic changes, without Gangrene , without cellulitis; without open wounds;  Musculoskeletal: no muscle wasting or atrophy  Neurologic: A&O X 3;  No focal weakness or paresthesias are detected Psychiatric:  The pt has Normal affect.   Non-Invasive Vascular Imaging:   Noninvasive recent venous duplex ultrasonography demonstrated appropriate sized left basilic vein, right cephalic vein, right basilic vein Arterial duplex ultrasonography demonstrated adequate brachial artery size bilaterally.    ASSESSMENT/PLAN: Eric Mccarty is a 65 y.o. male presenting with chronic kidney disease stage V.  He is currently not on dialysis, with no tunneled lines. Stevens vein mapping demonstrated adequate conduit for AV fistula creation at the left basilic, right cephalic, right basilic.  Would like to use the right cephalic for fistula creation, but I am worried about possible central stenosis as the patient has had a previous tunneled line in the right internal jugular vein years ago.  After discussing the risks and benefits of bilateral upper extremity venogram to assess for central stenosis, Eric Mccarty elected to proceed.  If there is no stenosis present on the right, I will create a right sided brachiocephalic fistula the following day.  Eric John, MD [facility] [phone]

## 2021-03-12 NOTE — Anesthesia Preprocedure Evaluation (Addendum)
Anesthesia Evaluation  Patient identified by MRN, date of birth, ID band Patient awake    Reviewed: Allergy & Precautions, NPO status , Patient's Chart, lab work & pertinent test results, reviewed documented beta blocker date and time   Airway Mallampati: II  TM Distance: >3 FB Neck ROM: Full    Dental no notable dental hx. (+) Teeth Intact, Dental Advisory Given   Pulmonary neg pulmonary ROS,    Pulmonary exam normal breath sounds clear to auscultation       Cardiovascular hypertension, Pt. on medications and Pt. on home beta blockers + Peripheral Vascular Disease  Normal cardiovascular exam Rhythm:Regular Rate:Normal  TTE 2014 Normal EF, no significant valvular abnormalities   Neuro/Psych negative neurological ROS  negative psych ROS   GI/Hepatic negative GI ROS, Neg liver ROS,   Endo/Other  diabetes, Type 2, Insulin Dependent, Oral Hypoglycemic Agents  Renal/GU Renal diseaseStage V CKD S/p kidney/pancreas transplant 2002  negative genitourinary   Musculoskeletal negative musculoskeletal ROS (+)   Abdominal   Peds  Hematology  (+) Blood dyscrasia, anemia ,   Anesthesia Other Findings history of left-sided radiocephalic fistula, forearm loop graft, right sided tunneled HD line. Currently without access, not undergoing dialysis  Reproductive/Obstetrics                            Anesthesia Physical Anesthesia Plan  ASA: 3  Anesthesia Plan: MAC and Regional   Post-op Pain Management: Regional block and Tylenol PO (pre-op)   Induction: Intravenous  PONV Risk Score and Plan: Propofol infusion, Treatment may vary due to age or medical condition, Midazolam and Ondansetron  Airway Management Planned: Natural Airway  Additional Equipment:   Intra-op Plan:   Post-operative Plan:   Informed Consent: I have reviewed the patients History and Physical, chart, labs and discussed the  procedure including the risks, benefits and alternatives for the proposed anesthesia with the patient or authorized representative who has indicated his/her understanding and acceptance.     Dental advisory given  Plan Discussed with: CRNA  Anesthesia Plan Comments:         Anesthesia Quick Evaluation

## 2021-03-12 NOTE — Discharge Instructions (Addendum)
[facility]  Discharge Instructions  AV Fistula or Graft Surgery for Dialysis Access  Please refer to the following instructions for your post-procedure care. Your surgeon or physician assistant will discuss any changes with you.  Activity  You may drive the day following your surgery, if you are comfortable and no longer taking prescription pain medication. Resume full activity as the soreness in your incision resolves.  Bathing/Showering  You may shower after you go home. Keep your incision dry for 48 hours. Do not soak in a bathtub, hot tub, or swim until the incision heals completely. You may not shower if you have a hemodialysis catheter.  Incision Care  Clean your incision with mild soap and water after 48 hours. Pat the area dry with a clean towel. You do not need a bandage unless otherwise instructed. Do not apply any ointments or creams to your incision. You may have skin glue on your incision. Do not peel it off. It will come off on its own in about one week. Your arm may swell a bit after surgery. To reduce swelling use pillows to elevate your arm so it is above your heart. Your doctor will tell you if you need to lightly wrap your arm with an ACE bandage.  Diet  Resume your normal diet. There are not special food restrictions following this procedure. In order to heal from your surgery, it is CRITICAL to get adequate nutrition. Your body requires vitamins, minerals, and protein. Vegetables are the best source of vitamins and minerals. Vegetables also provide the perfect balance of protein. Processed food has little nutritional value, so try to avoid this.  Medications  Resume taking all of your medications. If your incision is causing pain, you may take over-the counter pain relievers such as acetaminophen (Tylenol). If you were prescribed a stronger pain medication, please be aware these medications can cause nausea and constipation. Prevent  nausea by taking the medication with a snack or meal. Avoid constipation by drinking plenty of fluids and eating foods with high amount of fiber, such as fruits, vegetables, and grains.  Do not take Tylenol if you are taking prescription pain medications.  Follow up Your surgeon may want to see you in the office following your access surgery. If so, this will be arranged at the time of your surgery.  Please call us immediately for any of the following conditions:  Increased pain, redness, drainage (pus) from your incision site Fever of 101 degrees or higher Severe or worsening pain at your incision site Hand pain or numbness.  Reduce your risk of vascular disease:  Stop smoking. If you would like help, call [facility] at 1-800-QUIT-NOW (907) 217-7425) or [place] at [place] your cholesterol Maintain a desired weight Control your diabetes Keep your blood pressure down  Dialysis  It will take several weeks to several months for your new dialysis access to be ready for use. Your surgeon will determine when it is okay to use it. Your nephrologist will continue to direct your dialysis. You can continue to use your Permcath until your new access is ready for use.   03/12/2021 Lennox Solders [id number] November 10, 1955  Surgeon(s): Broadus John, MD  Procedure(s): RIGHT ARM FISTULA CREATION   May stick graft immediately   May stick graft on designated area only:   X Do not stick right arm AVF for 12 weeks    If you have any questions, please call the office at [phone].anesth

## 2021-03-13 ENCOUNTER — Encounter (HOSPITAL_COMMUNITY): Payer: BC Managed Care – PPO

## 2021-03-13 ENCOUNTER — Encounter (HOSPITAL_COMMUNITY): Payer: Self-pay | Admitting: Vascular Surgery

## 2021-03-16 ENCOUNTER — Encounter (HOSPITAL_COMMUNITY): Payer: Self-pay | Admitting: Vascular Surgery

## 2021-03-16 MED ORDER — MEPIVACAINE HCL (PF) 2 % IJ SOLN
INTRAMUSCULAR | Status: DC | PRN
  Administered 2021-03-12: 20 mL

## 2021-03-16 NOTE — Anesthesia Procedure Notes (Addendum)
°  Anesthesia Regional Block: Supraclavicular block   Pre-Anesthetic Checklist: , timeout performed,  Correct Patient, Correct Site, Correct Laterality,  Correct Procedure, Correct Position, site marked,  Risks and benefits discussed,  Surgical consent,  Pre-op evaluation,  At surgeon's request and post-op pain management  Laterality: Right  Prep: Maximum Sterile Barrier Precautions used, chloraprep       Needles:  Injection technique: Single-shot  Needle Type: Echogenic Stimulator Needle     Needle Length: 5cm  Needle Gauge: 22     Additional Needles:   Procedures:,,,, ultrasound used (permanent image in chart),,    Narrative:  Start time: 03/12/2021 7:15 AM End time: 03/12/2021 7:20 AM Injection made incrementally with aspirations every 5 mL.  Performed by: Personally  Anesthesiologist: Freddrick March, MD  Additional Notes: Monitors applied. No increased pain on injection. No increased resistance to injection. Injection made in 5cc increments. Good needle visualization. Patient tolerated procedure well.

## 2021-03-16 NOTE — Addendum Note (Signed)
Addendum  created 03/16/21 0856 by Freddrick March, MD   Clinical Note Signed, Intraprocedure Blocks edited

## 2021-03-16 NOTE — Anesthesia Postprocedure Evaluation (Signed)
Anesthesia Post Note  Patient: Eric Mccarty  Procedure(s) Performed: RIGHT ARM FISTULA CREATION (Right: Arm Lower)     Patient location during evaluation: PACU Anesthesia Type: Regional and MAC Level of consciousness: awake and alert Pain management: pain level controlled Vital Signs Assessment: post-procedure vital signs reviewed and stable Respiratory status: spontaneous breathing, nonlabored ventilation, respiratory function stable and patient connected to nasal cannula oxygen Cardiovascular status: stable and blood pressure returned to baseline Postop Assessment: no apparent nausea or vomiting Anesthetic complications: no   No notable events documented.  Last Vitals:  Vitals:   03/12/21 0907 03/12/21 0921  BP: 129/63 (!) 150/64  Pulse: (!) 56 (!) 58  Resp: 10 14  Temp: 36.7 C 36.7 C  SpO2: 100% 96%    Last Pain:  Vitals:   03/12/21 0921  TempSrc:   PainSc: 0-No pain   Pain Goal: Patients Stated Pain Goal: 3 (03/12/21 0625)                 Haywood Lasso L Imane Burrough

## 2021-03-26 ENCOUNTER — Inpatient Hospital Stay (HOSPITAL_COMMUNITY): Admit: 2021-03-26 | Payer: BC Managed Care – PPO

## 2021-03-31 ENCOUNTER — Encounter (HOSPITAL_COMMUNITY)
Admission: RE | Admit: 2021-03-31 | Discharge: 2021-03-31 | Disposition: A | Discharge status: Home / Self Care | Payer: BC Managed Care – PPO | Source: Ambulatory Visit | Attending: Nephrology | Admitting: Nephrology

## 2021-03-31 VITALS — BP 162/62 | HR 62 | Temp 97.6°F | Resp 18

## 2021-03-31 DIAGNOSIS — D631 Anemia in chronic kidney disease: Secondary | ICD-10-CM | POA: Insufficient documentation

## 2021-03-31 DIAGNOSIS — N189 Chronic kidney disease, unspecified: Secondary | ICD-10-CM | POA: Diagnosis present

## 2021-03-31 LAB — IRON AND TIBC
Iron: 76 ug/dL (ref 45–182)
Saturation Ratios: 29 % (ref 17.9–39.5)
TIBC: 262 ug/dL (ref 250–450)
UIBC: 186 ug/dL

## 2021-03-31 LAB — FERRITIN: Ferritin: 117 ng/mL (ref 24–336)

## 2021-03-31 LAB — POCT HEMOGLOBIN-HEMACUE: Hemoglobin: 9.6 g/dL — ABNORMAL LOW (ref 13.0–17.0)

## 2021-03-31 MED ORDER — EPOETIN ALFA-EPBX 10000 UNIT/ML IJ SOLN
INTRAMUSCULAR | Status: AC
  Filled 2021-03-31: qty 2

## 2021-03-31 MED ORDER — EPOETIN ALFA-EPBX 10000 UNIT/ML IJ SOLN
20000.0000 [IU] | INTRAMUSCULAR | Status: DC
  Administered 2021-03-31: 20000 [IU] via SUBCUTANEOUS

## 2021-04-04 ENCOUNTER — Other Ambulatory Visit: Payer: Self-pay

## 2021-04-04 DIAGNOSIS — N185 Chronic kidney disease, stage 5: Secondary | ICD-10-CM

## 2021-04-09 ENCOUNTER — Encounter (HOSPITAL_COMMUNITY): Payer: BC Managed Care – PPO

## 2021-04-09 NOTE — Progress Notes (Deleted)
POST OPERATIVE OFFICE NOTE    CC:  F/u for surgery  HPI:  This is a 66 y.o. male who is s/p right BC AVF on 03/12/2021 by Dr. Virl Cagey.  Pt has hx of left RC AVF and left FA AVG in the past.   Pt has hx of brachial, ulnar and radial artery embolectomy in 2014 by Dr. Oneida Alar. Pt states ***he does *** have pain/numbness in *** hand.    The pt *** on dialysis *** at *** location.   No Known Allergies  Current Outpatient Medications  Medication Sig Dispense Refill   amitriptyline (ELAVIL) 25 MG tablet Take 25 mg by mouth at bedtime.     amLODipine (NORVASC) 10 MG tablet Take 10 mg by mouth at bedtime.  6   aspirin 81 MG tablet Take 81 mg by mouth daily.     atorvastatin (LIPITOR) 10 MG tablet Take 10 mg by mouth daily at 6 PM.      calcitRIOL (ROCALTROL) 0.5 MCG capsule Take 0.5-1 mcg by mouth See admin instructions. Take 1 mg on Monday, Wednesday and Friday morning and evening Take 0.5 all the other days in the Morning only     Cyanocobalamin (VITAMIN B-12 PO) Take by mouth.     dorzolamide-timolol (COSOPT) 22.3-6.8 MG/ML ophthalmic solution Place 1 drop into the left eye 2 (two) times daily.     furosemide (LASIX) 40 MG tablet Take 80 mg by mouth 2 (two) times daily.     gabapentin (NEURONTIN) 600 MG tablet Take 600 mg by mouth at bedtime.     glipiZIDE (GLUCOTROL) 10 MG tablet Take 10 mg by mouth daily.     hydrALAZINE (APRESOLINE) 25 MG tablet Take 25 mg by mouth 2 (two) times daily.  6   insulin glargine (LANTUS) 100 UNIT/ML injection Inject 6 Units into the skin at bedtime.     labetalol (NORMODYNE) 200 MG tablet Take 200-400 mg by mouth See admin instructions. Take 200 mg  tablet in the morning and take 400 mg in the evening     lipase/protease/amylase (CREON) 36000 UNITS CPEP capsule Take 1 capsule by mouth daily as needed (Stomach problem).     MAGNESIUM GLUCONATE PO Take 1,200 mg by mouth 2 (two) times daily.     mycophenolate (CELLCEPT) 250 MG capsule Take 250 mg by mouth 2  (two) times daily.     oxyCODONE-acetaminophen (PERCOCET) 5-325 MG tablet Take 1 tablet by mouth every 6 (six) hours as needed for severe pain. 6 tablet 0   pioglitazone (ACTOS) 45 MG tablet Take 45 mg by mouth daily.     prednisoLONE acetate (PRED FORTE) 1 % ophthalmic suspension Place 1 drop into the left eye 2 (two) times daily.     sorbitol 70 % solution Take 15 mLs by mouth daily as needed (constipation).      tacrolimus (PROGRAF) 1 MG capsule Take 1 mg by mouth 2 (two) times daily.     No current facility-administered medications for this visit.     ROS:  See HPI  Physical Exam:  ***  Incision:  *** Extremities:   There *** a palpable *** pulse.   Motor and sensory *** in tact.   There *** a thrill/bruit present.  The fistula/graft *** easily palpable   Dialysis Duplex on 04/17/2021: Diameter:  *** Depth:  ***   Assessment/Plan:  This is a 66 y.o. male who is s/p: right BC AVF on 03/12/2021 by Dr. Virl Cagey  -the pt does *** have  evidence of steal. -the fistula/graft can be used ***. -If pt has a tunneled dialysis catheter and the access has been used successfully to the satisfaction of the dialysis center, the tunneled catheter can be scheduled to be removed at their discretion.   -discussed with pt that access does not last forever and will need intervention or even new access at some point.  -the pt will follow up ***   Leontine Locket, [facility] [phone]  Clinic MD:  Virl Cagey

## 2021-04-14 ENCOUNTER — Encounter (HOSPITAL_COMMUNITY): Payer: BC Managed Care – PPO

## 2021-04-17 ENCOUNTER — Encounter (HOSPITAL_COMMUNITY): Payer: BC Managed Care – PPO

## 2021-04-20 ENCOUNTER — Ambulatory Visit: Payer: BC Managed Care – PPO

## 2021-04-23 ENCOUNTER — Ambulatory Visit (INDEPENDENT_AMBULATORY_CARE_PROVIDER_SITE_OTHER): Payer: BC Managed Care – PPO | Admitting: Physician Assistant

## 2021-04-23 ENCOUNTER — Other Ambulatory Visit: Payer: Self-pay

## 2021-04-23 ENCOUNTER — Ambulatory Visit (HOSPITAL_COMMUNITY)
Admission: RE | Admit: 2021-04-23 | Discharge: 2021-04-23 | Disposition: A | Discharge status: Home / Self Care | Payer: BC Managed Care – PPO | Source: Ambulatory Visit | Attending: Vascular Surgery | Admitting: Vascular Surgery

## 2021-04-23 VITALS — BP 154/55 | HR 68 | Temp 98.2°F | Resp 18 | Ht 66.0 in | Wt 154.0 lb

## 2021-04-23 DIAGNOSIS — N185 Chronic kidney disease, stage 5: Secondary | ICD-10-CM | POA: Diagnosis present

## 2021-04-23 DIAGNOSIS — Z992 Dependence on renal dialysis: Secondary | ICD-10-CM

## 2021-04-23 DIAGNOSIS — N186 End stage renal disease: Secondary | ICD-10-CM

## 2021-04-23 NOTE — Progress Notes (Signed)
POST OPERATIVE OFFICE NOTE    CC:  F/u for surgery  HPI:  This is a 66 y.o. male who is s/p right AV fistula on 03/12/21 by Dr. Virl Cagey.  He has a working right [place]. He denies symptoms of steal in the right UE.  He has history of mild carpal tunnel syndrome prior to surgery.    Patient has history of left-sided radiocephalic fistula, forearm loop graft, right sided tunneled HD line.  He has a history pancreas DCD transplant in 2002 which lasted for 20 + years.    No Known Allergies  Current Outpatient Medications  Medication Sig Dispense Refill   amitriptyline (ELAVIL) 25 MG tablet Take 25 mg by mouth at bedtime.     amLODipine (NORVASC) 10 MG tablet Take 5 mg by mouth at bedtime. Per patient dose change from 10 mg to 5 mg po at bedtime  6   aspirin 81 MG tablet Take 81 mg by mouth daily.     atorvastatin (LIPITOR) 10 MG tablet Take 10 mg by mouth daily at 6 PM.      calcitRIOL (ROCALTROL) 0.5 MCG capsule Take 0.5-1 mcg by mouth See admin instructions. Take 1 mg on Monday, Wednesday and Friday morning and evening Take 0.5 all the other days in the Morning only     dorzolamide-timolol (COSOPT) 22.3-6.8 MG/ML ophthalmic solution Place 1 drop into the left eye 2 (two) times daily.     furosemide (LASIX) 40 MG tablet Take 80 mg by mouth daily.     gabapentin (NEURONTIN) 600 MG tablet Take 300 mg by mouth at bedtime. Per patient dose change from 600mg  to 300mg  taken at bedtime     glipiZIDE (GLUCOTROL) 10 MG tablet Take 10 mg by mouth daily.     insulin glargine (LANTUS) 100 UNIT/ML injection Inject 6 Units into the skin at bedtime.     labetalol (NORMODYNE) 200 MG tablet Take 200-400 mg by mouth See admin instructions. Take 200 mg  tablet in the morning and take 400 mg in the evening     lipase/protease/amylase (CREON) 36000 UNITS CPEP capsule Take 1 capsule by mouth daily as needed (Stomach problem).     mycophenolate (CELLCEPT) 250 MG capsule Take 250 mg by mouth 2 (two) times daily.      pioglitazone (ACTOS) 45 MG tablet Take 45 mg by mouth daily.     prednisoLONE acetate (PRED FORTE) 1 % ophthalmic suspension Place 1 drop into the left eye 2 (two) times daily.     sorbitol 70 % solution Take 15 mLs by mouth daily as needed (constipation).      tacrolimus (PROGRAF) 1 MG capsule Take 1 mg by mouth 2 (two) times daily.     Cyanocobalamin (VITAMIN B-12 PO) Take by mouth. (Patient not taking: Reported on 04/23/2021)     hydrALAZINE (APRESOLINE) 25 MG tablet Take 25 mg by mouth 2 (two) times daily. (Patient not taking: Reported on 04/23/2021)  6   MAGNESIUM GLUCONATE PO Take 1,200 mg by mouth 2 (two) times daily. (Patient not taking: Reported on 04/23/2021)     oxyCODONE-acetaminophen (PERCOCET) 5-325 MG tablet Take 1 tablet by mouth every 6 (six) hours as needed for severe pain. (Patient not taking: Reported on 04/23/2021) 6 tablet 0   No current facility-administered medications for this visit.     ROS:  See HPI  Physical Exam:     Findings:  +--------------------+----------+-----------------+---------+   AVF  PSV (cm/s) Flow Vol (mL/min) Comments    +--------------------+----------+-----------------+---------+   Native artery inflow    414            539                    +--------------------+----------+-----------------+---------+   AVF Anastomosis         800                       >800 cm/s   +--------------------+----------+-----------------+---------+      +------------+----------+-------------+----------+--------+   OUTFLOW VEIN PSV (cm/s) Diameter (cm) Depth (cm) Describe   +------------+----------+-------------+----------+--------+   Shoulder        133         0.53         0.55               +------------+----------+-------------+----------+--------+   Prox UA         172         0.50         0.38               +------------+----------+-------------+----------+--------+   Mid UA          176         0.58         0.35                +------------+----------+-------------+----------+--------+   Dist UA         111         0.60         0.45               +------------+----------+-------------+----------+--------+   AC Fossa        398         0.81         0.24               +------------+----------+-------------+----------+--------+          Summary:  Patent arteriovenous fistula. Elevated velocities at the anastomosis  appear to  be due to a retained valve.   Incision:  well healed Extremities:  palpable radial pulse, palp[able thrill in fistula.  He has decreased sensation in the median nerve distribution and this is at baseline since prior to surgery.      Assessment/Plan:  This is a 66 y.o. male who is s/p: right AV fistula on 03/12/21 by Dr. Virl Cagey  The fistula has acceptable depth < 0.6 cm.  The diameter in the central portion of the fistula is < 0.6 cm.  I asked him to do exercises and f/u in 4 weeks for repeat duplex.  If the fistula fails to reach an acceptable diameter he may need a fistula gram with venoplasty and/or branch ligation to assist with maturity.  On physical exam there is a large side branch  that was not noted on duplex.    Good palpable thrill with no symptoms of steal.  He is on HD via right TDC placed else where.      Roxy Horseman PA-C [facility] [phone]   Call MD:  Stanford Breed

## 2021-04-28 ENCOUNTER — Other Ambulatory Visit: Payer: Self-pay | Admitting: *Deleted

## 2021-04-28 ENCOUNTER — Encounter (HOSPITAL_COMMUNITY): Payer: BC Managed Care – PPO

## 2021-04-28 DIAGNOSIS — N186 End stage renal disease: Secondary | ICD-10-CM

## 2021-05-12 ENCOUNTER — Other Ambulatory Visit: Payer: Self-pay

## 2021-05-12 DIAGNOSIS — N186 End stage renal disease: Secondary | ICD-10-CM

## 2021-05-12 NOTE — Progress Notes (Signed)
Error

## 2021-05-13 ENCOUNTER — Telehealth: Payer: Self-pay | Admitting: *Deleted

## 2021-05-13 NOTE — Telephone Encounter (Signed)
[id number] check up on condition and family.  Doing well as can be expected.  Has started dialysis and is trying to get disability and medicaid settled.  Wife who ha her own health issues si doing well at this time.

## 2021-05-15 ENCOUNTER — Encounter (HOSPITAL_COMMUNITY): Payer: Self-pay

## 2021-05-21 ENCOUNTER — Inpatient Hospital Stay (HOSPITAL_COMMUNITY): Admission: RE | Admit: 2021-05-21 | Payer: BC Managed Care – PPO | Source: Ambulatory Visit

## 2021-05-21 ENCOUNTER — Ambulatory Visit: Payer: BC Managed Care – PPO

## 2021-06-15 DIAGNOSIS — Z0271 Encounter for disability determination: Secondary | ICD-10-CM

## 2021-06-22 ENCOUNTER — Telehealth: Payer: Self-pay

## 2021-06-22 NOTE — Telephone Encounter (Signed)
Encounter opened in error

## 2021-06-22 NOTE — Telephone Encounter (Signed)
Returned call to [facility], stated that pt's R UA dialysis access is warm to touch and has had some thin discharge. She reports that pt stated he "lanced the abscess over the weekend with a needle that he sterilized" states pt denies pain and does not have fever. Pt is at dialysis center today. Will call pt to schedule an appointment to be seen.  ?

## 2021-06-24 ENCOUNTER — Ambulatory Visit (HOSPITAL_COMMUNITY)
Admission: RE | Admit: 2021-06-24 | Discharge: 2021-06-24 | Disposition: A | Discharge status: Home / Self Care | Payer: BC Managed Care – PPO | Source: Ambulatory Visit | Attending: Vascular Surgery | Admitting: Vascular Surgery

## 2021-06-24 DIAGNOSIS — N186 End stage renal disease: Secondary | ICD-10-CM | POA: Diagnosis present

## 2021-06-24 DIAGNOSIS — Z992 Dependence on renal dialysis: Secondary | ICD-10-CM | POA: Diagnosis present

## 2021-06-29 ENCOUNTER — Encounter (HOSPITAL_COMMUNITY): Payer: Self-pay

## 2021-06-29 NOTE — Progress Notes (Signed)
?HISTORY AND PHYSICAL  ? ? ? ?CC:  dialysis access ?Requesting Provider:  Corliss Parish, MD ? ?HPI: This is a 66 y.o. male here for evaluation of his hemodialysis access.  Pt has underwent right brachiocephalic AVF on 93/81/8299 by Dr. Virl Cagey.  He was seen been back in January and there was an area in the fistula that was slow to mature and he was brought back to get duplex to check maturation.   ? ?Dialysis access history: ?Patient has history of left-sided radiocephalic fistula, forearm loop graft, right sided tunneled HD line. ? ?has long history of kidney disease and diabetes, and underwent kidney, pancreas DCD transplant in 2002.  Both kidney and pancreas have been functioning well until recently. ? ?He states that if he uses his phone for a long period of time, the thumb, 1st and middle fingers will go numb but if he straightens his arm, it improves.  He states if his blood sugar drops, his right hand will go numb and if it is bad enough, it also happens in the left hand.   ? ?He states he had a blister pop up over the incision on Sunday.  He states he sterilized a needle and scratched it til it  popped.  He states that it did have pus come out.  He states that it has healed since then.   ? ?The pt is right hand dominant.    ?Pt is on dialysis.   ?Days of dialysis if applicable:  M/W/F    ?HD center if applicable:  [facility] location. ? ?Pt has hx of left brachial, ulnar and radial artery embolectomy by Dr. Oneida Alar 01/15/2013.  ? ?The pt is on a statin for cholesterol management.  ?The pt is on a daily aspirin.  Other AC:  none ?The pt is on CCB, BB, diuretic for hypertension.  ?The pt is diabetic.   ?Tobacco hx:  never ? ?Past Medical History:  ?Diagnosis Date  ? Anemia of chronic renal failure, unspecified CKD stage   ? Cancer [facility])   ? basal cell ca lt lower leg  ? Cataracts, bilateral   ? Diabetes mellitus without complication ([place])   ? Hx of kidney transplant 2005  ? and pancreas  ? Hyperlipidemia    ? Hyperparathyroidism ([place])   ? Hypertension   ? Iron deficiency   ? Legally blind   ? Renal disorder 2005  ? kidney transplant at [facility]  ? ? ?Past Surgical History:  ?Procedure Laterality Date  ? APPLICATION OF WOUND VAC Left 04/20/2017  ? Procedure: APPLICATION OF WOUND VAC LEFT LOWER EXTREMITY;  Surgeon: Wallace Going, DO;  Location: [place];  Service: Plastics;  Laterality: Left;  ? AV FISTULA PLACEMENT    ? AV FISTULA PLACEMENT Right 03/12/2021  ? Procedure: RIGHT ARM FISTULA CREATION;  Surgeon: Broadus John, MD;  Location: [facility] OR;  Service: Vascular;  Laterality: Right;  PERIPHERAL NERVE BLOCK  ? COLONOSCOPY    ? COMBINED KIDNEY-PANCREAS TRANSPLANT    ? EMBOLECTOMY Left 01/15/2013  ? Procedure: EMBOLECTOMY Left Brachial Artery With Patch Angioplasty;  Surgeon: Elam Dutch, MD;  Location: [place];  Service: Vascular;  Laterality: Left;  ? EYE SURGERY    ? HERNIA REPAIR    ? KIDNEY TRANSPLANT    ? NEPHRECTOMY TRANSPLANTED ORGAN    ? SKIN SPLIT GRAFT Left 05/18/2017  ? Procedure: SKIN GRAFT SPLIT THICKNESS FROM LEFT UPPER LEG TO LEFT LOWER LEG WOUND;  Surgeon: Marla Roe,  Loel Lofty, DO;  Location: [place];  Service: Plastics;  Laterality: Left;  ? UPPER EXTREMITY VENOGRAPHY Bilateral 03/11/2021  ? Procedure: UPPER EXTREMITY VENOGRAPHY;  Surgeon: Broadus John, MD;  Location: [place] [facility];  Service: Cardiovascular;  Laterality: Bilateral;  ? WOUND DEBRIDEMENT  05/18/2017  ? Procedure: DEBRIDEMENT LEFT LOWER LEG WOUND;  Surgeon: Wallace Going, DO;  Location: [place];  Service: Plastics;;  ? ? ?No Known Allergies ? ?Current Outpatient Medications  ?Medication Sig Dispense Refill  ? amitriptyline (ELAVIL) 25 MG tablet Take 25 mg by mouth at bedtime.    ? amLODipine (NORVASC) 10 MG tablet Take 5 mg by mouth at bedtime. Per patient dose change from 10 mg to 5 mg po at bedtime  6  ? aspirin 81 MG tablet Take 81 mg by mouth daily.    ?  atorvastatin (LIPITOR) 10 MG tablet Take 10 mg by mouth daily at 6 PM.     ? calcitRIOL (ROCALTROL) 0.5 MCG capsule Take 0.5-1 mcg by mouth See admin instructions. Take 1 mg on Monday, Wednesday and Friday morning and evening ?Take 0.5 all the other days in the Morning only    ? Cyanocobalamin (VITAMIN B-12 PO) Take by mouth. (Patient not taking: Reported on 04/23/2021)    ? dorzolamide-timolol (COSOPT) 22.3-6.8 MG/ML ophthalmic solution Place 1 drop into the left eye 2 (two) times daily.    ? furosemide (LASIX) 40 MG tablet Take 80 mg by mouth daily.    ? gabapentin (NEURONTIN) 600 MG tablet Take 300 mg by mouth at bedtime. Per patient dose change from '600mg'$  to '300mg'$  taken at bedtime    ? glipiZIDE (GLUCOTROL) 10 MG tablet Take 10 mg by mouth daily.    ? hydrALAZINE (APRESOLINE) 25 MG tablet Take 25 mg by mouth 2 (two) times daily. (Patient not taking: Reported on 04/23/2021)  6  ? insulin glargine (LANTUS) 100 UNIT/ML injection Inject 6 Units into the skin at bedtime.    ? labetalol (NORMODYNE) 200 MG tablet Take 200-400 mg by mouth See admin instructions. Take 200 mg  tablet in the morning and take 400 mg in the evening    ? lipase/protease/amylase (CREON) 36000 UNITS CPEP capsule Take 1 capsule by mouth daily as needed (Stomach problem).    ? MAGNESIUM GLUCONATE PO Take 1,200 mg by mouth 2 (two) times daily. (Patient not taking: Reported on 04/23/2021)    ? mycophenolate (CELLCEPT) 250 MG capsule Take 250 mg by mouth 2 (two) times daily.    ? oxyCODONE-acetaminophen (PERCOCET) 5-325 MG tablet Take 1 tablet by mouth every 6 (six) hours as needed for severe pain. (Patient not taking: Reported on 04/23/2021) 6 tablet 0  ? pioglitazone (ACTOS) 45 MG tablet Take 45 mg by mouth daily.    ? prednisoLONE acetate (PRED FORTE) 1 % ophthalmic suspension Place 1 drop into the left eye 2 (two) times daily.    ? sorbitol 70 % solution Take 15 mLs by mouth daily as needed (constipation).     ? tacrolimus (PROGRAF) 1 MG capsule  Take 1 mg by mouth 2 (two) times daily.    ? ?No current facility-administered medications for this visit.  ? ? ?Family History  ?Problem Relation Age of Onset  ? Diabetes Mother   ? Hypertension Mother   ? Hypertension Father   ? Alzheimer's disease Father   ? Kidney disease Father   ? ? ?Social History  ? ?Socioeconomic History  ? Marital  status: Married  ?  Spouse name: Not on file  ? Number of children: 2  ? Years of education: Not on file  ? Highest education level: Not on file  ?Occupational History  ? Not on file  ?Tobacco Use  ? Smoking status: Never  ? Smokeless tobacco: Never  ?Vaping Use  ? Vaping Use: Never used  ?Substance and Sexual Activity  ? Alcohol use: No  ? Drug use: No  ? Sexual activity: Not on file  ?Other Topics Concern  ? Not on file  ?Social History Narrative  ? Lives with wife  ? ?Social Determinants of Health  ? ?Financial Resource Strain: Not on file  ?Food Insecurity: Not on file  ?Transportation Needs: Not on file  ?Physical Activity: Not on file  ?Stress: Not on file  ?Social Connections: Not on file  ?Intimate Partner Violence: Not on file  ? ? ? ?ROS: '[x]'$  Positive   '[ ]'$  Negative   '[ ]'$  All sytems reviewed and are negative ? ?Cardiac: ?'[]'$  chest pain/pressure ?'[]'$  SOB ?'[]'$  DOE ? ?Vascular: ?'[]'$  pain in legs while walking ?'[]'$  pain in feet when lying flat ?'[]'$  hx of DVT ?'[]'$  swelling in legs ? ?Pulmonary: ?'[]'$  asthma ?'[]'$  wheezing ? ?Neurologic: ?'[]'$  weakness in '[]'$  arms '[]'$  legs ?'[]'$  numbness in '[]'$  arms '[]'$  legs ?'[]'$ difficulty speaking or slurred speech ? ?Hematologic: ?'[]'$  bleeding problems ? ?GI ?'[]'$  GERD ? ?GU: ?'[x]'$  CKD/renal failure  '[x]'$  HD---'[x]'$  M/W/F '[]'$  T/T/S ? ?Psychiatric: ?'[]'$  hx of major depression ? ?Integumentary: ?'[]'$  rashes '[]'$  ulcers ? ?Constitutional: ?'[]'$  fever '[]'$  chills ? ? ?PHYSICAL EXAMINATION: ? ?Today's Vitals  ? 06/30/21 1017  ?BP: (!) 141/58  ?Pulse: 60  ?Resp: 18  ?Temp: 98.1 ?F (36.7 ?C)  ?TempSrc: Temporal  ?SpO2: 97%  ?Weight: 153 lb 11.2 oz (69.7 kg)  ?Height: '5\' 6"'$  (1.676 m)   ?PainSc: 0-No pain  ? ?Body mass index is 24.81 kg/m?. ? ? ? ?General:  WDWN male in NAD ?Gait: Not observed ?HENT: WNL ?Pulmonary: normal non-labored breathing  ?Cardiac: regular ?Skin: without rashes

## 2021-06-30 ENCOUNTER — Ambulatory Visit (INDEPENDENT_AMBULATORY_CARE_PROVIDER_SITE_OTHER): Payer: Self-pay | Admitting: Physician Assistant

## 2021-06-30 VITALS — BP 141/58 | HR 60 | Temp 98.1°F | Resp 18 | Ht 66.0 in | Wt 153.7 lb

## 2021-06-30 DIAGNOSIS — N186 End stage renal disease: Secondary | ICD-10-CM

## 2021-06-30 DIAGNOSIS — Z992 Dependence on renal dialysis: Secondary | ICD-10-CM

## 2021-10-12 ENCOUNTER — Encounter (HOSPITAL_COMMUNITY): Payer: Self-pay

## 2021-11-18 ENCOUNTER — Other Ambulatory Visit: Payer: Self-pay

## 2021-11-18 ENCOUNTER — Emergency Department (HOSPITAL_COMMUNITY): Payer: BC Managed Care – PPO

## 2021-11-18 ENCOUNTER — Encounter (HOSPITAL_COMMUNITY): Payer: Self-pay | Admitting: Emergency Medicine

## 2021-11-18 ENCOUNTER — Inpatient Hospital Stay (HOSPITAL_COMMUNITY)
Admission: EM | Admit: 2021-11-18 | Discharge: 2021-11-21 | DRG: 871 | Disposition: A | Discharge status: Home / Self Care | Payer: BC Managed Care – PPO | Attending: Internal Medicine | Admitting: Internal Medicine

## 2021-11-18 DIAGNOSIS — J189 Pneumonia, unspecified organism: Secondary | ICD-10-CM | POA: Diagnosis present

## 2021-11-18 DIAGNOSIS — Z79899 Other long term (current) drug therapy: Secondary | ICD-10-CM

## 2021-11-18 DIAGNOSIS — Z841 Family history of disorders of kidney and ureter: Secondary | ICD-10-CM

## 2021-11-18 DIAGNOSIS — E213 Hyperparathyroidism, unspecified: Secondary | ICD-10-CM | POA: Diagnosis present

## 2021-11-18 DIAGNOSIS — A419 Sepsis, unspecified organism: Secondary | ICD-10-CM | POA: Diagnosis not present

## 2021-11-18 DIAGNOSIS — I998 Other disorder of circulatory system: Secondary | ICD-10-CM

## 2021-11-18 DIAGNOSIS — R739 Hyperglycemia, unspecified: Secondary | ICD-10-CM

## 2021-11-18 DIAGNOSIS — Z7982 Long term (current) use of aspirin: Secondary | ICD-10-CM

## 2021-11-18 DIAGNOSIS — E8770 Fluid overload, unspecified: Secondary | ICD-10-CM | POA: Diagnosis present

## 2021-11-18 DIAGNOSIS — Y830 Surgical operation with transplant of whole organ as the cause of abnormal reaction of the patient, or of later complication, without mention of misadventure at the time of the procedure: Secondary | ICD-10-CM | POA: Diagnosis present

## 2021-11-18 DIAGNOSIS — E872 Acidosis, unspecified: Secondary | ICD-10-CM | POA: Diagnosis present

## 2021-11-18 DIAGNOSIS — R0902 Hypoxemia: Secondary | ICD-10-CM

## 2021-11-18 DIAGNOSIS — H548 Legal blindness, as defined in USA: Secondary | ICD-10-CM | POA: Diagnosis present

## 2021-11-18 DIAGNOSIS — D631 Anemia in chronic kidney disease: Secondary | ICD-10-CM | POA: Diagnosis present

## 2021-11-18 DIAGNOSIS — T8611 Kidney transplant rejection: Secondary | ICD-10-CM

## 2021-11-18 DIAGNOSIS — J811 Chronic pulmonary edema: Secondary | ICD-10-CM | POA: Diagnosis present

## 2021-11-18 DIAGNOSIS — I120 Hypertensive chronic kidney disease with stage 5 chronic kidney disease or end stage renal disease: Secondary | ICD-10-CM | POA: Diagnosis present

## 2021-11-18 DIAGNOSIS — Z833 Family history of diabetes mellitus: Secondary | ICD-10-CM

## 2021-11-18 DIAGNOSIS — R6520 Severe sepsis without septic shock: Secondary | ICD-10-CM | POA: Diagnosis present

## 2021-11-18 DIAGNOSIS — K8689 Other specified diseases of pancreas: Secondary | ICD-10-CM | POA: Diagnosis present

## 2021-11-18 DIAGNOSIS — E1165 Type 2 diabetes mellitus with hyperglycemia: Secondary | ICD-10-CM | POA: Diagnosis present

## 2021-11-18 DIAGNOSIS — E785 Hyperlipidemia, unspecified: Secondary | ICD-10-CM | POA: Diagnosis present

## 2021-11-18 DIAGNOSIS — J9601 Acute respiratory failure with hypoxia: Secondary | ICD-10-CM | POA: Diagnosis present

## 2021-11-18 DIAGNOSIS — Z9483 Pancreas transplant status: Secondary | ICD-10-CM

## 2021-11-18 DIAGNOSIS — Z992 Dependence on renal dialysis: Secondary | ICD-10-CM

## 2021-11-18 DIAGNOSIS — N2581 Secondary hyperparathyroidism of renal origin: Secondary | ICD-10-CM | POA: Diagnosis present

## 2021-11-18 DIAGNOSIS — Z20822 Contact with and (suspected) exposure to covid-19: Secondary | ICD-10-CM | POA: Diagnosis present

## 2021-11-18 DIAGNOSIS — Z85828 Personal history of other malignant neoplasm of skin: Secondary | ICD-10-CM

## 2021-11-18 DIAGNOSIS — N186 End stage renal disease: Secondary | ICD-10-CM | POA: Diagnosis present

## 2021-11-18 DIAGNOSIS — E1122 Type 2 diabetes mellitus with diabetic chronic kidney disease: Secondary | ICD-10-CM | POA: Diagnosis present

## 2021-11-18 DIAGNOSIS — Z8249 Family history of ischemic heart disease and other diseases of the circulatory system: Secondary | ICD-10-CM

## 2021-11-18 DIAGNOSIS — Z794 Long term (current) use of insulin: Secondary | ICD-10-CM

## 2021-11-18 DIAGNOSIS — J90 Pleural effusion, not elsewhere classified: Secondary | ICD-10-CM | POA: Diagnosis present

## 2021-11-18 DIAGNOSIS — Z820 Family history of epilepsy and other diseases of the nervous system: Secondary | ICD-10-CM

## 2021-11-18 DIAGNOSIS — T8612 Kidney transplant failure: Secondary | ICD-10-CM | POA: Diagnosis present

## 2021-11-18 LAB — CBC WITH DIFFERENTIAL/PLATELET
Abs Immature Granulocytes: 0.17 10*3/uL — ABNORMAL HIGH (ref 0.00–0.07)
Basophils Absolute: 0.1 10*3/uL (ref 0.0–0.1)
Basophils Relative: 1 %
Eosinophils Absolute: 0.6 10*3/uL — ABNORMAL HIGH (ref 0.0–0.5)
Eosinophils Relative: 4 %
HCT: 29.8 % — ABNORMAL LOW (ref 39.0–52.0)
Hemoglobin: 9.7 g/dL — ABNORMAL LOW (ref 13.0–17.0)
Immature Granulocytes: 1 %
Lymphocytes Relative: 6 %
Lymphs Abs: 0.9 10*3/uL (ref 0.7–4.0)
MCH: 31.7 pg (ref 26.0–34.0)
MCHC: 32.6 g/dL (ref 30.0–36.0)
MCV: 97.4 fL (ref 80.0–100.0)
Monocytes Absolute: 1 10*3/uL (ref 0.1–1.0)
Monocytes Relative: 7 %
Neutro Abs: 12 10*3/uL — ABNORMAL HIGH (ref 1.7–7.7)
Neutrophils Relative %: 81 %
Platelets: 242 10*3/uL (ref 150–400)
RBC: 3.06 MIL/uL — ABNORMAL LOW (ref 4.22–5.81)
RDW: 13.4 % (ref 11.5–15.5)
WBC: 14.8 10*3/uL — ABNORMAL HIGH (ref 4.0–10.5)
nRBC: 0 % (ref 0.0–0.2)

## 2021-11-18 LAB — BASIC METABOLIC PANEL
Anion gap: 20 — ABNORMAL HIGH (ref 5–15)
BUN: 23 mg/dL (ref 8–23)
CO2: 25 mmol/L (ref 22–32)
Calcium: 8.9 mg/dL (ref 8.9–10.3)
Chloride: 89 mmol/L — ABNORMAL LOW (ref 98–111)
Creatinine, Ser: 4.37 mg/dL — ABNORMAL HIGH (ref 0.61–1.24)
GFR, Estimated: 14 mL/min — ABNORMAL LOW (ref >60)
Glucose, Bld: 611 mg/dL (ref 70–99)
Potassium: 3.9 mmol/L (ref 3.5–5.1)
Sodium: 134 mmol/L — ABNORMAL LOW (ref 135–145)

## 2021-11-18 LAB — SARS CORONAVIRUS 2 BY RT PCR: SARS Coronavirus 2 by RT PCR: NEGATIVE

## 2021-11-18 MED ORDER — INSULIN ASPART 100 UNIT/ML IJ SOLN
20.0000 [IU] | Freq: Once | INTRAMUSCULAR | Status: AC
  Administered 2021-11-19: 20 [IU] via INTRAVENOUS

## 2021-11-18 MED ORDER — IOHEXOL 350 MG/ML SOLN
100.0000 mL | Freq: Once | INTRAVENOUS | Status: AC | PRN
  Administered 2021-11-19: 75 mL via INTRAVENOUS

## 2021-11-18 NOTE — ED Notes (Signed)
Date and time results received: 11/18/21 2345 (use smartphrase ".now" to insert current time)  Test: Glucose Critical Value: 611  Name of Provider Notified: Sharyn Lull  Orders Received? Or Actions Taken?:  notified

## 2021-11-18 NOTE — ED Notes (Signed)
Pt 89% on RA. Pt placed on 2L [place] 

## 2021-11-18 NOTE — ED Provider Triage Note (Signed)
Emergency Medicine Provider Triage Evaluation Note  Eric Mccarty , a 66 y.o. male  was evaluated in triage.  Pt complains of shortness of breath and dry cough.  Dry cough started 3 weeks ago however, worsened over the past week.  Patient has a history of ESRD on dialysis Monday/Wednesday/Friday.  Last dialysis earlier today.  Denies associated chest pain.  He endorses some intermittent wheezing. No fever  Review of Systems  Positive: SOB Negative: fever  Physical Exam  BP (!) 120/57 (BP Location: Left Arm)   Pulse 88   Temp 97.8 F (36.6 C) (Oral)   Resp 18   SpO2 93%  Gen:   Awake, no distress   Resp:  Normal effort  MSK:   Moves extremities without difficulty  Other:    Medical Decision Making  Medically screening exam initiated at 8:00 PM.  Appropriate orders placed.  Eric Mccarty was informed that the remainder of the evaluation will be completed by another provider, this initial triage assessment does not replace that evaluation, and the importance of remaining in the ED until their evaluation is complete.  SOB- labs, CXR, EKG   Karie Kirks 11/18/21 2001

## 2021-11-18 NOTE — ED Triage Notes (Signed)
Pt c/o shortness of breath and a dry cough. Cough started x 3 weeks ago. Dialysis pt, MWF, denies missing any treatments, no new swelling.

## 2021-11-19 ENCOUNTER — Emergency Department (HOSPITAL_COMMUNITY): Payer: BC Managed Care – PPO

## 2021-11-19 ENCOUNTER — Inpatient Hospital Stay (HOSPITAL_COMMUNITY): Payer: BC Managed Care – PPO

## 2021-11-19 DIAGNOSIS — Z9483 Pancreas transplant status: Secondary | ICD-10-CM | POA: Diagnosis not present

## 2021-11-19 DIAGNOSIS — Z794 Long term (current) use of insulin: Secondary | ICD-10-CM | POA: Diagnosis not present

## 2021-11-19 DIAGNOSIS — Y830 Surgical operation with transplant of whole organ as the cause of abnormal reaction of the patient, or of later complication, without mention of misadventure at the time of the procedure: Secondary | ICD-10-CM | POA: Diagnosis present

## 2021-11-19 DIAGNOSIS — R6520 Severe sepsis without septic shock: Secondary | ICD-10-CM | POA: Diagnosis present

## 2021-11-19 DIAGNOSIS — E8770 Fluid overload, unspecified: Secondary | ICD-10-CM | POA: Diagnosis present

## 2021-11-19 DIAGNOSIS — N186 End stage renal disease: Secondary | ICD-10-CM | POA: Diagnosis present

## 2021-11-19 DIAGNOSIS — A419 Sepsis, unspecified organism: Secondary | ICD-10-CM

## 2021-11-19 DIAGNOSIS — Z992 Dependence on renal dialysis: Secondary | ICD-10-CM

## 2021-11-19 DIAGNOSIS — J811 Chronic pulmonary edema: Secondary | ICD-10-CM | POA: Diagnosis present

## 2021-11-19 DIAGNOSIS — J189 Pneumonia, unspecified organism: Secondary | ICD-10-CM

## 2021-11-19 DIAGNOSIS — T8612 Kidney transplant failure: Secondary | ICD-10-CM | POA: Diagnosis present

## 2021-11-19 DIAGNOSIS — E1122 Type 2 diabetes mellitus with diabetic chronic kidney disease: Secondary | ICD-10-CM | POA: Diagnosis present

## 2021-11-19 DIAGNOSIS — K8689 Other specified diseases of pancreas: Secondary | ICD-10-CM | POA: Diagnosis present

## 2021-11-19 DIAGNOSIS — H548 Legal blindness, as defined in USA: Secondary | ICD-10-CM | POA: Diagnosis present

## 2021-11-19 DIAGNOSIS — N2581 Secondary hyperparathyroidism of renal origin: Secondary | ICD-10-CM | POA: Diagnosis present

## 2021-11-19 DIAGNOSIS — J9601 Acute respiratory failure with hypoxia: Secondary | ICD-10-CM | POA: Diagnosis present

## 2021-11-19 DIAGNOSIS — R739 Hyperglycemia, unspecified: Secondary | ICD-10-CM

## 2021-11-19 DIAGNOSIS — Z20822 Contact with and (suspected) exposure to covid-19: Secondary | ICD-10-CM | POA: Diagnosis present

## 2021-11-19 DIAGNOSIS — E872 Acidosis, unspecified: Secondary | ICD-10-CM | POA: Diagnosis present

## 2021-11-19 DIAGNOSIS — E785 Hyperlipidemia, unspecified: Secondary | ICD-10-CM | POA: Diagnosis present

## 2021-11-19 DIAGNOSIS — D631 Anemia in chronic kidney disease: Secondary | ICD-10-CM | POA: Diagnosis present

## 2021-11-19 DIAGNOSIS — T8611 Kidney transplant rejection: Secondary | ICD-10-CM

## 2021-11-19 DIAGNOSIS — Z820 Family history of epilepsy and other diseases of the nervous system: Secondary | ICD-10-CM | POA: Diagnosis not present

## 2021-11-19 DIAGNOSIS — E1165 Type 2 diabetes mellitus with hyperglycemia: Secondary | ICD-10-CM | POA: Diagnosis present

## 2021-11-19 DIAGNOSIS — J90 Pleural effusion, not elsewhere classified: Secondary | ICD-10-CM | POA: Diagnosis present

## 2021-11-19 DIAGNOSIS — E213 Hyperparathyroidism, unspecified: Secondary | ICD-10-CM | POA: Diagnosis present

## 2021-11-19 DIAGNOSIS — I120 Hypertensive chronic kidney disease with stage 5 chronic kidney disease or end stage renal disease: Secondary | ICD-10-CM | POA: Diagnosis present

## 2021-11-19 LAB — BRAIN NATRIURETIC PEPTIDE: B Natriuretic Peptide: 2788.5 pg/mL — ABNORMAL HIGH (ref 0.0–100.0)

## 2021-11-19 LAB — BASIC METABOLIC PANEL
Anion gap: 18 — ABNORMAL HIGH (ref 5–15)
BUN: 28 mg/dL — ABNORMAL HIGH (ref 8–23)
CO2: 25 mmol/L (ref 22–32)
Calcium: 9.2 mg/dL (ref 8.9–10.3)
Chloride: 91 mmol/L — ABNORMAL LOW (ref 98–111)
Creatinine, Ser: 5.05 mg/dL — ABNORMAL HIGH (ref 0.61–1.24)
GFR, Estimated: 12 mL/min — ABNORMAL LOW (ref >60)
Glucose, Bld: 340 mg/dL — ABNORMAL HIGH (ref 70–99)
Potassium: 3.4 mmol/L — ABNORMAL LOW (ref 3.5–5.1)
Sodium: 134 mmol/L — ABNORMAL LOW (ref 135–145)

## 2021-11-19 LAB — I-STAT VENOUS BLOOD GAS, ED
Acid-Base Excess: 4 mmol/L — ABNORMAL HIGH (ref 0.0–2.0)
Bicarbonate: 28 mmol/L (ref 20.0–28.0)
Calcium, Ion: 0.96 mmol/L — ABNORMAL LOW (ref 1.15–1.40)
HCT: 30 % — ABNORMAL LOW (ref 39.0–52.0)
Hemoglobin: 10.2 g/dL — ABNORMAL LOW (ref 13.0–17.0)
O2 Saturation: 99 %
Potassium: 3.9 mmol/L (ref 3.5–5.1)
Sodium: 129 mmol/L — ABNORMAL LOW (ref 135–145)
TCO2: 29 mmol/L (ref 22–32)
pCO2, Ven: 36.8 mmHg — ABNORMAL LOW (ref 44–60)
pH, Ven: 7.489 — ABNORMAL HIGH (ref 7.25–7.43)
pO2, Ven: 109 mmHg — ABNORMAL HIGH (ref 32–45)

## 2021-11-19 LAB — CBG MONITORING, ED
Glucose-Capillary: 107 mg/dL — ABNORMAL HIGH (ref 70–99)
Glucose-Capillary: 166 mg/dL — ABNORMAL HIGH (ref 70–99)
Glucose-Capillary: 247 mg/dL — ABNORMAL HIGH (ref 70–99)
Glucose-Capillary: 256 mg/dL — ABNORMAL HIGH (ref 70–99)
Glucose-Capillary: 323 mg/dL — ABNORMAL HIGH (ref 70–99)
Glucose-Capillary: 380 mg/dL — ABNORMAL HIGH (ref 70–99)
Glucose-Capillary: 557 mg/dL (ref 70–99)
Glucose-Capillary: >600 mg/dL (ref 70–99)

## 2021-11-19 LAB — CBC
HCT: 24.7 % — ABNORMAL LOW (ref 39.0–52.0)
Hemoglobin: 8 g/dL — ABNORMAL LOW (ref 13.0–17.0)
MCH: 31.9 pg (ref 26.0–34.0)
MCHC: 32.4 g/dL (ref 30.0–36.0)
MCV: 98.4 fL (ref 80.0–100.0)
Platelets: 204 10*3/uL (ref 150–400)
RBC: 2.51 MIL/uL — ABNORMAL LOW (ref 4.22–5.81)
RDW: 13.4 % (ref 11.5–15.5)
WBC: 13.7 10*3/uL — ABNORMAL HIGH (ref 4.0–10.5)
nRBC: 0 % (ref 0.0–0.2)

## 2021-11-19 LAB — PHOSPHORUS: Phosphorus: 3.7 mg/dL (ref 2.5–4.6)

## 2021-11-19 LAB — GLUCOSE, CAPILLARY
Glucose-Capillary: 202 mg/dL — ABNORMAL HIGH (ref 70–99)
Glucose-Capillary: 241 mg/dL — ABNORMAL HIGH (ref 70–99)
Glucose-Capillary: 315 mg/dL — ABNORMAL HIGH (ref 70–99)

## 2021-11-19 LAB — HEMOGLOBIN A1C
Hgb A1c MFr Bld: 8.4 % — ABNORMAL HIGH (ref 4.8–5.6)
Mean Plasma Glucose: 194.38 mg/dL

## 2021-11-19 LAB — HEPATITIS B CORE ANTIBODY, TOTAL: Hep B Core Total Ab: NONREACTIVE

## 2021-11-19 LAB — ALBUMIN: Albumin: 2.7 g/dL — ABNORMAL LOW (ref 3.5–5.0)

## 2021-11-19 LAB — LACTIC ACID, PLASMA
Lactic Acid, Venous: 4.1 mmol/L (ref 0.5–1.9)
Lactic Acid, Venous: 3.2 mmol/L (ref 0.5–1.9)

## 2021-11-19 LAB — HEPATITIS C ANTIBODY: HCV Ab: NONREACTIVE

## 2021-11-19 LAB — BETA-HYDROXYBUTYRIC ACID: Beta-Hydroxybutyric Acid: 0.4 mmol/L — ABNORMAL HIGH (ref 0.05–0.27)

## 2021-11-19 LAB — HIV ANTIBODY (ROUTINE TESTING W REFLEX): HIV Screen 4th Generation wRfx: NONREACTIVE

## 2021-11-19 LAB — HEPATITIS B SURFACE ANTIBODY,QUALITATIVE: Hep B S Ab: REACTIVE — AB

## 2021-11-19 LAB — HEPATITIS B SURFACE ANTIGEN: Hepatitis B Surface Ag: NONREACTIVE

## 2021-11-19 MED ORDER — ASPIRIN 81 MG PO TBEC
81.0000 mg | DELAYED_RELEASE_TABLET | Freq: Every day | ORAL | Status: DC
  Administered 2021-11-20 – 2021-11-21 (×2): 81 mg via ORAL
  Filled 2021-11-19 (×3): qty 1

## 2021-11-19 MED ORDER — ONDANSETRON HCL 4 MG PO TABS: 4.0000 mg | ORAL_TABLET | Freq: Four times a day (QID) | ORAL | Status: DC | PRN

## 2021-11-19 MED ORDER — CALCITRIOL 0.5 MCG PO CAPS: 0.5000 ug | ORAL_CAPSULE | ORAL | Status: DC

## 2021-11-19 MED ORDER — MIDODRINE HCL 5 MG PO TABS: 10.0000 mg | ORAL_TABLET | Freq: Once | ORAL | Status: DC

## 2021-11-19 MED ORDER — DEXTROSE 50 % IV SOLN: 0.0000 mL | INTRAVENOUS | Status: DC | PRN

## 2021-11-19 MED ORDER — ALBUTEROL SULFATE (2.5 MG/3ML) 0.083% IN NEBU
2.5000 mg | INHALATION_SOLUTION | Freq: Four times a day (QID) | RESPIRATORY_TRACT | Status: DC | PRN
  Administered 2021-11-19: 2.5 mg via RESPIRATORY_TRACT
  Filled 2021-11-19: qty 3

## 2021-11-19 MED ORDER — ONDANSETRON HCL 4 MG/2ML IJ SOLN: 4.0000 mg | Freq: Four times a day (QID) | INTRAMUSCULAR | Status: DC | PRN

## 2021-11-19 MED ORDER — SODIUM CHLORIDE 0.9 % IV SOLN
500.0000 mg | INTRAVENOUS | Status: DC
  Administered 2021-11-20 (×2): 500 mg via INTRAVENOUS
  Filled 2021-11-19 (×4): qty 5

## 2021-11-19 MED ORDER — VANCOMYCIN HCL 1500 MG/300ML IV SOLN
1500.0000 mg | Freq: Once | INTRAVENOUS | Status: AC
  Administered 2021-11-19: 1500 mg via INTRAVENOUS
  Filled 2021-11-19 (×2): qty 300

## 2021-11-19 MED ORDER — MYCOPHENOLATE MOFETIL 250 MG PO CAPS
250.0000 mg | ORAL_CAPSULE | Freq: Every day | ORAL | Status: DC
  Administered 2021-11-20 – 2021-11-21 (×2): 250 mg via ORAL
  Filled 2021-11-19 (×3): qty 1

## 2021-11-19 MED ORDER — FUROSEMIDE 20 MG PO TABS: 20.0000 mg | ORAL_TABLET | Freq: Once | ORAL | Status: DC

## 2021-11-19 MED ORDER — LACTATED RINGERS IV BOLUS (SEPSIS)
250.0000 mL | Freq: Once | INTRAVENOUS | Status: AC
  Administered 2021-11-19: 250 mL via INTRAVENOUS

## 2021-11-19 MED ORDER — INSULIN REGULAR(HUMAN) IN NACL 100-0.9 UT/100ML-% IV SOLN
INTRAVENOUS | Status: DC
  Administered 2021-11-19: 9 [IU]/h via INTRAVENOUS
  Filled 2021-11-19: qty 100

## 2021-11-19 MED ORDER — INSULIN ASPART 100 UNIT/ML IJ SOLN
0.0000 [IU] | INTRAMUSCULAR | Status: DC
  Administered 2021-11-19: 2 [IU] via SUBCUTANEOUS
  Administered 2021-11-19 – 2021-11-20 (×2): 4 [IU] via SUBCUTANEOUS
  Administered 2021-11-20: 1 [IU] via SUBCUTANEOUS

## 2021-11-19 MED ORDER — LACTATED RINGERS IV BOLUS (SEPSIS)
1000.0000 mL | Freq: Once | INTRAVENOUS | Status: AC
  Administered 2021-11-19: 1000 mL via INTRAVENOUS

## 2021-11-19 MED ORDER — INSULIN ASPART 100 UNIT/ML IJ SOLN: 0.0000 [IU] | Freq: Three times a day (TID) | INTRAMUSCULAR | Status: DC

## 2021-11-19 MED ORDER — ACETAMINOPHEN 650 MG RE SUPP: 650.0000 mg | Freq: Four times a day (QID) | RECTAL | Status: DC | PRN

## 2021-11-19 MED ORDER — SEVELAMER CARBONATE 800 MG PO TABS
1600.0000 mg | ORAL_TABLET | Freq: Three times a day (TID) | ORAL | Status: DC
  Administered 2021-11-19 – 2021-11-21 (×5): 1600 mg via ORAL
  Filled 2021-11-19 (×6): qty 2

## 2021-11-19 MED ORDER — DEXTROSE IN LACTATED RINGERS 5 % IV SOLN: INTRAVENOUS | Status: DC

## 2021-11-19 MED ORDER — INSULIN REGULAR(HUMAN) IN NACL 100-0.9 UT/100ML-% IV SOLN
INTRAVENOUS | Status: DC
  Filled 2021-11-19: qty 100

## 2021-11-19 MED ORDER — LACTATED RINGERS IV SOLN: INTRAVENOUS | Status: DC

## 2021-11-19 MED ORDER — FUROSEMIDE 10 MG/ML IJ SOLN
20.0000 mg | Freq: Once | INTRAMUSCULAR | Status: AC
  Administered 2021-11-19: 20 mg via INTRAVENOUS
  Filled 2021-11-19: qty 2

## 2021-11-19 MED ORDER — GUAIFENESIN 100 MG/5ML PO LIQD
15.0000 mL | ORAL | Status: DC | PRN
  Administered 2021-11-19 (×2): 15 mL via ORAL
  Filled 2021-11-19 (×2): qty 15

## 2021-11-19 MED ORDER — SODIUM CHLORIDE 0.9 % IV SOLN
2.0000 g | Freq: Once | INTRAVENOUS | Status: AC
  Administered 2021-11-19: 2 g via INTRAVENOUS
  Filled 2021-11-19: qty 12.5

## 2021-11-19 MED ORDER — SODIUM CHLORIDE 0.9 % IV SOLN
125.0000 mg | INTRAVENOUS | Status: DC
  Administered 2021-11-20: 125 mg via INTRAVENOUS
  Filled 2021-11-19: qty 10

## 2021-11-19 MED ORDER — DOXERCALCIFEROL 4 MCG/2ML IV SOLN
8.0000 ug | INTRAVENOUS | Status: DC
Start: 2021-11-20 — End: 2021-11-21
  Administered 2021-11-20: 8 ug via INTRAVENOUS
  Filled 2021-11-19 (×2): qty 4

## 2021-11-19 MED ORDER — GABAPENTIN 300 MG PO CAPS
300.0000 mg | ORAL_CAPSULE | Freq: Every day | ORAL | Status: DC
Start: 2021-11-19 — End: 2021-11-21
  Administered 2021-11-19 – 2021-11-20 (×2): 300 mg via ORAL
  Filled 2021-11-19 (×2): qty 1

## 2021-11-19 MED ORDER — ENOXAPARIN SODIUM 30 MG/0.3ML IJ SOSY
30.0000 mg | PREFILLED_SYRINGE | Freq: Every day | INTRAMUSCULAR | Status: DC
  Administered 2021-11-20: 30 mg via SUBCUTANEOUS
  Filled 2021-11-19 (×2): qty 0.3

## 2021-11-19 MED ORDER — ALBUMIN HUMAN 25 % IV SOLN
25.0000 g | Freq: Once | INTRAVENOUS | Status: AC
  Administered 2021-11-19: 25 g via INTRAVENOUS

## 2021-11-19 MED ORDER — SODIUM CHLORIDE 0.9 % IV SOLN
500.0000 mg | Freq: Once | INTRAVENOUS | Status: AC
  Administered 2021-11-19: 500 mg via INTRAVENOUS
  Filled 2021-11-19: qty 5

## 2021-11-19 MED ORDER — PANCRELIPASE (LIP-PROT-AMYL) 36000-114000 UNITS PO CPEP: 36000.0000 [IU] | ORAL_CAPSULE | Freq: Every day | ORAL | Status: DC | PRN

## 2021-11-19 MED ORDER — INSULIN REGULAR(HUMAN) IN NACL 100-0.9 UT/100ML-% IV SOLN
INTRAVENOUS | Status: DC
  Administered 2021-11-19: 6.5 [IU]/h via INTRAVENOUS

## 2021-11-19 MED ORDER — ACETAMINOPHEN 325 MG PO TABS: 650.0000 mg | ORAL_TABLET | Freq: Four times a day (QID) | ORAL | Status: DC | PRN

## 2021-11-19 MED ORDER — ATORVASTATIN CALCIUM 10 MG PO TABS
10.0000 mg | ORAL_TABLET | Freq: Every day | ORAL | Status: DC
  Administered 2021-11-19 – 2021-11-20 (×2): 10 mg via ORAL
  Filled 2021-11-19 (×3): qty 1

## 2021-11-19 MED ORDER — CALCITRIOL 0.5 MCG PO CAPS
1.0000 ug | ORAL_CAPSULE | ORAL | Status: DC
  Administered 2021-11-20 (×2): 1 ug via ORAL
  Filled 2021-11-19 (×2): qty 2

## 2021-11-19 MED ORDER — SODIUM CHLORIDE 0.9 % IV SOLN
1.0000 g | Freq: Once | INTRAVENOUS | Status: AC
  Administered 2021-11-19: 1 g via INTRAVENOUS
  Filled 2021-11-19: qty 10

## 2021-11-19 MED ORDER — CALCITRIOL 0.5 MCG PO CAPS
0.5000 ug | ORAL_CAPSULE | ORAL | Status: DC
  Administered 2021-11-21: 0.5 ug via ORAL
  Filled 2021-11-19 (×3): qty 1

## 2021-11-19 MED ORDER — VANCOMYCIN HCL 750 MG/150ML IV SOLN
750.0000 mg | INTRAVENOUS | Status: DC
  Administered 2021-11-20: 750 mg via INTRAVENOUS
  Filled 2021-11-19: qty 150

## 2021-11-19 MED ORDER — LACTATED RINGERS IV BOLUS: 500.0000 mL | Freq: Once | INTRAVENOUS | Status: DC

## 2021-11-19 MED ORDER — CINACALCET HCL 30 MG PO TABS
30.0000 mg | ORAL_TABLET | ORAL | Status: DC
  Administered 2021-11-20: 30 mg via ORAL
  Filled 2021-11-19: qty 1

## 2021-11-19 MED ORDER — CHLORHEXIDINE GLUCONATE CLOTH 2 % EX PADS
6.0000 | MEDICATED_PAD | Freq: Every day | CUTANEOUS | Status: DC
  Administered 2021-11-20: 6 via TOPICAL

## 2021-11-19 MED ORDER — CHLORHEXIDINE GLUCONATE CLOTH 2 % EX PADS
6.0000 | MEDICATED_PAD | Freq: Every day | CUTANEOUS | Status: DC
  Administered 2021-11-21: 6 via TOPICAL

## 2021-11-19 MED ORDER — SODIUM CHLORIDE 0.9 % IV SOLN
1.0000 g | INTRAVENOUS | Status: DC
  Administered 2021-11-19 – 2021-11-20 (×2): 1 g via INTRAVENOUS
  Filled 2021-11-19 (×4): qty 10

## 2021-11-19 MED ORDER — INSULIN GLARGINE-YFGN 100 UNIT/ML ~~LOC~~ SOLN
6.0000 [IU] | Freq: Every day | SUBCUTANEOUS | Status: DC
  Filled 2021-11-19 (×2): qty 0.06

## 2021-11-19 MED ORDER — ALBUMIN HUMAN 25 % IV SOLN
INTRAVENOUS | Status: AC
  Filled 2021-11-19: qty 200

## 2021-11-19 NOTE — Progress Notes (Signed)
Received patient in bed to unit.  Alert and oriented.  Informed consent signed and in chart.   Treatment initiated: 0900 Treatment completed: 1330  Patient tolerated well.  Transported back to the room  Alert, without acute distress.  Hand-off given to patient's nurse.   Access used: AVF Access issues: none  Total UF removed: 3000 ml Medication(s) given: none Post HD VS: 103/60 p 84 R 18 Post HD weight: 63.7kg   [facility]

## 2021-11-19 NOTE — Assessment & Plan Note (Signed)
Pancreatic insufficiency 2002 Patient no longer on immunosuppressive medications Continue Creon

## 2021-11-19 NOTE — Procedures (Addendum)
   I was present at this dialysis session, have reviewed the session itself and made  appropriate changes Kelly Splinter MD [place] pager [phone]   11/19/2021, 2:37 PM

## 2021-11-19 NOTE — ED Notes (Signed)
MD and respiratory paged.

## 2021-11-19 NOTE — ED Notes (Signed)
Date and time results received: 11/19/21 0314 (use smartphrase ".now" to insert current time)  Test: lactic acid Critical Value: 4.1  Name of Provider Notified: [place]  Orders Received? Or Actions Taken?:  notified

## 2021-11-19 NOTE — Assessment & Plan Note (Signed)
Acute respiratory failure with hypoxia Severe sepsis Severe sepsis criteria includes fever, tachypnea, hypoxia, leukocytosis with lactic acidosis. Patient was hypoxic and tachypneic requiring O2 to maintain sats in the 90s Completed sepsis fluid bolus in the ED Given patient was recently on immunosuppressive medication now tapered will broaden antibiotic coverage Cefepime and vancomycin and azithromycin DuoNebs as needed Continue supplemental O2 and wean as tolerated

## 2021-11-19 NOTE — Progress Notes (Addendum)
[facility]                                                                              Eric Mccarty, is a 66 y.o. male, DOB - 1956/02/23, PZW:[id number] Admit date - 11/18/2021    Outpatient Primary MD for the patient is Corliss Parish, MD  LOS - 0  days  Chief Complaint  Patient presents with   Shortness of Breath       Brief summary   Patient is a 66 y.o. 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  Denied fevers or chest pain.   In ED, O2 sats down to 82%, placed on 2 L O2, no prior history of O2 use Initial glucose 611 with anion gap of 20, venous pH 7.49, BHB 0.4 COVID-19 negative.  CTA chest no PE but extensive bilateral groundglass airspace disease most compatible with pneumonia as well as bilateral pleural effusions.   Assessment & Plan    Principal Problem: Acute respiratory failure with hypoxia secondary to bilateral pneumonia and pulmonary edema in the setting of ESRD Severe sepsis -CT chest showed no PE, patient met sepsis criteria with fevers, tachypnea, hypoxia, leukocytosis, lactic acidosis, initially given sepsis fluid bolus in ED -Subsequently patient went into respiratory distress, received Lasix and was placed on BiPAP -Nephrology consulted for urgent hemodialysis.  Patient received albumin 25 g x 1 in a.m. -New supplemental O2, wean as tolerated, DuoNebs, continue IV cefepime, vancomycin, Zithromax -Continue BiPAP for  now  Active Problems:   Uncontrolled type 2 diabetes mellitus with hyperglycemia, with long-term current use of insulin (HCC) -CBGs now improving, will resume Lantus and placed on sliding scale insulin every 4 hours Recent Labs    11/19/21 0143 11/19/21 0400 11/19/21 0516 11/19/21 0542 11/19/21 0648 11/19/21 0826  GLUCAP 380* 323* 256* 247* 166* 107*   -Hemoglobin A1c 8.4     ESRD on dialysis MWF(HCC), renal transplant failure 2002-2022 -Nephrology consulted for hemodialysis     Pancreas transplant status [facility]), pancreatic insufficiency -Patient no longer on immunosuppressive medication per H&P, will verify with PTA med rec by pharmacy -Continue Creon   Code Status: Full code old DVT Prophylaxis:  enoxaparin (LOVENOX) injection 30 mg Start: 11/19/21 1000   Level of Care: Level of care: Progressive Family Communication: Updated patient's wife on phone. She requested amylase, lipase labs [facility] had requested as part of his transplant work-up) Disposition Plan:      Remains inpatient appropriate: Needs urgent hemodialysis   Procedures:  None  Consultants:   Renal   Antimicrobials:   Anti-infectives (From admission, onward)    Start     Dose/Rate Route Frequency Ordered Stop  11/20/21 1200  vancomycin (VANCOREADY) IVPB 750 mg/150 mL        750 mg 150 mL/hr over 60 Minutes Intravenous Every M-W-F (Hemodialysis) 11/19/21 0539     11/19/21 2200  azithromycin (ZITHROMAX) 500 mg in sodium chloride 0.9 % 250 mL IVPB        500 mg 250 mL/hr over 60 Minutes Intravenous Every 24 hours 11/19/21 0527 11/24/21 2159   11/19/21 2200  ceFEPIme (MAXIPIME) 1 g in sodium chloride 0.9 % 100 mL IVPB        1 g 200 mL/hr over 30 Minutes Intravenous Every 24 hours 11/19/21 0539     11/19/21 0545  vancomycin (VANCOREADY) IVPB 1500 mg/300 mL        1,500 mg 150 mL/hr over 120 Minutes Intravenous  Once 11/19/21 0539 11/19/21 0710   11/19/21 0545  ceFEPIme (MAXIPIME) 2 g in sodium  chloride 0.9 % 100 mL IVPB        2 g 200 mL/hr over 30 Minutes Intravenous  Once 11/19/21 0539 11/19/21 0627   11/19/21 0145  cefTRIAXone (ROCEPHIN) 1 g in sodium chloride 0.9 % 100 mL IVPB        1 g 200 mL/hr over 30 Minutes Intravenous  Once 11/19/21 0143 11/19/21 0315   11/19/21 0145  azithromycin (ZITHROMAX) 500 mg in sodium chloride 0.9 % 250 mL IVPB        500 mg 250 mL/hr over 60 Minutes Intravenous  Once 11/19/21 0143 11/19/21 0314          Medications  atorvastatin  10 mg Oral q1800   [START ON 11/20/2021] calcitRIOL  1 mcg Oral 2 times per day on Mon Wed Fri   And   calcitRIOL  0.5 mcg Oral Once per day on Sun Tue Thu Sat   Chlorhexidine Gluconate Cloth  6 each Topical Q0600   enoxaparin (LOVENOX) injection  30 mg Subcutaneous Daily   insulin aspart  0-6 Units Subcutaneous Q4H   insulin glargine-yfgn  6 Units Subcutaneous Daily   midodrine  10 mg Oral Once in dialysis      Subjective:   Eric Mccarty was seen and examined today.  On BiPAP, and oriented, no acute chest pain.  Low-grade temp 99 F.  Shortness of breath stable on BiPAP.  Patient denies dizziness, abdominal pain, N/V/D/C.  Objective:   Vitals:   11/19/21 0852 11/19/21 0907 11/19/21 0930 11/19/21 0945  BP:  (!) 85/61 97/66 113/66  Pulse:  84 84 84  Resp:  $Remo'20 15 18  'wEAOY$ Temp:   99 F (37.2 C)   TempSrc:   Axillary   SpO2: 98%  100% 100%   No intake or output data in the 24 hours ending 11/19/21 1029   Wt Readings from Last 3 Encounters:  06/30/21 69.7 kg  04/23/21 69.9 kg  03/12/21 72.1 kg     Exam General: Alert and oriented x 3, NAD, on BiPAP Cardiovascular: S1 S2 auscultated,  RRR Respiratory: Bilateral wheezing and bibasilar Rales Gastrointestinal: Soft, nontender, nondistended, + bowel sounds Ext: no pedal edema bilaterally Neuro: Strength 5/5 upper and lower extremities bilaterally Psych: Normal affect and demeanor, alert and oriented x3     Data Reviewed:  I have personally  reviewed following labs    CBC Lab Results  Component Value Date   WBC 13.7 (H) 11/19/2021   RBC 2.51 (L) 11/19/2021   HGB 8.0 (L) 11/19/2021   HCT 24.7 (L) 11/19/2021   MCV 98.4 11/19/2021   MCH  31.9 11/19/2021   PLT 204 11/19/2021   MCHC 32.4 11/19/2021   RDW 13.4 11/19/2021   LYMPHSABS 0.9 11/18/2021   MONOABS 1.0 11/18/2021   EOSABS 0.6 (H) 11/18/2021   BASOSABS 0.1 93/55/2174     Last metabolic panel Lab Results  Component Value Date   NA 134 (L) 11/19/2021   K 3.4 (L) 11/19/2021   CL 91 (L) 11/19/2021   CO2 25 11/19/2021   BUN 28 (H) 11/19/2021   CREATININE 5.05 (H) 11/19/2021   GLUCOSE 340 (H) 11/19/2021   GFRNONAA 12 (L) 11/19/2021   GFRAA 27 (L) 11/09/2018   CALCIUM 9.2 11/19/2021   PHOS 3.0 08/26/2017   PROT 5.5 (L) 02/02/2021   ALBUMIN 3.4 (L) 02/02/2021   BILITOT 0.7 02/02/2021   ALKPHOS 107 02/02/2021   AST 14 (L) 02/02/2021   ALT 15 02/02/2021   ANIONGAP 18 (H) 11/19/2021    CBG (last 3)  Recent Labs    11/19/21 0542 11/19/21 0648 11/19/21 0826  GLUCAP 247* 166* 107*      Coagulation Profile: No results for input(s): "INR", "PROTIME" in the last 168 hours.   Radiology Studies: I have personally reviewed the imaging studies  DG Chest Portable 1 View  Result Date: 11/19/2021  IMPRESSION: 1. The appearance the chest suggests worsening congestive heart failure, as above. The possibility of superimposed multilobar bilateral bronchopneumonia is not excluded. 2. Aortic atherosclerosis. Electronically Signed   By: Vinnie Langton M.D.   On: 11/19/2021 07:39   CT Angio Chest PE W and/or Wo Contrast  Result Date: 11/19/2021  IMPRESSION: No evidence of pulmonary embolus. Extensive bilateral ground-glass airspace disease most compatible with pneumonia. Small bilateral pleural effusions. Diffuse coronary artery disease. Aortic Atherosclerosis (ICD10-I70.0). Electronically Signed   By: Rolm Baptise M.D.   On: 11/19/2021 01:38   DG Chest 1  View  Result Date: 11/18/2021 IMPRESSION: Interval appearance of extensive interstitial and alveolar densities in both parahilar regions and both lower lung fields. Findings suggest pulmonary edema or extensive bilateral pneumonia. Small bilateral pleural effusions are seen. Electronically Signed   By: Elmer Picker M.D.   On: 11/18/2021 20:39       Jw Covin M.D. [facility] 11/19/2021, 10:29 AM  Available via Epic secure chat 7am-7pm After 7 pm, please refer to night coverage provider listed on amion.

## 2021-11-19 NOTE — ED Notes (Signed)
ED TO INPATIENT HANDOFF REPORT    S Name/Age/Gender [place] Mccarty 66 y.o. male Room/Bed: 008C/008C  Code Status   Code Status: Full Code  Home/SNF/Other Home Patient oriented to: self, place, time, and situation Is this baseline? Yes   Triage Complete: Triage complete  Chief Complaint Severe sepsis ([place]) [A41.9, R65.20]  Triage Note Pt c/o shortness of breath and a dry cough. Cough started x 3 weeks ago. Dialysis pt, MWF, denies missing any treatments, no new swelling.   Allergies No Known Allergies  Level of Care/Admitting Diagnosis ED Disposition     ED Disposition  Admit   Condition  --   Comment  Hospital Area: [place] [[id number]]  Level of Care: Progressive [102]  Admit to Progressive based on following criteria: MULTISYSTEM THREATS such as stable sepsis, metabolic/electrolyte imbalance with or without encephalopathy that is responding to early treatment.  Admit to Progressive based on following criteria: GI, ENDOCRINE disease patients with GI bleeding, acute liver failure or pancreatitis, stable with diabetic ketoacidosis or thyrotoxicosis (hypothyroid) state.  May admit patient to [facility] or [facility] if equivalent level of care is available:: No  Covid Evaluation: Asymptomatic - no recent exposure (last 10 days) testing not required  Diagnosis: Severe sepsis [facility]) [[id number]]  Admitting Physician: Athena Masse [[id number]]  Attending Physician: Athena Masse [[id number]]  Certification:: I certify this patient will need inpatient services for at least 2 midnights  Estimated Length of Stay: 3          B Medical/Surgery History Past Medical History:  Diagnosis Date   Anemia of chronic renal failure, unspecified CKD stage    Cancer ([place])    basal cell ca lt lower leg   Cataracts, bilateral    Diabetes mellitus without complication ([place])    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   Hyperlipidemia    Hyperparathyroidism  ([place])    Hypertension    Iron deficiency    Legally blind    Renal disorder 2005   kidney transplant at [facility]   Past Surgical History:  Procedure Laterality Date   APPLICATION OF WOUND VAC Left 04/20/2017   Procedure: APPLICATION OF WOUND VAC LEFT LOWER EXTREMITY;  Surgeon: Wallace Going, DO;  Location: [place];  Service: Plastics;  Laterality: Left;   AV FISTULA PLACEMENT     AV FISTULA PLACEMENT Right 03/12/2021   Procedure: RIGHT ARM FISTULA CREATION;  Surgeon: Broadus John, MD;  Location: [place];  Service: Vascular;  Laterality: Right;  PERIPHERAL NERVE BLOCK   COLONOSCOPY     COMBINED KIDNEY-PANCREAS TRANSPLANT     EMBOLECTOMY Left 01/15/2013   Procedure: EMBOLECTOMY Left Brachial Artery With Patch Angioplasty;  Surgeon: Elam Dutch, MD;  Location: [place];  Service: Vascular;  Laterality: Left;   EYE SURGERY     HERNIA REPAIR     KIDNEY TRANSPLANT     NEPHRECTOMY TRANSPLANTED ORGAN     SKIN SPLIT GRAFT Left 05/18/2017   Procedure: SKIN GRAFT SPLIT THICKNESS FROM LEFT UPPER LEG TO LEFT LOWER LEG WOUND;  Surgeon: Wallace Going, DO;  Location: [place];  Service: Plastics;  Laterality: Left;   UPPER EXTREMITY VENOGRAPHY Bilateral 03/11/2021   Procedure: UPPER EXTREMITY VENOGRAPHY;  Surgeon: Broadus John, MD;  Location: [place] CV LAB;  Service: Cardiovascular;  Laterality: Bilateral;   WOUND DEBRIDEMENT  05/18/2017   Procedure: DEBRIDEMENT LEFT LOWER LEG WOUND;  Surgeon: Wallace Going,  DO;  Location: [place];  Service: Plastics;;     A IV Location/Drains/Wounds Patient Lines/Drains/Airways Status     Active Line/Drains/Airways     Name Placement date Placement time Site Days   Peripheral IV 11/19/21 20 G Anterior;Distal;Left;Upper Arm 11/19/21  0056  Arm  less than 1   Peripheral IV 11/19/21 20 G Distal;Left;Posterior Forearm 11/19/21  0244  Forearm  less than 1   Fistula / Graft Right  Forearm Arteriovenous fistula 03/12/21  0835  Forearm  252   Incision (Closed) 05/18/17 Leg Left 05/18/17  0951  -- 1646   Incision (Closed) 05/18/17 Thigh Left 05/18/17  0951  -- 1646   Incision (Closed) 03/12/21 Arm Right 03/12/21  0849  -- 252            Intake/Output Last 24 hours No intake or output data in the 24 hours ending 11/19/21 1253  Labs/Imaging Results for orders placed or performed during the hospital encounter of 11/18/21 (from the past 48 hour(s))  CBC with Differential     Status: Abnormal   Collection Time: 11/18/21  8:00 PM  Result Value Ref Range   WBC 14.8 (H) 4.0 - 10.5 K/uL   RBC 3.06 (L) 4.22 - 5.81 MIL/uL   Hemoglobin 9.7 (L) 13.0 - 17.0 g/dL   HCT 29.8 (L) 39.0 - 52.0 %   MCV 97.4 80.0 - 100.0 fL   MCH 31.7 26.0 - 34.0 pg   MCHC 32.6 30.0 - 36.0 g/dL   RDW 13.4 11.5 - 15.5 %   Platelets 242 150 - 400 K/uL   nRBC 0.0 0.0 - 0.2 %   Neutrophils Relative % 81 %   Neutro Abs 12.0 (H) 1.7 - 7.7 K/uL   Lymphocytes Relative 6 %   Lymphs Abs 0.9 0.7 - 4.0 K/uL   Monocytes Relative 7 %   Monocytes Absolute 1.0 0.1 - 1.0 K/uL   Eosinophils Relative 4 %   Eosinophils Absolute 0.6 (H) 0.0 - 0.5 K/uL   Basophils Relative 1 %   Basophils Absolute 0.1 0.0 - 0.1 K/uL   Immature Granulocytes 1 %   Abs Immature Granulocytes 0.17 (H) 0.00 - 0.07 K/uL    Comment: Performed at [facility], [address] [place], [place] [address]  Brain natriuretic peptide     Status: Abnormal   Collection Time: 11/18/21  8:00 PM  Result Value Ref Range   B Natriuretic Peptide 2,788.5 (H) 0.0 - 100.0 pg/mL    Comment: Performed at [facility] [address], [place], [place] [address]  Basic metabolic panel     Status: Abnormal   Collection Time: 11/18/21  8:00 PM  Result Value Ref Range   Sodium 134 (L) 135 - 145 mmol/L   Potassium 3.9 3.5 - 5.1 mmol/L   Chloride 89 (L) 98 - 111 mmol/L   CO2 25 22 - 32 mmol/L   Glucose, Bld 611 (HH) 70 - 99 mg/dL     Comment: CRITICAL RESULT CALLED TO, READ BACK BY AND VERIFIED WITH Lesle Reek, RN, 2344 11/18/21, A. RAMSEY Glucose reference range applies only to samples taken after fasting for at least 8 hours.    BUN 23 8 - 23 mg/dL   Creatinine, Ser 4.37 (H) 0.61 - 1.24 mg/dL   Calcium 8.9 8.9 - 10.3 mg/dL   GFR, Estimated 14 (L) >60 mL/min    Comment: (NOTE) Calculated using the CKD-EPI Creatinine Equation (2021)    Anion  gap 20 (H) 5 - 15    Comment: Performed at [facility], [place] [address] [place], [place] [address]  SARS Coronavirus 2 by RT PCR (hospital order, performed in [facility] hospital lab) *cepheid single result test* Anterior Nasal Swab     Status: None   Collection Time: 11/18/21  8:00 PM   Specimen: Anterior Nasal Swab  Result Value Ref Range   SARS Coronavirus 2 by RT PCR NEGATIVE NEGATIVE    Comment: (NOTE) SARS-CoV-2 target nucleic acids are NOT DETECTED.  The SARS-CoV-2 RNA is generally detectable in upper and lower respiratory specimens during the acute phase of infection. The lowest concentration of SARS-CoV-2 viral copies this assay can detect is 250 copies / mL. A negative result does not preclude SARS-CoV-2 infection and should not be used as the sole basis for treatment or other patient management decisions.  A negative result may occur with improper specimen collection / handling, submission of specimen other than nasopharyngeal swab, presence of viral mutation(s) within the areas targeted by this assay, and inadequate number of viral copies (<250 copies / mL). A negative result must be combined with clinical observations, patient history, and epidemiological information.  Fact Sheet for Patients:   https://www.patel.info/  Fact Sheet for Healthcare Providers: https://hall.com/  This test is not yet approved or  cleared by the [place] FDA and has been authorized for detection and/or diagnosis of SARS-CoV-2  by FDA under an Emergency Use Authorization (EUA).  This EUA will remain in effect (meaning this test can be used) for the duration of the COVID-19 declaration under Section 564(b)(1) of the Act, 21 U.S.C. section 360bbb-3(b)(1), unless the authorization is terminated or revoked sooner.  Performed at [facility], [place] [address] [place], [place] [address]   CBG monitoring, ED     Status: Abnormal   Collection Time: 11/19/21 12:06 AM  Result Value Ref Range   Glucose-Capillary >600 (HH) 70 - 99 mg/dL    Comment: Glucose reference range applies only to samples taken after fasting for at least 8 hours.  I-Stat venous blood gas, [facility] ED only)     Status: Abnormal   Collection Time: 11/19/21 12:23 AM  Result Value Ref Range   pH, Ven 7.489 (H) 7.25 - 7.43   pCO2, Ven 36.8 (L) 44 - 60 mmHg   pO2, Ven 109 (H) 32 - 45 mmHg   Bicarbonate 28.0 20.0 - 28.0 mmol/L   TCO2 29 22 - 32 mmol/L   O2 Saturation 99 %   Acid-Base Excess 4.0 (H) 0.0 - 2.0 mmol/L   Sodium 129 (L) 135 - 145 mmol/L   Potassium 3.9 3.5 - 5.1 mmol/L   Calcium, Ion 0.96 (L) 1.15 - 1.40 mmol/L   HCT 30.0 (L) 39.0 - 52.0 %   Hemoglobin 10.2 (L) 13.0 - 17.0 g/dL   Sample type VENOUS   CBG monitoring, ED     Status: Abnormal   Collection Time: 11/19/21 12:45 AM  Result Value Ref Range   Glucose-Capillary 557 (HH) 70 - 99 mg/dL    Comment: Glucose reference range applies only to samples taken after fasting for at least 8 hours.   Comment 1 Notify RN   CBG monitoring, ED     Status: Abnormal   Collection Time: 11/19/21  1:43 AM  Result Value Ref Range   Glucose-Capillary 380 (H) 70 - 99 mg/dL    Comment: Glucose reference range applies only to samples taken after fasting for at  least 8 hours.  Lactic acid, plasma     Status: Abnormal   Collection Time: 11/19/21  2:24 AM  Result Value Ref Range   Lactic Acid, Venous 4.1 (HH) 0.5 - 1.9 mmol/L    Comment: CRITICAL RESULT CALLED TO, READ BACK BY AND VERIFIED WITH Lesle Reek, RN, [phone] 11/19/21, Courtney Paris Performed at [facility], [place] [address] [place], [place] [address]   Basic metabolic panel     Status: Abnormal   Collection Time: 11/19/21  2:42 AM  Result Value Ref Range   Sodium 134 (L) 135 - 145 mmol/L   Potassium 3.4 (L) 3.5 - 5.1 mmol/L   Chloride 91 (L) 98 - 111 mmol/L   CO2 25 22 - 32 mmol/L   Glucose, Bld 340 (H) 70 - 99 mg/dL    Comment: Glucose reference range applies only to samples taken after fasting for at least 8 hours.   BUN 28 (H) 8 - 23 mg/dL   Creatinine, Ser 5.05 (H) 0.61 - 1.24 mg/dL   Calcium 9.2 8.9 - 10.3 mg/dL   GFR, Estimated 12 (L) >60 mL/min    Comment: (NOTE) Calculated using the CKD-EPI Creatinine Equation (2021)    Anion gap 18 (H) 5 - 15    Comment: Performed at [facility] [address], [place], [place] [address]  Beta-hydroxybutyric acid     Status: Abnormal   Collection Time: 11/19/21  2:42 AM  Result Value Ref Range   Beta-Hydroxybutyric Acid 0.40 (H) 0.05 - 0.27 mmol/L    Comment: Performed at [facility] [address], [place], [place] [address]  CBG monitoring, ED     Status: Abnormal   Collection Time: 11/19/21  4:00 AM  Result Value Ref Range   Glucose-Capillary 323 (H) 70 - 99 mg/dL    Comment: Glucose reference range applies only to samples taken after fasting for at least 8 hours.  Lactic acid, plasma     Status: Abnormal   Collection Time: 11/19/21  5:10 AM  Result Value Ref Range   Lactic Acid, Venous 3.2 (HH) 0.5 - 1.9 mmol/L    Comment: CRITICAL VALUE NOTED.  VALUE IS CONSISTENT WITH PREVIOUSLY REPORTED AND CALLED VALUE. Performed at [facility], [place] [address] [place], [place] [address]   CBG monitoring, ED     Status: Abnormal   Collection Time: 11/19/21  5:16 AM  Result Value Ref Range   Glucose-Capillary 256 (H) 70 - 99 mg/dL    Comment: Glucose reference range applies only to samples taken after fasting for at least 8 hours.  HIV Antibody (routine  testing w rflx)     Status: None   Collection Time: 11/19/21  5:33 AM  Result Value Ref Range   HIV Screen 4th Generation wRfx Non Reactive Non Reactive    Comment: Performed at [facility], [place] [address] [place], [place] [address]  CBC     Status: Abnormal   Collection Time: 11/19/21  5:33 AM  Result Value Ref Range   WBC 13.7 (H) 4.0 - 10.5 K/uL   RBC 2.51 (L) 4.22 - 5.81 MIL/uL   Hemoglobin 8.0 (L) 13.0 - 17.0 g/dL   HCT 24.7 (L) 39.0 - 52.0 %   MCV 98.4 80.0 - 100.0 fL   MCH 31.9 26.0 - 34.0 pg   MCHC 32.4 30.0 - 36.0 g/dL   RDW 13.4 11.5 - 15.5 %   Platelets 204 150 -  400 K/uL   nRBC 0.0 0.0 - 0.2 %    Comment: Performed at [facility], [place] [address], [place], [place] [address]  Hemoglobin A1c     Status: Abnormal   Collection Time: 11/19/21  5:33 AM  Result Value Ref Range   Hgb A1c MFr Bld 8.4 (H) 4.8 - 5.6 %    Comment: (NOTE) Pre diabetes:          5.7%-6.4%  Diabetes:              >6.4%  Glycemic control for   <7.0% adults with diabetes    Mean Plasma Glucose 194.38 mg/dL    Comment: Performed at [facility] [address] [place], [place] [address]  CBG monitoring, ED     Status: Abnormal   Collection Time: 11/19/21  5:42 AM  Result Value Ref Range   Glucose-Capillary 247 (H) 70 - 99 mg/dL    Comment: Glucose reference range applies only to samples taken after fasting for at least 8 hours.  CBG monitoring, ED     Status: Abnormal   Collection Time: 11/19/21  6:48 AM  Result Value Ref Range   Glucose-Capillary 166 (H) 70 - 99 mg/dL    Comment: Glucose reference range applies only to samples taken after fasting for at least 8 hours.  CBG monitoring, ED     Status: Abnormal   Collection Time: 11/19/21  8:26 AM  Result Value Ref Range   Glucose-Capillary 107 (H) 70 - 99 mg/dL    Comment: Glucose reference range applies only to samples taken after fasting for at least 8 hours.   Comment 1 Notify RN    Comment 2 Document in Chart    Hepatitis B surface antigen     Status: None   Collection Time: 11/19/21  8:46 AM  Result Value Ref Range   Hepatitis B Surface Ag NON REACTIVE NON REACTIVE    Comment: Performed at [facility] [address] [place], [place] [address]  Hepatitis B surface antibody     Status: Abnormal   Collection Time: 11/19/21  8:46 AM  Result Value Ref Range   Hep B S Ab Reactive (A) NON REACTIVE    Comment: (NOTE) Consistent with immunity, greater than 9.9 mIU/mL.  Performed at [facility], [place] [address], [place], [place] [address]   Hepatitis B core antibody, total     Status: None   Collection Time: 11/19/21  8:46 AM  Result Value Ref Range   Hep B Core Total Ab NON REACTIVE NON REACTIVE    Comment: Performed at [facility] [address], [place], [place] [address]  Hepatitis C antibody     Status: None   Collection Time: 11/19/21  8:46 AM  Result Value Ref Range   HCV Ab NON REACTIVE NON REACTIVE    Comment: (NOTE) Nonreactive HCV antibody screen is consistent with no HCV infections,  unless recent infection is suspected or other evidence exists to indicate HCV infection.  Performed at [facility], [place] [address], [place], [place] [address]    DG Chest Portable 1 View  Result Date: 11/19/2021 CLINICAL DATA:  66 year old male with history of possible fluid in the lungs. EXAM: PORTABLE CHEST 1 VIEW COMPARISON:  Chest x-ray 11/18/2021. FINDINGS: There is marked cephalization of the pulmonary vasculature, severe indistinctness of the interstitial markings, and extensive multifocal airspace disease throughout the lungs bilaterally suggestive of severe pulmonary  edema. Small bilateral pleural effusions. No pneumothorax. Heart size is mildly enlarged. Upper mediastinal contours are within normal limits. Atherosclerotic calcifications in the thoracic aorta. IMPRESSION: 1. The appearance the chest suggests worsening congestive heart failure, as above. The  possibility of superimposed multilobar bilateral bronchopneumonia is not excluded. 2. Aortic atherosclerosis. Electronically Signed   By: Vinnie Langton M.D.   On: 11/19/2021 07:39   CT Angio Chest PE W and/or Wo Contrast  Result Date: 11/19/2021 CLINICAL DATA:  Pulmonary embolism (PE) suspected, high prob. Shortness of breath, cough EXAM: CT ANGIOGRAPHY CHEST WITH CONTRAST TECHNIQUE: Multidetector CT imaging of the chest was performed using the standard protocol during bolus administration of intravenous contrast. Multiplanar CT image reconstructions and MIPs were obtained to evaluate the vascular anatomy. RADIATION DOSE REDUCTION: This exam was performed according to the departmental dose-optimization program which includes automated exposure control, adjustment of the mA and/or kV according to patient size and/or use of iterative reconstruction technique. CONTRAST:  56m OMNIPAQUE IOHEXOL 350 MG/ML SOLN COMPARISON:  None Available. FINDINGS: Cardiovascular: No filling defects in the pulmonary arteries to suggest pulmonary emboli. Heart upper limits normal in size. Diffuse coronary artery and scattered aortic calcifications. No aneurysm. Mediastinum/Nodes: Borderline sized mediastinal and bilateral hilar lymph nodes. No axillary adenopathy. Lungs/Pleura: Extensive bilateral airspace disease most compatible with pneumonia. Small bilateral pleural effusions. Upper Abdomen: No acute findings Musculoskeletal: Chest wall soft tissues are unremarkable. No acute bony abnormality. Review of the MIP images confirms the above findings. IMPRESSION: No evidence of pulmonary embolus. Extensive bilateral ground-glass airspace disease most compatible with pneumonia. Small bilateral pleural effusions. Diffuse coronary artery disease. Aortic Atherosclerosis (ICD10-I70.0). Electronically Signed   By: KRolm BaptiseM.D.   On: 11/19/2021 01:38   DG Chest 1 View  Result Date: 11/18/2021 CLINICAL DATA:  Shortness of breath,  cough EXAM: CHEST  1 VIEW COMPARISON:  07/13/2009 FINDINGS: Transverse diameter of heart is increased. Central pulmonary vessels are prominent. Extensive patchy interstitial and alveolar densities are seen in both lungs. There is blunting of both lateral CP angles. There is no pneumothorax. IMPRESSION: Interval appearance of extensive interstitial and alveolar densities in both parahilar regions and both lower lung fields. Findings suggest pulmonary edema or extensive bilateral pneumonia. Small bilateral pleural effusions are seen. Electronically Signed   By: PElmer PickerM.D.   On: 11/18/2021 20:39    Pending Labs Unresulted Labs (From admission, onward)     Start     Ordered   11/26/21 0500  Creatinine, serum  (enoxaparin (LOVENOX)    CrCl >/= 30 ml/min)  Weekly,   R     Comments: while on enoxaparin therapy    11/19/21 0527   11/20/21 0500  Protime-INR  Tomorrow morning,   R        11/19/21 0527   11/20/21 0500  Cortisol-am, blood  Tomorrow morning,   R        11/19/21 0527   11/20/21 0500  Procalcitonin  Tomorrow morning,   R        11/19/21 0527   11/20/21 01517 Basic metabolic panel  (Hyperglycemia (not DKA or HHS))  Daily at 5am,   R      11/19/21 0527   11/19/21 0823  Hepatitis B surface antibody,quantitative  (New Admission Hemo Labs (Hepatitis B))  Once,   R        11/19/21 0825   11/19/21 0205  Blood culture (routine x 2)  BLOOD CULTURE X 2,   R  11/19/21 0205            Vitals/Pain Today's Vitals   11/19/21 1145 11/19/21 1200 11/19/21 1215 11/19/21 1230  BP: 119/62 110/60 (!) 113/55 109/61  Pulse: 78 78 80 78  Resp: '18 18 18 18  '$ Temp:      TempSrc:      SpO2: 100% 100% 100% 100%  Weight:      PainSc:        Isolation Precautions Airborne and Contact precautions  Medications Medications  dextrose 5 % in lactated ringers infusion (0 mLs Intravenous Stopped 11/19/21 0651)  dextrose 50 % solution 0-50 mL (has no administration in time range)   atorvastatin (LIPITOR) tablet 10 mg (has no administration in time range)  lipase/protease/amylase (CREON) capsule 36,000 Units (has no administration in time range)  enoxaparin (LOVENOX) injection 30 mg (has no administration in time range)  azithromycin (ZITHROMAX) 500 mg in sodium chloride 0.9 % 250 mL IVPB (has no administration in time range)  acetaminophen (TYLENOL) tablet 650 mg (has no administration in time range)    Or  acetaminophen (TYLENOL) suppository 650 mg (has no administration in time range)  ondansetron (ZOFRAN) tablet 4 mg (has no administration in time range)    Or  ondansetron (ZOFRAN) injection 4 mg (has no administration in time range)  lactated ringers infusion ( Intravenous Not Given 11/19/21 0543)  dextrose 5 % in lactated ringers infusion (0 mLs Intravenous Hold 11/19/21 0537)  dextrose 50 % solution 0-50 mL (has no administration in time range)  calcitRIOL (ROCALTROL) capsule 1 mcg (has no administration in time range)    And  calcitRIOL (ROCALTROL) capsule 0.5 mcg (0.5 mcg Oral Not Given 11/19/21 0722)  ceFEPIme (MAXIPIME) 1 g in sodium chloride 0.9 % 100 mL IVPB (has no administration in time range)  vancomycin (VANCOREADY) IVPB 750 mg/150 mL (has no administration in time range)  guaiFENesin (ROBITUSSIN) 100 MG/5ML liquid 15 mL (15 mLs Oral Given 11/19/21 0634)  albuterol (PROVENTIL) (2.5 MG/3ML) 0.083% nebulizer solution 2.5 mg (2.5 mg Nebulization Given 11/19/21 0625)  insulin glargine-yfgn (SEMGLEE) injection 6 Units (has no administration in time range)  insulin aspart (novoLOG) injection 0-6 Units ( Subcutaneous Not Given 11/19/21 0838)  Chlorhexidine Gluconate Cloth 2 % PADS 6 each (has no administration in time range)  albumin human 25 % solution (0 g  Hold 11/19/21 0924)  midodrine (PROAMATINE) tablet 10 mg (has no administration in time range)  mycophenolate (CELLCEPT) capsule 250 mg (has no administration in time range)  sevelamer carbonate (RENVELA)  tablet 1,600 mg (has no administration in time range)  gabapentin (NEURONTIN) capsule 300 mg (has no administration in time range)  aspirin EC tablet 81 mg (has no administration in time range)  insulin aspart (novoLOG) injection 20 Units (20 Units Intravenous Given 11/19/21 0012)  iohexol (OMNIPAQUE) 350 MG/ML injection 100 mL (75 mLs Intravenous Contrast Given 11/19/21 0133)  cefTRIAXone (ROCEPHIN) 1 g in sodium chloride 0.9 % 100 mL IVPB (0 g Intravenous Stopped 11/19/21 0315)  azithromycin (ZITHROMAX) 500 mg in sodium chloride 0.9 % 250 mL IVPB (0 mg Intravenous Stopped 11/19/21 0314)  lactated ringers bolus 1,000 mL (0 mLs Intravenous Stopped 11/19/21 0401)    And  lactated ringers bolus 1,000 mL (0 mLs Intravenous Stopped 11/19/21 0456)    And  lactated ringers bolus 250 mL (0 mLs Intravenous Stopped 11/19/21 0401)  vancomycin (VANCOREADY) IVPB 1500 mg/300 mL (0 mg Intravenous Stopped 11/19/21 0710)  ceFEPIme (MAXIPIME) 2 g in sodium chloride  0.9 % 100 mL IVPB (0 g Intravenous Stopped 11/19/21 0627)  furosemide (LASIX) injection 20 mg (20 mg Intravenous Given 11/19/21 0658)  albumin human 25 % solution 25 g (25 g Intravenous New Bag/Given 11/19/21 0930)    Mobility walks with device Low fall risk   Focused Assessments Pulmonary Assessment Handoff:  Lung sounds: Bilateral Breath Sounds: Clear, Diminished O2 Device: Bi-PAP O2 Flow Rate (L/min): 2 L/min    R Recommendations: See Admitting Provider Note

## 2021-11-19 NOTE — Progress Notes (Signed)
       Event NOTE  NAME: [place] Mccarty MRN: [id number] DOB : 1956/02/16    Date of Service   11/19/21  HPI/Events of Note   "increased work of breathing and is now on 6L [place]. he states he feels like he cant breathe..    11/19/2021    6:00 AM 11/19/2021    5:45 AM 11/19/2021    5:00 AM  Vitals with BMI  Systolic 117 356 701  Diastolic 62 60 58  Pulse 92 88 88     Assessment and  Interventions   Physical Exam Vitals and nursing note reviewed.  Constitutional:      Interventions: Nasal cannula in place.     Comments: Patient in acute respiratory distress, sitting upright in bed.  Able to talk in complete sentences but using accessory muscles  Pulmonary:     Effort: Tachypnea and accessory muscle usage present.     Breath sounds: Rales present.  Neurological:     Mental Status: He is alert.     Assessment: Acute worsening of acute respiratory failure secondary to fluid overload in the management of severe sepsis and hyperglycemia  Plan: Respiratory in room.  BiPAP ordered to assist with respiratory distress Chest x-ray ordered Lasix x1 dose as BP now 148/83 on monitor Fluids discontinued Spoke with nephrologist, Dr. Zenda Alpers to arrange for urgent dialysis Signing out to incoming physician

## 2021-11-19 NOTE — Progress Notes (Signed)
Pharmacy Antibiotic Note  ART LEVAN is a 66 y.o. male admitted on 11/18/2021 with pneumonia.  Pharmacy has been consulted for Vancomycin/Cefepime dosing. WBC elevated. ESRD on HD MWF.  Plan: Vancomycin 1500 mg IV x 1, then 750 mg IV qHD MWF Cefepime 1g IV q24h Trend WBC, temp, HD schedule  F/U infectious work-up Drug levels as indicated   Temp (24hrs), Avg:98.7 F (37.1 C), Min:97.8 F (36.6 C), Max:99.6 F (37.6 C)  Recent Labs  Lab 11/18/21 2000 11/19/21 0224 11/19/21 0242  WBC 14.8*  --   --   CREATININE 4.37*  --  5.05*  LATICACIDVEN  --  4.1*  --     CrCl cannot be calculated (Unknown ideal weight.).    No Known Allergies  Narda Bonds, PharmD, BCPS Clinical Pharmacist Phone: [phone]

## 2021-11-19 NOTE — ED Notes (Signed)
Pt having increased work of breathing. Pt wheezing. Pt's O2 at 86% on 4L [place]. Pt moved to 6L [place].

## 2021-11-19 NOTE — ED Notes (Signed)
RT at bedside setting pt up on bipap.

## 2021-11-19 NOTE — H&P (Signed)
History and Physical    Patient: Eric Mccarty ZWC:[id number] DOB: 1956/01/04 DOA: 11/18/2021 DOS: the patient was seen and examined on 11/19/2021 PCP: Corliss Parish, MD  Patient coming from: Home  Chief Complaint:  Chief Complaint  Patient presents with   Shortness of Breath    HPI: Eric Mccarty is a 66 y.o. 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  He denies fevers and denies affected contacts.  Denies chest pain.  He presented by EMS recorded O2 sat of 89% on room air at he has no prior history of O2 use. ED course and data review: Tmax 99.6 with pulse in the 80s respirations up to 23, O2 sats dipping as low as 82% requiring O2 at 2 L to maintain sats in the mid 90s.  Blood pressure initially 120/57 softening to 99/57, but fluid responsive to 107/58. Labs: WBC 14,000 with lactic acid 4.1 hemoglobin 9.7.  Initial glucose was 611 with anion gap of 20 but with venous pH 7.49 and beta hydroxybutyric acid of 0.4.  Hemoglobin 9.7.  COVID-negative.  Urinalysis and procalcitonin not done. CTA chest showed no PE but showed extensive bilateral groundglass airspace disease most compatible with pneumonia as well as small bilateral pleural effusions.  Patient started on sepsis fluids, antibiotics and an insulin infusion and hospitalist consulted for admission.   Review of Systems: As mentioned in the history of present illness. All other systems reviewed and are  negative.  Past Medical History:  Diagnosis Date   Anemia of chronic renal failure, unspecified CKD stage    Cancer (HCC)    basal cell ca lt lower leg   Cataracts, bilateral    Diabetes mellitus without complication ([place])    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   Hyperlipidemia    Hyperparathyroidism ([place])    Hypertension    Iron deficiency    Legally blind    Renal disorder 2005   kidney transplant at [facility]   Past Surgical History:  Procedure Laterality Date   APPLICATION OF WOUND VAC Left 04/20/2017   Procedure: APPLICATION OF WOUND VAC LEFT LOWER EXTREMITY;  Surgeon: Wallace Going, DO;  Location: [place];  Service: Plastics;  Laterality: Left;   AV FISTULA PLACEMENT     AV FISTULA PLACEMENT Right 03/12/2021   Procedure: RIGHT ARM FISTULA CREATION;  Surgeon: Broadus John, MD;  Location: [place];  Service: Vascular;  Laterality: Right;  PERIPHERAL NERVE BLOCK   COLONOSCOPY     COMBINED KIDNEY-PANCREAS TRANSPLANT     EMBOLECTOMY Left 01/15/2013   Procedure: EMBOLECTOMY Left Brachial Artery With Patch Angioplasty;  Surgeon: Elam Dutch, MD;  Location: [place];  Service: Vascular;  Laterality: Left;   EYE SURGERY     HERNIA REPAIR     KIDNEY TRANSPLANT     NEPHRECTOMY TRANSPLANTED ORGAN     SKIN SPLIT GRAFT Left 05/18/2017   Procedure: SKIN GRAFT SPLIT THICKNESS FROM LEFT UPPER LEG TO LEFT LOWER LEG WOUND;  Surgeon: Audelia Hives  S, DO;  Location[place];  Service: Plastics;  Laterality: Left;   UPPER EXTREMITY VENOGRAPHY Bilateral 03/11/2021   Procedure: UPPER EXTREMITY VENOGRAPHY;  Surgeon: Broadus John, MD;  Location: [place] [facility];  Service: Cardiovascular;  Laterality: Bilateral;   WOUND DEBRIDEMENT  05/18/2017   Procedure: DEBRIDEMENT LEFT LOWER LEG WOUND;  Surgeon: Wallace Going, DO;  Location: [place];  Service: Plastics;;   Social History:  reports that he has never  smoked. He has never used smokeless tobacco. He reports that he does not drink alcohol and does not use drugs.  No Known Allergies  Family History  Problem Relation Age of Onset   Diabetes Mother    Hypertension Mother    Hypertension Father    Alzheimer's disease Father    Kidney disease Father     Prior to Admission medications   Medication Sig Start Date End Date Taking? Authorizing Provider  amitriptyline (ELAVIL) 25 MG tablet Take 25 mg by mouth at bedtime.    [provider]  amLODipine (NORVASC) 10 MG tablet Take 5 mg by mouth at bedtime. Per patient dose change from 10 mg to 5 mg po at bedtime 08/07/17   [provider]  aspirin 81 MG tablet Take 81 mg by mouth daily.    [provider]  atorvastatin (LIPITOR) 10 MG tablet Take 10 mg by mouth daily at 6 PM.     [provider]  calcitRIOL (ROCALTROL) 0.5 MCG capsule Take 0.5-1 mcg by mouth See admin instructions. Take 1 mg on Monday, Wednesday and Friday morning and evening Take 0.5 all the other days in the Morning only 01/07/21   [provider]  Cyanocobalamin (VITAMIN B-12 PO) Take by mouth. Patient not taking: Reported on 06/30/2021    [provider]  dorzolamide-timolol (COSOPT) 22.3-6.8 MG/ML ophthalmic solution Place 1 drop into the left eye 2 (two) times daily. 01/14/21   [provider]  furosemide (LASIX) 40 MG tablet Take 80 mg by mouth daily. 01/11/21   [provider]  gabapentin (NEURONTIN) 600 MG tablet Take 300 mg by mouth at bedtime. Per patient dose change from '600mg'$  to '300mg'$  taken at bedtime    [provider]  glipiZIDE (GLUCOTROL) 10 MG tablet Take 10 mg by mouth daily. 12/13/20   [provider]  hydrALAZINE (APRESOLINE) 25 MG tablet Take 25 mg by mouth 2 (two) times daily. 07/26/17   [provider]  insulin glargine (LANTUS) 100 UNIT/ML injection Inject 6 Units into the skin at bedtime.    [provider]  labetalol (NORMODYNE) 200 MG tablet Take 200-400 mg by mouth See admin instructions. Take 200 mg  tablet in the morning and take 400 mg in the evening    [provider]  lipase/protease/amylase (CREON) 36000 UNITS CPEP capsule Take 1 capsule by mouth daily as needed (Stomach problem). 07/01/20   [provider]  MAGNESIUM GLUCONATE PO Take 1,200 mg by mouth 2 (two) times daily. Patient not taking: Reported on 06/30/2021    [provider]  mycophenolate (CELLCEPT) 250 MG capsule Take 250 mg by mouth 2 (two) times daily. 01/21/21   [provider]  oxyCODONE-acetaminophen (PERCOCET) 5-325 MG tablet Take 1 tablet by mouth every 6 (six) hours as needed for severe pain. 03/12/21 03/12/22  Baglia, Corrina, PA-C  pioglitazone (ACTOS) 45 MG tablet Take 45 mg by mouth daily. 01/02/21   [provider]  prednisoLONE acetate (PRED FORTE)  1 % ophthalmic suspension Place 1 drop into the left eye 2 (two) times daily. 01/27/21   [provider]  sorbitol 70 % solution Take 15 mLs by mouth daily as needed (constipation).     [provider]  tacrolimus (PROGRAF) 1 MG capsule Take 1 mg by mouth 2 (two) times daily.    [provider]    Physical Exam: Vitals:   11/19/21 0345 11/19/21 0430 11/19/21 0445 11/19/21 0500  BP: (!) 99/57 99/60 101/60 (!) 107/58  Pulse: 81 82 84 88  Resp: (!) '23 16 19 19  '$ Temp:      TempSrc:      SpO2: 92%  92%    Physical Exam Vitals and nursing note reviewed.  Constitutional:      General: He is not in acute distress.    Appearance: He is ill-appearing.  HENT:     Head: Normocephalic and atraumatic.  Cardiovascular:     Rate and Rhythm: Normal rate and regular rhythm.     Heart sounds: Normal heart sounds.  Pulmonary:     Effort: Tachypnea present.     Breath sounds: Wheezing and rales present.  Abdominal:     Palpations: Abdomen is soft.     Tenderness: There is no abdominal tenderness.   Neurological:     Mental Status: Mental status is at baseline.     Labs on Admission: I have personally reviewed following labs and imaging studies  CBC: Recent Labs  Lab 11/18/21 2000 11/19/21 0023  WBC 14.8*  --   NEUTROABS 12.0*  --   HGB 9.7* 10.2*  HCT 29.8* 30.0*  MCV 97.4  --   PLT 242  --    Basic Metabolic Panel: Recent Labs  Lab 11/18/21 2000 11/19/21 0023 11/19/21 0242  NA 134* 129* 134*  K 3.9 3.9 3.4*  CL 89*  --  91*  CO2 25  --  25  GLUCOSE 611*  --  340*  BUN 23  --  28*  CREATININE 4.37*  --  5.05*  CALCIUM 8.9  --  9.2   GFR: CrCl cannot be calculated (Unknown ideal weight.). Liver Function Tests: No results for input(s): "AST", "ALT", "ALKPHOS", "BILITOT", "PROT", "ALBUMIN" in the last 168 hours. No results for input(s): "LIPASE", "AMYLASE" in the last 168 hours. No results for input(s): "AMMONIA" in the last 168 hours. Coagulation Profile: No results for input(s): "INR", "PROTIME" in the last 168 hours. Cardiac Enzymes: No results for input(s): "CKTOTAL", "CKMB", "CKMBINDEX", "TROPONINI" in the last 168 hours. BNP (last 3 results) No results for input(s): "PROBNP" in the last 8760 hours. HbA1C: No results for input(s): "HGBA1C" in the last 72 hours. CBG: Recent Labs  Lab 11/19/21 0006 11/19/21 0045 11/19/21 0143 11/19/21 0400 11/19/21 0516  GLUCAP >600* 557* 380* 323* 256*   Lipid Profile: No results for input(s): "CHOL", "HDL", "LDLCALC", "TRIG", "CHOLHDL", "LDLDIRECT" in the last 72 hours. Thyroid Function Tests: No results for input(s): "TSH", "T4TOTAL", "FREET4", "T3FREE", "THYROIDAB" in the last 72 hours. Anemia Panel: No results for input(s): "VITAMINB12", "FOLATE", "FERRITIN", "TIBC", "IRON", "RETICCTPCT" in the last 72 hours. Urine analysis:    Component Value Date/Time   COLORURINE YELLOW 02/02/2021 1610   APPEARANCEUR CLEAR 02/02/2021 1610   LABSPEC 1.010 02/02/2021 1610   PHURINE 6.0 02/02/2021 1610   GLUCOSEU 50  (A) 02/02/2021 1610   HGBUR SMALL (A) 02/02/2021 1610   BILIRUBINUR NEGATIVE 02/02/2021 1610   KETONESUR NEGATIVE 02/02/2021 1610   PROTEINUR 100 (A)  02/02/2021 1610   UROBILINOGEN 0.2 07/13/2009 1851   NITRITE NEGATIVE 02/02/2021 1610   LEUKOCYTESUR NEGATIVE 02/02/2021 1610    Radiological Exams on Admission: CT Angio Chest PE W and/or Wo Contrast  Result Date: 11/19/2021 CLINICAL DATA:  Pulmonary embolism (PE) suspected, high prob. Shortness of breath, cough EXAM: CT ANGIOGRAPHY CHEST WITH CONTRAST TECHNIQUE: Multidetector CT imaging of the chest was performed using the standard protocol during bolus administration of intravenous contrast. Multiplanar CT image reconstructions and MIPs were obtained to evaluate the vascular anatomy. RADIATION DOSE REDUCTION: This exam was performed according to the departmental dose-optimization program which includes automated exposure control, adjustment of the mA and/or kV according to patient size and/or use of iterative reconstruction technique. CONTRAST:  70m OMNIPAQUE IOHEXOL 350 MG/ML SOLN COMPARISON:  None Available. FINDINGS: Cardiovascular: No filling defects in the pulmonary arteries to suggest pulmonary emboli. Heart upper limits normal in size. Diffuse coronary artery and scattered aortic calcifications. No aneurysm. Mediastinum/Nodes: Borderline sized mediastinal and bilateral hilar lymph nodes. No axillary adenopathy. Lungs/Pleura: Extensive bilateral airspace disease most compatible with pneumonia. Small bilateral pleural effusions. Upper Abdomen: No acute findings Musculoskeletal: Chest wall soft tissues are unremarkable. No acute bony abnormality. Review of the MIP images confirms the above findings. IMPRESSION: No evidence of pulmonary embolus. Extensive bilateral ground-glass airspace disease most compatible with pneumonia. Small bilateral pleural effusions. Diffuse coronary artery disease. Aortic Atherosclerosis (ICD10-I70.0). Electronically  Signed   By: KRolm BaptiseM.D.   On: 11/19/2021 01:38   DG Chest 1 View  Result Date: 11/18/2021 CLINICAL DATA:  Shortness of breath, cough EXAM: CHEST  1 VIEW COMPARISON:  07/13/2009 FINDINGS: Transverse diameter of heart is increased. Central pulmonary vessels are prominent. Extensive patchy interstitial and alveolar densities are seen in both lungs. There is blunting of both lateral CP angles. There is no pneumothorax. IMPRESSION: Interval appearance of extensive interstitial and alveolar densities in both parahilar regions and both lower lung fields. Findings suggest pulmonary edema or extensive bilateral pneumonia. Small bilateral pleural effusions are seen. Electronically Signed   By: PElmer PickerM.D.   On: 11/18/2021 20:39     Data Reviewed: Relevant notes from primary care and specialist visits, past discharge summaries as available in EHR, including Care Everywhere. Prior diagnostic testing as pertinent to current admission diagnoses Updated medications and problem lists for reconciliation ED course, including vitals, labs, imaging, treatment and response to treatment Triage notes, nursing and pharmacy notes and ED provider's notes Notable results as noted in HPI   Assessment and Plan: * Bilateral pneumonia Acute respiratory failure with hypoxia Severe sepsis Severe sepsis criteria includes fever, tachypnea, hypoxia, leukocytosis with lactic acidosis. Patient was hypoxic and tachypneic requiring O2 to maintain sats in the 90s Completed sepsis fluid bolus in the ED Given patient was recently on immunosuppressive medication now tapered will broaden antibiotic coverage Cefepime and vancomycin and azithromycin DuoNebs as needed Continue supplemental O2 and wean as tolerated    Uncontrolled type 2 diabetes mellitus with hyperglycemia, with long-term current use of insulin (HCC) Past documentation of type I versus type II on chart review Currently on glipizide and insulin.   Had a C-peptide done by transplant doctors on 11/10/2021 that was 7 Elevated anion gap but normal beta hydroxy under 3 and not acidotic Will treat with insulin infusion given severity of current illness, dialysis status until blood pressure and transition to subcu 1 blood sugars more under control Follow A1c  ESRD on dialysis MWF(HCC) Renal transplant failure (([id number] Nephrology consult for  continuation of dialysis  Pancreas transplant status [facility]) Pancreatic insufficiency 2002 Patient no longer on immunosuppressive medications Continue Creon        DVT prophylaxis: Lovenox  Consults: none  Advance Care Planning:   Code Status: Full Code   Family Communication: Wife at bedside  Disposition Plan: Back to previous home environment  Severity of Illness: The appropriate patient status for this patient is INPATIENT. Inpatient status is judged to be reasonable and necessary in order to provide the required intensity of service to ensure the patient's safety. The patient's presenting symptoms, physical exam findings, and initial radiographic and laboratory data in the context of their chronic comorbidities is felt to place them at high risk for further clinical deterioration. Furthermore, it is not anticipated that the patient will be medically stable for discharge from the hospital within 2 midnights of admission.   * I certify that at the point of admission it is my clinical judgment that the patient will require inpatient hospital care spanning beyond 2 midnights from the point of admission due to high intensity of service, high risk for further deterioration and high frequency of surveillance required.*  Author: Athena Masse, MD 11/19/2021 5:27 AM  For on call review [email or web address].

## 2021-11-19 NOTE — Progress Notes (Signed)
Sepsis tracking by eLINK 

## 2021-11-19 NOTE — Assessment & Plan Note (Signed)
Renal transplant failure (2002-2022) Nephrology consult for continuation of dialysis

## 2021-11-19 NOTE — ED Notes (Signed)
Pt on bipap sating 100%. Pt in no acute distress.

## 2021-11-19 NOTE — ED Notes (Signed)
Spoke to MD on phone. MD stated to order chest x ray. MD stated she will come assess pt. Pt 92% on 6L [place].

## 2021-11-19 NOTE — Progress Notes (Signed)
Pt. Arrived to unit alert and oriented X4. No c/o pain, wife at bedside bed in lowest position call be within reach

## 2021-11-19 NOTE — Progress Notes (Signed)
Pt seen in room alert/oriented in no apparent distress. Spouse at bediside. Hospital valuables policy has been discussed with no complaints.

## 2021-11-19 NOTE — Progress Notes (Signed)
Pt receives out-pt HD at [facility] on MWF. Pt has a 12:00 chair time. Will assist as needed.   Melven Sartorius Renal Navigator [phone]

## 2021-11-19 NOTE — ED Notes (Signed)
RT at bedside.

## 2021-11-19 NOTE — Consult Note (Signed)
Renal Service Consult Note [name]  Eric Mccarty 11/19/2021 [name], MD Requesting Physician: Dr. [name]  Reason for Consult: ESRD pt w/ SOB, cough HPI: The patient is a 66 y.o. year-old w/ hx of T1DM, sp failed KP transplant (2002) went back on HD jan 2023, off IS meds presented to ED yest w/ SOB and cough for 2-3 weeks, getting worse. In ED sats 89% on RA and no prior O2 use. BP 99/57, improved w/ IVF"s. WBC 14K, LA 4.1, Hb 9.7. glu 611 w/ AG 20 but w/ low beta hydroxybutyric acid. COVID neg. CTA chest showed no PE, extensive GG airspace disease suggestive of PNA. Pt started on IV abx. We are asked to see for dialysis.   Pt seen in the ED. He was placed on Bipap earlier and is breathing " a lot better". He denies any prod cough or CP, no fevers or chills.   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   Hyperlipidemia    Hyperparathyroidism ([place])    Hypertension    Iron deficiency    Legally blind    Renal disorder 2005   kidney transplant at [facility]   Past Surgical History  Past Surgical History:  Procedure Laterality Date   APPLICATION OF WOUND VAC Left 04/20/2017   Procedure: APPLICATION OF WOUND VAC LEFT LOWER EXTREMITY;  Surgeon: Wallace Going, DO;  Location: [place];  Service: Plastics;  Laterality: Left;   AV FISTULA PLACEMENT     AV FISTULA PLACEMENT Right 03/12/2021   Procedure: RIGHT ARM FISTULA CREATION;  Surgeon: Broadus John, MD;  Location: [place];  Service: Vascular;  Laterality: Right;  PERIPHERAL NERVE BLOCK   COLONOSCOPY     COMBINED  KIDNEY-PANCREAS TRANSPLANT     EMBOLECTOMY Left 01/15/2013   Procedure: EMBOLECTOMY Left Brachial Artery With Patch Angioplasty;  Surgeon: Elam Dutch, MD;  Location: [place];  Service: Vascular;  Laterality: Left;   EYE SURGERY     HERNIA REPAIR     KIDNEY TRANSPLANT     NEPHRECTOMY TRANSPLANTED ORGAN     SKIN SPLIT GRAFT Left 05/18/2017   Procedure: SKIN GRAFT SPLIT THICKNESS FROM LEFT UPPER LEG TO LEFT LOWER LEG WOUND;  Surgeon: Wallace Going, DO;  Location: [place];  Service: Plastics;  Laterality: Left;   UPPER EXTREMITY VENOGRAPHY Bilateral 03/11/2021   Procedure: UPPER EXTREMITY VENOGRAPHY;  Surgeon: Broadus John, MD;  Location: [place] [facility];  Service: Cardiovascular;  Laterality: Bilateral;   WOUND DEBRIDEMENT  05/18/2017   Procedure: DEBRIDEMENT LEFT LOWER LEG WOUND;  Surgeon: Wallace Going, DO;  Location: [place];  Service: Plastics;;   Family History  Family History  Problem Relation Age of Onset   Diabetes Mother    Hypertension Mother    Hypertension Father    Alzheimer's disease Father    Kidney disease Father    Social History  reports that he has never smoked. He has never used smokeless tobacco. He reports that he does  not drink alcohol and does not use drugs. Allergies No Known Allergies Home medications Prior to Admission medications   Medication Sig Start Date End Date Taking? Authorizing Provider  amitriptyline (ELAVIL) 25 MG tablet Take 25 mg by mouth at bedtime.    [provider]  amLODipine (NORVASC) 10 MG tablet Take 5 mg by mouth at bedtime. Per patient dose change from 10 mg to 5 mg po at bedtime 08/07/17   [provider]  aspirin 81 MG tablet Take 81 mg by mouth daily.    [provider]  atorvastatin (LIPITOR) 10 MG tablet Take 10 mg by mouth daily at 6 PM.     [provider]  calcitRIOL (ROCALTROL) 0.5 MCG capsule Take 0.5-1 mcg by mouth See admin  instructions. Take 1 mg on Monday, Wednesday and Friday morning and evening Take 0.5 all the other days in the Morning only 01/07/21   [provider]  Cyanocobalamin (VITAMIN B-12 PO) Take by mouth. Patient not taking: Reported on 06/30/2021    [provider]  dorzolamide-timolol (COSOPT) 22.3-6.8 MG/ML ophthalmic solution Place 1 drop into the left eye 2 (two) times daily. 01/14/21   [provider]  furosemide (LASIX) 40 MG tablet Take 80 mg by mouth daily. 01/11/21   [provider]  gabapentin (NEURONTIN) 600 MG tablet Take 300 mg by mouth at bedtime. Per patient dose change from '600mg'$  to '300mg'$  taken at bedtime    [provider]  glipiZIDE (GLUCOTROL) 10 MG tablet Take 10 mg by mouth daily. 12/13/20   [provider]  hydrALAZINE (APRESOLINE) 25 MG tablet Take 25 mg by mouth 2 (two) times daily. 07/26/17   [provider]  insulin glargine (LANTUS) 100 UNIT/ML injection Inject 6 Units into the skin at bedtime.    [provider]  labetalol (NORMODYNE) 200 MG tablet Take 200-400 mg by mouth See admin instructions. Take 200 mg  tablet in the morning and take 400 mg in the evening    [provider]  lipase/protease/amylase (CREON) 36000 UNITS CPEP capsule Take 1 capsule by mouth daily as needed (Stomach problem). 07/01/20   [provider]  MAGNESIUM GLUCONATE PO Take 1,200 mg by mouth 2 (two) times daily. Patient not taking: Reported on 06/30/2021    [provider]  mycophenolate (CELLCEPT) 250 MG capsule Take 250 mg by mouth 2 (two) times daily. 01/21/21   [provider]  oxyCODONE-acetaminophen (PERCOCET) 5-325 MG tablet Take 1 tablet by mouth every 6 (six) hours as needed for severe pain. 03/12/21 03/12/22  Baglia, Corrina, PA-C  pioglitazone (ACTOS) 45 MG tablet Take 45 mg by mouth daily. 01/02/21   [provider]  prednisoLONE acetate (PRED FORTE) 1 % ophthalmic suspension Place  1 drop into the left eye 2 (two) times daily. 01/27/21   [provider]  sorbitol 70 % solution Take 15 mLs by mouth daily as needed (constipation).     [provider]  tacrolimus (PROGRAF) 1 MG capsule Take 1 mg by mouth 2 (two) times daily.    [provider]     Vitals:   11/19/21 0630 11/19/21 0656 11/19/21 0700 11/19/21 0742  BP: 121/71  118/70   Pulse: 93  91   Resp: (!) 25  14   Temp:    99.3 F (37.4 C)  TempSrc:    Axillary  SpO2: 92% 98% 100%    Exam Gen alert, no distress, on bipap  No rash, cyanosis or gangrene  Sclera anicteric, throat clear  No jvd or bruits Chest bilat rales at bases RRR no MRG Abd soft ntnd no mass or ascites +bs GU normal male MS no joint effusions or deformity Ext trace pretib edema, no wounds or ulcers Neuro is alert, Ox 3 , nf    RUA AVF+ bruit   Home meds include - atorvastatin, calcitriol 0.5 mwf, lipase/protease/ amylase ac tid   OP HD: MWF SW 3h 36mn   400/600  70.5kg  2/2 bath P2  Hep none   RUA AVF - last HD 8/23, post wt 70.3 - last Hb 8.9 on 8/23, tsat 22% - mircera 50 q2, last 8/21 - iron sucrose '100mg'$  q hd thru 9/13 - doxercalciferol 8 ug IV tiw - cinacalcet 30 mg po tiw w hd  Assessment/ Plan: Acute hypoxic resp failure - due to pulm edema and presumed volume overload. Possible PNA. Has not missed HD, maybe has been losing body wt. Bp's soft but will need HD urgently this am w/ max UF goal.  ESRD - on HD MWF. Last HD yesterday, came off at dry wt. HD this am as above.  DM2 - per pmd  Anemia esrd - Hb 8- 10, no esa needs now. Last esa at OP was 8/21. Cont OP IV Fe load here thru 9/13 (100-125 mg per dose).   MBD ckd - Ca in range, add on alb/ phos. Cont sensipar, vdra IV.       RKelly Splinter MD 11/19/2021, 7:43 AM Recent Labs  Lab 11/18/21 2000 11/19/21 0023 11/19/21 0242 11/19/21 0533  HGB 9.7* 10.2*  --  8.0*  CALCIUM 8.9  --  9.2  --   CREATININE 4.37*  --  5.05*  --   K 3.9 3.9  3.4*  --

## 2021-11-19 NOTE — ED Notes (Signed)
Requested phlebotomy to draw blood cultures.

## 2021-11-19 NOTE — ED Provider Notes (Signed)
[facility] EMERGENCY DEPARTMENT Provider Note   CSN: [id number] Arrival date & time: 11/18/21  1753     History  Chief Complaint  Patient presents with   Shortness of Breath    Eric Mccarty is a 66 y.o. male.  HPI 66 year old male with a history of DM type II, history of kidney and pancreas transplant, hyperparathyroidism, on HD Monday Wednesday Friday to the ER with 2 weeks of progressive dry cough and shortness of breath.  Denies any fevers.  Denies any leg swelling.  Wife at bedside states that this started after they drove to [facility] for vacation.  He underwent dialysis in [facility].  Denies any productive cough.  On arrival to the ER, noted to be hypoxic with with O2 sats at 89% and placed on 2 L.  He states he does not wear oxygen at home.  Denies any chest pain.  Per chart review and patient report, patient had a kidney and pancreas transplant 23 years ago.  His kidney recently failed and his pancreas is only moderately working.  He was recently taken off immunosuppressive medications due to this.  He also states he had a recent basal cell carcinoma removal on skin graft, has been on doxycycline for this for 2 weeks.    Home Medications Prior to Admission medications   Medication Sig Start Date End Date Taking? Authorizing Provider  amitriptyline (ELAVIL) 25 MG tablet Take 25 mg by mouth at bedtime.    [provider]  amLODipine (NORVASC) 10 MG tablet Take 5 mg by mouth at bedtime. Per patient dose change from 10 mg to 5 mg po at bedtime 08/07/17   [provider]  aspirin 81 MG tablet Take 81 mg by mouth daily.    [provider]  atorvastatin (LIPITOR) 10 MG tablet Take 10 mg by mouth daily at 6 PM.     [provider]  calcitRIOL (ROCALTROL) 0.5 MCG capsule Take 0.5-1 mcg by mouth See admin instructions. Take 1 mg on Monday, Wednesday and Friday morning and evening Take 0.5 all the other days in the Morning  only 01/07/21   [provider]  Cyanocobalamin (VITAMIN B-12 PO) Take by mouth. Patient not taking: Reported on 06/30/2021    [provider]  dorzolamide-timolol (COSOPT) 22.3-6.8 MG/ML ophthalmic solution Place 1 drop into the left eye 2 (two) times daily. 01/14/21   [provider]  furosemide (LASIX) 40 MG tablet Take 80 mg by mouth daily. 01/11/21   [provider]  gabapentin (NEURONTIN) 600 MG tablet Take 300 mg by mouth at bedtime. Per patient dose change from '600mg'$  to '300mg'$  taken at bedtime    [provider]  glipiZIDE (GLUCOTROL) 10 MG tablet Take 10 mg by mouth daily. 12/13/20   [provider]  hydrALAZINE (APRESOLINE) 25 MG tablet Take 25 mg by mouth 2 (two) times daily. 07/26/17   [provider]  insulin glargine (LANTUS) 100 UNIT/ML injection Inject 6 Units into the skin at bedtime.    [provider]  labetalol (NORMODYNE) 200 MG tablet Take 200-400 mg by mouth See admin instructions. Take 200 mg  tablet in the morning and take 400 mg in the evening    [provider]  lipase/protease/amylase (CREON) 36000 UNITS CPEP capsule Take 1 capsule by mouth daily as needed (Stomach problem). 07/01/20   [provider]  MAGNESIUM GLUCONATE PO Take 1,200 mg by mouth 2 (two) times daily. Patient not taking: Reported  on 06/30/2021    [provider]  mycophenolate (CELLCEPT) 250 MG capsule Take 250 mg by mouth 2 (two) times daily. 01/21/21   [provider]  oxyCODONE-acetaminophen (PERCOCET) 5-325 MG tablet Take 1 tablet by mouth every 6 (six) hours as needed for severe pain. 03/12/21 03/12/22  Baglia, Corrina, PA-C  pioglitazone (ACTOS) 45 MG tablet Take 45 mg by mouth daily. 01/02/21   [provider]  prednisoLONE acetate (PRED FORTE) 1 % ophthalmic suspension Place 1 drop into the left eye 2 (two) times daily. 01/27/21   [provider]  sorbitol 70 % solution Take 15 mLs by  mouth daily as needed (constipation).     [provider]  tacrolimus (PROGRAF) 1 MG capsule Take 1 mg by mouth 2 (two) times daily.    [provider]      Allergies    Patient has no known allergies.    Review of Systems   Review of Systems Ten systems reviewed and are negative for acute change, except as noted in the HPI.   Physical Exam Updated Vital Signs BP 112/76   Pulse 88   Temp 97.8 F (36.6 C) (Oral)   Resp 17   SpO2 93%  Physical Exam Vitals and nursing note reviewed.  Constitutional:      General: He is not in acute distress.    Appearance: He is well-developed.  HENT:     Head: Normocephalic and atraumatic.  Eyes:     Conjunctiva/sclera: Conjunctivae normal.  Cardiovascular:     Rate and Rhythm: Normal rate and regular rhythm.     Heart sounds: No murmur heard. Pulmonary:     Effort: Pulmonary effort is normal. No respiratory distress.     Breath sounds: Decreased breath sounds present.     Comments: Frequent nonproductive cough with exhalation Abdominal:     Palpations: Abdomen is soft.     Tenderness: There is no abdominal tenderness.  Musculoskeletal:        General: No swelling.     Cervical back: Neck supple.     Comments: Bandage to right anterior leg/ankle consistent with recent skin graft  Skin:    General: Skin is warm and dry.     Capillary Refill: Capillary refill takes less than 2 seconds.  Neurological:     General: No focal deficit present.     Mental Status: He is alert.  Psychiatric:        Mood and Affect: Mood normal.     ED Results / Procedures / Treatments   Labs (all labs ordered are listed, but only abnormal results are displayed) Labs Reviewed  CBC WITH DIFFERENTIAL/PLATELET - Abnormal; Notable for the following components:      Result Value   WBC 14.8 (*)    RBC 3.06 (*)    Hemoglobin 9.7 (*)    HCT 29.8 (*)    Neutro Abs 12.0 (*)    Eosinophils Absolute 0.6 (*)    Abs Immature Granulocytes 0.17  (*)    All other components within normal limits  BRAIN NATRIURETIC PEPTIDE - Abnormal; Notable for the following components:   B Natriuretic Peptide 2,788.5 (*)    All other components within normal limits  BASIC METABOLIC PANEL - Abnormal; Notable for the following components:   Sodium 134 (*)    Chloride 89 (*)    Glucose, Bld 611 (*)    Creatinine, Ser 4.37 (*)    GFR, Estimated 14 (*)    Anion  gap 20 (*)    All other components within normal limits  I-STAT VENOUS BLOOD GAS, ED - Abnormal; Notable for the following components:   pH, Ven 7.489 (*)    pCO2, Ven 36.8 (*)    pO2, Ven 109 (*)    Acid-Base Excess 4.0 (*)    Sodium 129 (*)    Calcium, Ion 0.96 (*)    HCT 30.0 (*)    Hemoglobin 10.2 (*)    All other components within normal limits  CBG MONITORING, ED - Abnormal; Notable for the following components:   Glucose-Capillary >600 (*)    All other components within normal limits  CBG MONITORING, ED - Abnormal; Notable for the following components:   Glucose-Capillary 557 (*)    All other components within normal limits  SARS CORONAVIRUS 2 BY RT PCR  URINALYSIS, ROUTINE W REFLEX MICROSCOPIC  CBG MONITORING, ED    EKG None  Radiology DG Chest 1 View  Result Date: 11/18/2021 CLINICAL DATA:  Shortness of breath, cough EXAM: CHEST  1 VIEW COMPARISON:  07/13/2009 FINDINGS: Transverse diameter of heart is increased. Central pulmonary vessels are prominent. Extensive patchy interstitial and alveolar densities are seen in both lungs. There is blunting of both lateral CP angles. There is no pneumothorax. IMPRESSION: Interval appearance of extensive interstitial and alveolar densities in both parahilar regions and both lower lung fields. Findings suggest pulmonary edema or extensive bilateral pneumonia. Small bilateral pleural effusions are seen. Electronically Signed   By: Elmer Picker M.D.   On: 11/18/2021 20:39    Procedures Procedures    Medications Ordered in  ED Medications  iohexol (OMNIPAQUE) 350 MG/ML injection 100 mL (has no administration in time range)  insulin regular, human (MYXREDLIN) 100 units/ 100 mL infusion (has no administration in time range)  lactated ringers infusion (has no administration in time range)  dextrose 5 % in lactated ringers infusion (has no administration in time range)  dextrose 50 % solution 0-50 mL (has no administration in time range)  insulin aspart (novoLOG) injection 20 Units (20 Units Intravenous Given 11/19/21 0012)    ED Course/ Medical Decision Making/ A&P                           Medical Decision Making Amount and/or Complexity of Data Reviewed Labs: ordered. Radiology: ordered.  Risk Prescription drug management. Decision regarding hospitalization.   66 year old male presented to the ER with concerns for cough and shortness of breath x2 weeks.  He was noted to be hypoxic in the ER and placed on 2 L nasal cannula, not on oxygen at home.  He has been compliant with his dialysis sessions.  Symptoms started after a trip to [facility].    Differential diagnosis includes volume overload, new onset CHF, pneumonia, COVID-19, PE  Labs ordered, reviewed and interpreted by me.  BMP with mild hyponatremia, chloride of 89, glucose of 611, anion gap of 20 but CO2 of 25, VBG with a respiratory alkalosis.  CBC with a leukocytosis of 14.8, BNP 2788.5 however  ESRD may be contributory to this. COVID is negative.  EKG is nonischemic however QT is prolonged  Ordered, reviewed and interpreted by me, agree with radiology read. Chest x-ray with extensive interstitial and alveolar densities suggestive of pulmonary edema or extensive bilateral pneumonia. PE study ordered given recent car ride to [facility].  This revealed no PE but extensive bilateral groundglass airspace disease compatible with pneumonia and small bilateral  pleural effusions.  Patient received 20 units of insulin however blood sugar decreased to  380.  Started on azithromycin and Rocephin for community-acquired pneumonia.   Given the above work-up, and in the setting of new onset hypoxia, the patient would benefit from admission for further antibiotic treatment and close monitoring.  Consulted hospitalist for admission.  Spoke with Dr. Damita Dunnings who requested repeat BMP as her some concern that the patient is in DKA.  Lactic acid at 4.1, sepsis protocol initiated, IV fluids ordered.  Patient had already received Rocephin and azithromycin.  Blood culture sent prior to antibiotic initiation.  Repeat BMP with a glucose of 340, slightly improved anion gap of 18, BUN of 28.  Final diagnoses:  None    Rx / DC Orders ED Discharge Orders     None         Garald Balding, PA-C 12/01/21 1856    Orpah Greek, MD 12/01/21 [phone]

## 2021-11-19 NOTE — Assessment & Plan Note (Addendum)
Past documentation of type I versus type II on chart review Currently on glipizide and insulin.  Had a C-peptide done by transplant doctors on 11/10/2021 that was 7 Elevated anion gap but normal beta hydroxy under 3 and not acidotic Will treat with insulin infusion given severity of current illness, dialysis status until blood pressure and transition to subcu 1 blood sugars more under control Follow A1c

## 2021-11-19 NOTE — Plan of Care (Signed)
  Problem: Education: Goal: Knowledge of General Education information will improve Description: Including pain rating scale, medication(s)/side effects and non-pharmacologic comfort measures Outcome: Progressing   Problem: Health Behavior/Discharge Planning: Goal: Ability to manage health-related needs will improve Outcome: Progressing   Problem: Clinical Measurements: Goal: Respiratory complications will improve Outcome: Progressing   Problem: Elimination: Goal: Will not experience complications related to bowel motility Outcome: Progressing

## 2021-11-20 DIAGNOSIS — J189 Pneumonia, unspecified organism: Secondary | ICD-10-CM | POA: Diagnosis not present

## 2021-11-20 DIAGNOSIS — N186 End stage renal disease: Secondary | ICD-10-CM | POA: Diagnosis not present

## 2021-11-20 DIAGNOSIS — J9601 Acute respiratory failure with hypoxia: Secondary | ICD-10-CM | POA: Diagnosis not present

## 2021-11-20 DIAGNOSIS — R739 Hyperglycemia, unspecified: Secondary | ICD-10-CM | POA: Diagnosis not present

## 2021-11-20 LAB — AMYLASE: Amylase: 200 U/L — ABNORMAL HIGH (ref 28–100)

## 2021-11-20 LAB — BASIC METABOLIC PANEL
Anion gap: 17 — ABNORMAL HIGH (ref 5–15)
BUN: 30 mg/dL — ABNORMAL HIGH (ref 8–23)
CO2: 25 mmol/L (ref 22–32)
Calcium: 9.4 mg/dL (ref 8.9–10.3)
Chloride: 95 mmol/L — ABNORMAL LOW (ref 98–111)
Creatinine, Ser: 4.81 mg/dL — ABNORMAL HIGH (ref 0.61–1.24)
GFR, Estimated: 13 mL/min — ABNORMAL LOW (ref >60)
Glucose, Bld: 189 mg/dL — ABNORMAL HIGH (ref 70–99)
Potassium: 3.3 mmol/L — ABNORMAL LOW (ref 3.5–5.1)
Sodium: 137 mmol/L (ref 135–145)

## 2021-11-20 LAB — PROTIME-INR
INR: 1.2 (ref 0.8–1.2)
Prothrombin Time: 15.5 seconds — ABNORMAL HIGH (ref 11.4–15.2)

## 2021-11-20 LAB — GLUCOSE, CAPILLARY
Glucose-Capillary: 121 mg/dL — ABNORMAL HIGH (ref 70–99)
Glucose-Capillary: 182 mg/dL — ABNORMAL HIGH (ref 70–99)
Glucose-Capillary: 138 mg/dL — ABNORMAL HIGH (ref 70–99)
Glucose-Capillary: 278 mg/dL — ABNORMAL HIGH (ref 70–99)
Glucose-Capillary: 253 mg/dL — ABNORMAL HIGH (ref 70–99)

## 2021-11-20 LAB — HEPATITIS B SURFACE ANTIBODY, QUANTITATIVE: Hep B S AB Quant (Post): >1000 m[IU]/mL (ref >9.9)

## 2021-11-20 LAB — PROCALCITONIN: Procalcitonin: 2.14 ng/mL

## 2021-11-20 LAB — LIPASE, BLOOD: Lipase: 93 U/L — ABNORMAL HIGH (ref 11–51)

## 2021-11-20 LAB — CORTISOL-AM, BLOOD: Cortisol - AM: 18.6 ug/dL (ref 6.7–22.6)

## 2021-11-20 MED ORDER — BENZONATATE 100 MG PO CAPS
200.0000 mg | ORAL_CAPSULE | Freq: Three times a day (TID) | ORAL | Status: DC
  Administered 2021-11-20 – 2021-11-21 (×4): 200 mg via ORAL
  Filled 2021-11-20 (×5): qty 2

## 2021-11-20 MED ORDER — INSULIN GLARGINE-YFGN 100 UNIT/ML ~~LOC~~ SOLN
9.0000 [IU] | Freq: Every day | SUBCUTANEOUS | Status: DC
  Administered 2021-11-20 – 2021-11-21 (×2): 9 [IU] via SUBCUTANEOUS
  Filled 2021-11-20 (×2): qty 0.09

## 2021-11-20 MED ORDER — BENZONATATE 100 MG PO CAPS
100.0000 mg | ORAL_CAPSULE | Freq: Once | ORAL | Status: AC
  Administered 2021-11-20: 100 mg via ORAL
  Filled 2021-11-20: qty 1

## 2021-11-20 MED ORDER — INSULIN ASPART 100 UNIT/ML IJ SOLN
0.0000 [IU] | Freq: Three times a day (TID) | INTRAMUSCULAR | Status: DC
  Administered 2021-11-20: 5 [IU] via SUBCUTANEOUS
  Administered 2021-11-21: 7 [IU] via SUBCUTANEOUS

## 2021-11-20 MED ORDER — GUAIFENESIN-CODEINE 100-10 MG/5ML PO SOLN: 5.0000 mL | Freq: Four times a day (QID) | ORAL | Status: DC | PRN

## 2021-11-20 NOTE — Progress Notes (Signed)
Received patient in bed to unit.  Alert and oriented.  Informed consent signed and in chart.   Treatment initiated: 0937 Treatment completed: 1222  Patient tolerated well.  Transported back to the room  Alert, without acute distress.  Hand-off given to patient's nurse.   Access used: [email or web address] Access issues: none  Total UF removed: 1537 Medication(s) given: Hectorol, Ferrlecit Post HD VS: 105/57,98.3,88,20,100% Post HD weight: 69.4kg   [facility]

## 2021-11-20 NOTE — Inpatient Diabetes Management (Signed)
Inpatient Diabetes Program Recommendations  AACE/ADA: New Consensus Statement on Inpatient Glycemic Control (2015)  Target Ranges:  Prepandial:   less than 140 mg/dL      Peak postprandial:   less than 180 mg/dL (1-2 hours)      Critically ill patients:  140 - 180 mg/dL   Lab Results  Component Value Date   GLUCAP 121 (H) 11/20/2021   HGBA1C 8.4 (H) 11/19/2021    Review of Glycemic Control  Latest Reference Range & Units 11/19/21 16:32 11/19/21 20:38 11/19/21 23:51 11/20/21 04:44  Glucose-Capillary 70 - 99 mg/dL 202 (H) 315 (H) 241 (H) 121 (H)  (H): Data is abnormally high  Diabetes history: DM 1.5 Outpatient Diabetes medications: Lantus 6 units QD,Glipizide 10 mg QD, Actos 45 mg QD Current orders for Inpatient glycemic control: Semglee 6 units QD, Novolog 0-6 units Q4H, Carb modified diet  Inpatient Diabetes Program Recommendations  Please consider:  Novolog 0-6 units TID and 0-5 units QHS Novolog 3 units TID with meals if consumes at least 50%  Will continue to follow while inpatient.  Thank you, Reche Dixon, MSN, [place] Diabetes Coordinator Inpatient Diabetes Program [phone] (team pager from 8a-5p)

## 2021-11-20 NOTE — Progress Notes (Signed)
[facility]                                                                              Eric Mccarty, is a 66 y.o. male, DOB - [birth year], FKC:[id number] Admit date - 11/18/2021    Outpatient Primary MD for the patient is Eric Parish, MD  LOS - 1  days  Chief Complaint  Patient presents with   Shortness of Breath       Brief summary   Patient is a 66 y.o. 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  Denied fevers or chest pain.   In ED, O2 sats down to 82%, placed on 2 L O2, no prior history of O2 use Initial glucose 611 with anion gap of 20, venous pH 7.49, BHB 0.4 COVID-19 negative.  CTA chest no PE but extensive bilateral groundglass airspace disease most compatible with pneumonia as well as bilateral pleural effusions.   Assessment & Plan    Principal Problem: Acute respiratory failure with hypoxia secondary to bilateral pneumonia and pulmonary edema in the setting of ESRD Severe sepsis -CT chest showed no PE, patient met sepsis criteria with fevers, tachypnea, hypoxia, leukocytosis, lactic acidosis, -Off BiPAP, underwent urgent hemodialysis on 8/24 -Undergoing HD per his schedule today -Continue IV cefepime, Zithromax, vancomycin, added antitussives due to coughing spells.   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 Labs were checked as part of his transplant work-up as requested by patient and his wife.     Code Status: Full code old DVT Prophylaxis:  enoxaparin (LOVENOX) injection 30 mg Start: 11/19/21 1000   Level of Care: Level of care: Progressive Family Communication: Updated patient's wife on phone on 8/24.  Disposition Plan:      Remains inpatient appropriate: Hopefully DC home in a.m.   Procedures:  None  Consultants:   Renal   Antimicrobials:   Anti-infectives (From admission, onward)  Start     Dose/Rate Route Frequency Ordered Stop   11/20/21 1200  vancomycin (VANCOREADY) IVPB 750 mg/150 mL        750 mg 150 mL/hr over 60 Minutes Intravenous Every M-W-F (Hemodialysis) 11/19/21 0539     11/19/21 2200  azithromycin (ZITHROMAX) 500 mg in sodium chloride 0.9 % 250 mL IVPB        500 mg 250 mL/hr over 60 Minutes Intravenous Every 24 hours 11/19/21 0527 11/24/21 2159   11/19/21 2200  ceFEPIme (MAXIPIME) 1 g in sodium chloride 0.9 % 100 mL IVPB        1 g 200 mL/hr over 30 Minutes Intravenous Every 24 hours 11/19/21 0539     11/19/21 0545  vancomycin (VANCOREADY) IVPB 1500 mg/300 mL        1,500 mg 150 mL/hr over 120 Minutes Intravenous  Once 11/19/21 0539 11/19/21 0710   11/19/21  0545  ceFEPIme (MAXIPIME) 2 g in sodium chloride 0.9 % 100 mL IVPB        2 g 200 mL/hr over 30 Minutes Intravenous  Once 11/19/21 0539 11/19/21 0627   11/19/21 0145  cefTRIAXone (ROCEPHIN) 1 g in sodium chloride 0.9 % 100 mL IVPB        1 g 200 mL/hr over 30 Minutes Intravenous  Once 11/19/21 0143 11/19/21 0315   11/19/21 0145  azithromycin (ZITHROMAX) 500 mg in sodium chloride 0.9 % 250 mL IVPB        500 mg 250 mL/hr over 60 Minutes Intravenous  Once 11/19/21 0143 11/19/21 0314          Medications  aspirin EC  81 mg Oral Daily   atorvastatin  10 mg Oral q1800   benzonatate  200 mg Oral TID   calcitRIOL  1 mcg Oral 2 times per day on Mon Wed Fri   And   calcitRIOL  0.5 mcg Oral Once per day on Sun Tue Thu Sat   Chlorhexidine Gluconate Cloth  6 each Topical Q0600   Chlorhexidine Gluconate Cloth  6 each Topical Q0600   cinacalcet  30 mg Oral Q M,W,F   doxercalciferol  8 mcg Intravenous Q M,W,F-HD   enoxaparin (LOVENOX) injection  30 mg Subcutaneous Daily   gabapentin  300 mg Oral QHS   insulin aspart  0-6 Units Subcutaneous Q4H   insulin glargine-yfgn  6 Units Subcutaneous Daily   midodrine  10 mg Oral Once in dialysis   mycophenolate  250 mg Oral Daily   sevelamer carbonate  1,600 mg Oral TID with meals      Subjective:   Eric Mccarty was seen and examined today.  Off the BiPAP, shortness of breath is improving, still having a lot of coughing spells.  No dizziness, chest pain, fevers.  Objective:   Vitals:   11/20/21 1130 11/20/21 1200 11/20/21 1227 11/20/21 1324  BP: (!) 105/59 (!) 94/58 105/67 112/61  Pulse: 85 87 88 93  Resp: (!) 24 (!) $Remo'23 20 18  'vQqvC$ Temp:   98.3 F (36.8 C) 97.8 F (36.6 C)  TempSrc:    Oral  SpO2: 100% 96% 100% 98%  Weight:   69.4 kg     Intake/Output Summary (Last 24 hours) at 11/20/2021 1333 Last data filed at 11/20/2021 1227 Gross per 24 hour  Intake 1222.51 ml  Output 2700 ml  Net -1477.49 ml     Wt Readings from Last 3  Encounters:  11/20/21 69.4 kg  06/30/21 69.7 kg  04/23/21 69.9 kg  Physical Exam General: Alert and oriented x 3, NAD Cardiovascular: S1 S2 clear, RRR.  Respiratory: CTAB, no wheezing, rales  Gastrointestinal: Soft, nontender, nondistended, NBS Ext: no pedal edema bilaterally Neuro: no new deficits Psych: Normal affect and demeanor, alert and oriented x3    Data Reviewed:  I have personally reviewed following labs    CBC Lab Results  Component Value Date   WBC 13.7 (H) 11/19/2021   RBC 2.51 (L) 11/19/2021   HGB 8.0 (L) 11/19/2021   HCT 24.7 (L) 11/19/2021   MCV 98.4 11/19/2021   MCH 31.9 11/19/2021   PLT 204 11/19/2021   MCHC 32.4 11/19/2021   RDW 13.4 11/19/2021   LYMPHSABS 0.9 11/18/2021   MONOABS 1.0 11/18/2021   EOSABS 0.6 (H) 11/18/2021   BASOSABS 0.1 68/02/7516     Last metabolic panel Lab Results  Component Value Date   NA 137 11/20/2021   K 3.3 (L) 11/20/2021   CL 95 (L) 11/20/2021   CO2 25 11/20/2021   BUN 30 (H) 11/20/2021   CREATININE 4.81 (H) 11/20/2021   GLUCOSE 189 (H) 11/20/2021   GFRNONAA 13 (L) 11/20/2021   GFRAA 27 (L) 11/09/2018   CALCIUM 9.4 11/20/2021   PHOS 3.7 11/19/2021   PROT 5.5 (L) 02/02/2021   ALBUMIN 2.7 (L) 11/19/2021   BILITOT 0.7 02/02/2021   ALKPHOS 107 02/02/2021   AST 14 (L) 02/02/2021   ALT 15 02/02/2021   ANIONGAP 17 (H) 11/20/2021    CBG (last 3)  Recent Labs    11/20/21 0444 11/20/21 0818 11/20/21 1216  GLUCAP 121* 182* 138*      Coagulation Profile: Recent Labs  Lab 11/20/21 0158  INR 1.2     Radiology Studies: I have personally reviewed the imaging studies  DG Chest Portable 1 View  Result Date: 11/19/2021  IMPRESSION: 1. The appearance the chest suggests worsening congestive heart failure, as above. The possibility of superimposed multilobar bilateral bronchopneumonia is not excluded. 2. Aortic atherosclerosis. Electronically Signed   By: Vinnie Langton M.D.   On: 11/19/2021 07:39   CT  Angio Chest PE W and/or Wo Contrast  Result Date: 11/19/2021  IMPRESSION: No evidence of pulmonary embolus. Extensive bilateral ground-glass airspace disease most compatible with pneumonia. Small bilateral pleural effusions. Diffuse coronary artery disease. Aortic Atherosclerosis (ICD10-I70.0). Electronically Signed   By: Rolm Baptise M.D.   On: 11/19/2021 01:38   DG Chest 1 View  Result Date: 11/18/2021 IMPRESSION: Interval appearance of extensive interstitial and alveolar densities in both parahilar regions and both lower lung fields. Findings suggest pulmonary edema or extensive bilateral pneumonia. Small bilateral pleural effusions are seen. Electronically Signed   By: Elmer Picker M.D.   On: 11/18/2021 20:39       Lakevia Perris M.D. [facility] 11/20/2021, 1:33 PM  Available via Epic secure chat 7am-7pm After 7 pm, please refer to night coverage provider listed on amion.

## 2021-11-20 NOTE — Plan of Care (Signed)
  Problem: Coping: Goal: Ability to adjust to condition or change in health will improve Outcome: Progressing   Problem: Fluid Volume: Goal: Ability to maintain a balanced intake and output will improve Outcome: Progressing   Problem: Metabolic: Goal: Ability to maintain appropriate glucose levels will improve Outcome: Progressing   Problem: Nutritional: Goal: Maintenance of adequate nutrition will improve Outcome: Progressing   

## 2021-11-20 NOTE — Progress Notes (Signed)
Subjective: Seen in hemodialysis, today is his normal day, 3 L HD UF yesterday tolerated states he feels better still has a dry cough.  Objective Vital signs in last 24 hours: Vitals:   11/20/21 1100 11/20/21 1130 11/20/21 1200 11/20/21 1227  BP: (!) 110/56 (!) 105/59 (!) 94/58 105/67  Pulse: 84 85 87 88  Resp: 19 (!) 24 (!) 23 20  Temp:    98.3 F (36.8 C)  TempSrc:      SpO2: 96% 100% 96% 100%  Weight:    69.4 kg   Weight change:   Physical Exam: General: Alert male on HD NAD Heart: RRR no MRG Lungs: CTA nonlabored breathing Abdomen: NABS, soft NT ND Extremities: No pedal edema  Dialysis Access: Patent on HD R UA AVF  OP HD: MWF SW 3h 22mn   400/600  70.5kg  2/2 bath P2  Hep none   RUA AVF - last HD 8/23, post wt 70.3 - last Hb 8.9 on 8/23, tsat 22% - mircera 50 q2, last 8/21 - iron sucrose '100mg'$  q hd thru 9/13 - doxercalciferol 8 ug IV tiw - cinacalcet 30 mg po tiw w hd  Problem/Plan: Acute hypoxic respiratory failure= due to pulmonary edema and also possible pneumonia-recent new start with HD having losing body weight , when I saw him on outpatient HD this week he was still on BP meds which I decreased, here BP soft given one-time dose midodrine today 10 mg, currently is tolerating UF follow-up weights need to lower dry weight antibiotics per admit team for pneumonia ESRD -HD MWF schedule Anemia -Hgb 8.0 (ESA Mircera 50 mcg given 8/21 outpatient HD )follow-up trend was on IV iron through 9/13 Secondary hyperparathyroidism -calcium in range and phosphorus DM type II= hemoglobin A1c 8.4 plan per admit team History of pancreatic transplant= currently no meds on Creon  DErnest Haber PA-C [id number] 10258/25/2023,1:02 PM  LOS: 1 day   Labs: Basic Metabolic Panel: Recent Labs  Lab 11/18/21 2000 11/19/21 0023 11/19/21 0242 11/19/21 1455 11/20/21 0158  NA 134* 129* 134*  --  137  K 3.9 3.9 3.4*  --  3.3*  CL 89*  --  91*  --  95*   CO2 25  --  25  --  25  GLUCOSE 611*  --  340*  --  189*  BUN 23  --  28*  --  30*  CREATININE 4.37*  --  5.05*  --  4.81*  CALCIUM 8.9  --  9.2  --  9.4  PHOS  --   --   --  3.7  --    Liver Function Tests: Recent Labs  Lab 11/19/21 1455  ALBUMIN 2.7*   Recent Labs  Lab 11/20/21 0158  LIPASE 93*  AMYLASE 200*   No results for input(s): "AMMONIA" in the last 168 hours. CBC: Recent Labs  Lab 11/18/21 2000 11/19/21 0023 11/19/21 0533  WBC 14.8*  --  13.7*  NEUTROABS 12.0*  --   --   HGB 9.7* 10.2* 8.0*  HCT 29.8* 30.0* 24.7*  MCV 97.4  --  98.4  PLT 242  --  204   Cardiac Enzymes: No results for input(s): "CKTOTAL", "CKMB", "CKMBINDEX", "TROPONINI" in the last 168 hours. CBG: Recent Labs  Lab 11/19/21 2038 11/19/21 2351 11/20/21 0444 11/20/21 0818 11/20/21 1216  GLUCAP 315* 241* 121* 182* 138*    Studies/Results: DG Chest Portable 1 View  Result Date: 11/19/2021 CLINICAL DATA:  66year old male  with history of possible fluid in the lungs. EXAM: PORTABLE CHEST 1 VIEW COMPARISON:  Chest x-ray 11/18/2021. FINDINGS: There is marked cephalization of the pulmonary vasculature, severe indistinctness of the interstitial markings, and extensive multifocal airspace disease throughout the lungs bilaterally suggestive of severe pulmonary edema. Small bilateral pleural effusions. No pneumothorax. Heart size is mildly enlarged. Upper mediastinal contours are within normal limits. Atherosclerotic calcifications in the thoracic aorta. IMPRESSION: 1. The appearance the chest suggests worsening congestive heart failure, as above. The possibility of superimposed multilobar bilateral bronchopneumonia is not excluded. 2. Aortic atherosclerosis. Electronically Signed   By: Vinnie Langton M.D.   On: 11/19/2021 07:39   CT Angio Chest PE W and/or Wo Contrast  Result Date: 11/19/2021 CLINICAL DATA:  Pulmonary embolism (PE) suspected, high prob. Shortness of breath, cough EXAM: CT  ANGIOGRAPHY CHEST WITH CONTRAST TECHNIQUE: Multidetector CT imaging of the chest was performed using the standard protocol during bolus administration of intravenous contrast. Multiplanar CT image reconstructions and MIPs were obtained to evaluate the vascular anatomy. RADIATION DOSE REDUCTION: This exam was performed according to the departmental dose-optimization program which includes automated exposure control, adjustment of the mA and/or kV according to patient size and/or use of iterative reconstruction technique. CONTRAST:  46m OMNIPAQUE IOHEXOL 350 MG/ML SOLN COMPARISON:  None Available. FINDINGS: Cardiovascular: No filling defects in the pulmonary arteries to suggest pulmonary emboli. Heart upper limits normal in size. Diffuse coronary artery and scattered aortic calcifications. No aneurysm. Mediastinum/Nodes: Borderline sized mediastinal and bilateral hilar lymph nodes. No axillary adenopathy. Lungs/Pleura: Extensive bilateral airspace disease most compatible with pneumonia. Small bilateral pleural effusions. Upper Abdomen: No acute findings Musculoskeletal: Chest wall soft tissues are unremarkable. No acute bony abnormality. Review of the MIP images confirms the above findings. IMPRESSION: No evidence of pulmonary embolus. Extensive bilateral ground-glass airspace disease most compatible with pneumonia. Small bilateral pleural effusions. Diffuse coronary artery disease. Aortic Atherosclerosis (ICD10-I70.0). Electronically Signed   By: KRolm BaptiseM.D.   On: 11/19/2021 01:38   DG Chest 1 View  Result Date: 11/18/2021 CLINICAL DATA:  Shortness of breath, cough EXAM: CHEST  1 VIEW COMPARISON:  07/13/2009 FINDINGS: Transverse diameter of heart is increased. Central pulmonary vessels are prominent. Extensive patchy interstitial and alveolar densities are seen in both lungs. There is blunting of both lateral CP angles. There is no pneumothorax. IMPRESSION: Interval appearance of extensive interstitial and  alveolar densities in both parahilar regions and both lower lung fields. Findings suggest pulmonary edema or extensive bilateral pneumonia. Small bilateral pleural effusions are seen. Electronically Signed   By: PElmer PickerM.D.   On: 11/18/2021 20:39   Medications:  azithromycin Stopped (11/20/21 0218)   ceFEPime (MAXIPIME) IV Stopped (11/20/21 0055)   dextrose 5% lactated ringers Stopped (11/19/21 0537)   ferric gluconate (FERRLECIT) IVPB Stopped (11/20/21 1117)   lactated ringers     vancomycin Stopped (11/20/21 1117)    aspirin EC  81 mg Oral Daily   atorvastatin  10 mg Oral q1800   benzonatate  200 mg Oral TID   calcitRIOL  1 mcg Oral 2 times per day on Mon Wed Fri   And   calcitRIOL  0.5 mcg Oral Once per day on Sun Tue Thu Sat   Chlorhexidine Gluconate Cloth  6 each Topical Q0600   Chlorhexidine Gluconate Cloth  6 each Topical Q0600   cinacalcet  30 mg Oral Q M,W,F   doxercalciferol  8 mcg Intravenous Q M,W,F-HD   enoxaparin (LOVENOX) injection  30 mg Subcutaneous  Daily   gabapentin  300 mg Oral QHS   insulin aspart  0-6 Units Subcutaneous Q4H   insulin glargine-yfgn  6 Units Subcutaneous Daily   midodrine  10 mg Oral Once in dialysis   mycophenolate  250 mg Oral Daily   sevelamer carbonate  1,600 mg Oral TID with meals

## 2021-11-20 NOTE — Progress Notes (Signed)
HOSPITAL MEDICINE OVERNIGHT EVENT NOTE    Patient complaining of ongoing dry severe cough preventing the patient from sleeping.  Nursing states that patient is requesting an antitussive.  Typically antitussives should be avoided in patients with suspected pneumonia however patient's cough is nonproductive and quite severe.  We will give a one-time dose of Tessalon Perles for patient comfort.  Eric Emerald  MD [facility]

## 2021-11-21 DIAGNOSIS — J189 Pneumonia, unspecified organism: Secondary | ICD-10-CM | POA: Diagnosis not present

## 2021-11-21 DIAGNOSIS — N186 End stage renal disease: Secondary | ICD-10-CM | POA: Diagnosis not present

## 2021-11-21 DIAGNOSIS — Z992 Dependence on renal dialysis: Secondary | ICD-10-CM | POA: Diagnosis not present

## 2021-11-21 DIAGNOSIS — J9601 Acute respiratory failure with hypoxia: Secondary | ICD-10-CM | POA: Diagnosis not present

## 2021-11-21 LAB — GLUCOSE, CAPILLARY
Glucose-Capillary: 337 mg/dL — ABNORMAL HIGH (ref 70–99)
Glucose-Capillary: 332 mg/dL — ABNORMAL HIGH (ref 70–99)

## 2021-11-21 LAB — BASIC METABOLIC PANEL
Anion gap: 16 — ABNORMAL HIGH (ref 5–15)
BUN: 38 mg/dL — ABNORMAL HIGH (ref 8–23)
CO2: 25 mmol/L (ref 22–32)
Calcium: 9.2 mg/dL (ref 8.9–10.3)
Chloride: 93 mmol/L — ABNORMAL LOW (ref 98–111)
Creatinine, Ser: 5.23 mg/dL — ABNORMAL HIGH (ref 0.61–1.24)
GFR, Estimated: 12 mL/min — ABNORMAL LOW (ref >60)
Glucose, Bld: 316 mg/dL — ABNORMAL HIGH (ref 70–99)
Potassium: 3.5 mmol/L (ref 3.5–5.1)
Sodium: 134 mmol/L — ABNORMAL LOW (ref 135–145)

## 2021-11-21 MED ORDER — AZITHROMYCIN 500 MG PO TABS: 500.0000 mg | ORAL_TABLET | Freq: Every day | ORAL | 0 refills | Status: AC

## 2021-11-21 MED ORDER — BENZONATATE 200 MG PO CAPS: 200.0000 mg | ORAL_CAPSULE | Freq: Three times a day (TID) | ORAL | 0 refills | Status: AC | PRN

## 2021-11-21 MED ORDER — CEFPODOXIME PROXETIL 200 MG PO TABS: 200.0000 mg | ORAL_TABLET | Freq: Every day | ORAL | 0 refills | Status: AC

## 2021-11-21 MED ORDER — GUAIFENESIN-CODEINE 100-10 MG/5ML PO SOLN: 5.0000 mL | Freq: Three times a day (TID) | ORAL | 0 refills | Status: AC | PRN

## 2021-11-21 MED ORDER — AZITHROMYCIN 500 MG PO TABS
500.0000 mg | ORAL_TABLET | Freq: Every day | ORAL | Status: DC
  Filled 2021-11-21: qty 1

## 2021-11-21 MED ORDER — INSULIN GLARGINE 100 UNIT/ML ~~LOC~~ SOLN: 10.0000 [IU] | Freq: Every day | SUBCUTANEOUS | 11 refills | Status: AC

## 2021-11-21 NOTE — Progress Notes (Signed)
Subjective:  seen in rm , denies sob tolerated hd yest, will have lower edw and dc bp meds   Objective Vital signs in last 24 hours: Vitals:   11/20/21 2139 11/21/21 0500 11/21/21 0640 11/21/21 0754  BP: 110/60 119/64  (!) 113/33  Pulse: 94 88  91  Resp: '20 17  20  '$ Temp:   98.3 F (36.8 C) 98.3 F (36.8 C)  TempSrc:   Oral Oral  SpO2: 99% 97%  98%  Weight:       Weight change: -0.3 kg  Physical Exam: General: Alert male NAD Heart: RRR no MRG Lungs: CTA nonlabored breathing Abdomen: NABS, soft NT ND Extremities: No pedal edema  Dialysis Access: +bruit  R UA AVF   OP HD: MWF SW 3h 26mn   400/600  70.5kg  2/2 bath P2  Hep none   RUA AVF - last HD 8/23, post wt 70.3 - last Hb 8.9 on 8/23, tsat 22% - mircera 50 q2, last 8/21 - iron sucrose '100mg'$  q hd thru 9/13 - doxercalciferol 8 ug IV tiw - cinacalcet 30 mg po tiw w hd   Problem/Plan: Acute hypoxic respiratory failure= due to pulmonary edema and pna -recent new start with HD having losing body weight , when I saw him on outpatient HD this week he was still on BP meds which were decreased, here BP soft given one-time dose midodrine 2,6  liter  UF, no stand wts .70 lower dry to 69 antibiotics per admit team for pneumonia ESRD -HD MWF schedule Anemia -Hgb 8.0 (ESA Mircera 50 mcg given 8/21 outpatient HD )follow-up trend was on IV iron through 9/13 Secondary hyperparathyroidism -calcium in range and phosphorus DM type II= hemoglobin A1c 8.4 plan per admit team History of pancreatic transplant= still on Mycophenolate   DErnest Haber PA-C [id number]/26/2023,11:16 AM  LOS: 2 days   Labs: Basic Metabolic Panel: Recent Labs  Lab 11/19/21 0242 11/19/21 1455 11/20/21 0158 11/21/21 0212  NA 134*  --  137 134*  K 3.4*  --  3.3* 3.5  CL 91*  --  95* 93*  CO2 25  --  25 25  GLUCOSE 340*  --  189* 316*  BUN 28*  --  30* 38*  CREATININE 5.05*  --  4.81* 5.23*  CALCIUM 9.2  --  9.4 9.2   PHOS  --  3.7  --   --    Liver Function Tests: Recent Labs  Lab 11/19/21 1455  ALBUMIN 2.7*   Recent Labs  Lab 11/20/21 0158  LIPASE 93*  AMYLASE 200*   No results for input(s): "AMMONIA" in the last 168 hours. CBC: Recent Labs  Lab 11/18/21 2000 11/19/21 0023 11/19/21 0533  WBC 14.8*  --  13.7*  NEUTROABS 12.0*  --   --   HGB 9.7* 10.2* 8.0*  HCT 29.8* 30.0* 24.7*  MCV 97.4  --  98.4  PLT 242  --  204   Cardiac Enzymes: No results for input(s): "CKTOTAL", "CKMB", "CKMBINDEX", "TROPONINI" in the last 168 hours. CBG: Recent Labs  Lab 11/20/21 0818 11/20/21 1216 11/20/21 1759 11/20/21 1937 11/21/21 0753  GLUCAP 182* 138* 278* 253* 337*    Studies/Results: No results found. Medications:  ceFEPime (MAXIPIME) IV 1 g (11/20/21 2138)   ferric gluconate (FERRLECIT) IVPB Stopped (11/20/21 1117)   vancomycin Stopped (11/20/21 1117)    aspirin EC  81 mg Oral Daily   atorvastatin  10 mg Oral q1800   azithromycin  500 mg Oral Daily   benzonatate  200 mg Oral TID   calcitRIOL  1 mcg Oral 2 times per day on Mon Wed Fri   And   calcitRIOL  0.5 mcg Oral Once per day on Sun Tue Thu Sat   Chlorhexidine Gluconate Cloth  6 each Topical Q0600   Chlorhexidine Gluconate Cloth  6 each Topical Q0600   cinacalcet  30 mg Oral Q M,W,F   doxercalciferol  8 mcg Intravenous Q M,W,F-HD   enoxaparin (LOVENOX) injection  30 mg Subcutaneous Daily   gabapentin  300 mg Oral QHS   insulin aspart  0-9 Units Subcutaneous TID WC   insulin glargine-yfgn  9 Units Subcutaneous Daily   midodrine  10 mg Oral Once in dialysis   mycophenolate  250 mg Oral Daily   sevelamer carbonate  1,600 mg Oral TID with meals

## 2021-11-21 NOTE — Discharge Summary (Signed)
Physician Discharge Summary   Patient: Eric Mccarty MRN: [id number] DOB: 1955-07-12  Admit date:     11/18/2021  Discharge date: 11/21/21  Discharge Physician: Estill Cotta, MD    PCP: Corliss Parish, MD   Recommendations at discharge:   Continue to hold antihypertensives : amlodipine, labetalol Complete Zithromax 500 mg daily for 5 days Complete Vantin 200 mg daily for 5 days  Discharge Diagnoses:    Bilateral pneumonia   Acute respiratory failure with hypoxia ([place])   Severe sepsis ([place])   Uncontrolled type 2 diabetes mellitus with hyperglycemia, with long-term current use of insulin ([place])   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  Denied fevers or chest pain.    In ED, O2 sats down to 82%, placed on 2 L O2, no prior history of O2 use Initial glucose 611 with anion gap of 20, venous pH 7.49, BHB 0.4 COVID-19 negative.  CTA chest no PE but extensive bilateral groundglass airspace disease most compatible with pneumonia as well as bilateral pleural effusions.    Assessment and Plan:  Acute respiratory failure with hypoxia secondary to bilateral pneumonia and pulmonary edema in the setting of ESRD Severe sepsis -CT chest showed no PE, patient met sepsis criteria with fevers, tachypnea, hypoxia, leukocytosis, lactic  acidosis, and placed on BiPAP. -Patient  underwent urgent hemodialysis on 8/24, another hemodialysis on 8/25 per schedule -Initially placed on IV antibiotics, broad-spectrum, transition to oral Zithromax, Vantin for 5 days to complete full course for bilateral pneumonia      Uncontrolled type 2 diabetes mellitus with hyperglycemia, with long-term current use of insulin ([place])   Recent Labs (last 2 labs)           Recent Labs    11/19/21 1632 11/19/21 2038 11/19/21 2351 11/20/21 0444 11/20/21 0818 11/20/21 1216  GLUCAP 202* 315* 241* 121* 182* 138*     -Hemoglobin A1c 8.4 -Continue good diet, Actos, increase Lantus to 10 units daily     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 Labs were checked as part of his transplant work-up as requested by patient and his wife.    Pain control - [employer] Controlled Substance Reporting System database was reviewed. and patient was instructed, not to drive, operate heavy machinery, perform activities at heights, swimming or participation in water activities or provide baby-sitting services while on Pain, Sleep and Anxiety Medications; until their outpatient Physician has advised to do so again. Also recommended to not to take more than prescribed Pain, Sleep and Anxiety Medications.  Consultants: Renal Procedures performed: None Disposition: Home  Diet recommendation:  Discharge Diet Orders (From admission, onward)     Start     Ordered   11/21/21 0000  Diet Carb Modified        11/21/21 1101           Carb modified diet DISCHARGE MEDICATION: Allergies as of 11/21/2021   No  Known Allergies      Medication List     STOP taking these medications    amLODipine 5 MG tablet Commonly known as: NORVASC   doxycycline 100 MG capsule Commonly known as: VIBRAMYCIN   furosemide 40 MG tablet Commonly known as: LASIX   labetalol 200 MG tablet Commonly known as: NORMODYNE       TAKE these medications    acetaminophen 500 MG tablet Commonly known as: TYLENOL Take 1,000 mg by mouth every 6 (six) hours as needed for mild pain or moderate pain.   amitriptyline 25 MG tablet Commonly known as: ELAVIL Take 25 mg by mouth at bedtime.   aspirin 81 MG tablet Take 81 mg by mouth daily.   atorvastatin 10 MG tablet Commonly known as: LIPITOR Take 10 mg by mouth daily at 6 PM.   azithromycin 500 MG tablet Commonly known as: ZITHROMAX Take 1 tablet (500 mg total) by mouth daily for 5 days.   benzonatate 200 MG capsule Commonly known as: TESSALON Take 1 capsule (200 mg total) by mouth 3 (three) times daily as needed for cough.   cefpodoxime 200 MG tablet Commonly known as: VANTIN Take 1 tablet (200 mg total) by mouth daily for 5 days. On HD days, take after dialysis   fluorouracil 5 % cream Commonly known as: EFUDEX Apply 1 Application topically 2 (two) times daily.   gabapentin 600 MG tablet Commonly known as: NEURONTIN Take 300-600 mg by mouth at bedtime.   glipiZIDE 10 MG tablet Commonly known as: GLUCOTROL Take 10 mg by mouth daily.   guaiFENesin-codeine 100-10 MG/5ML syrup Take 5 mLs by mouth every 8 (eight) hours as needed for cough.   insulin glargine 100 UNIT/ML injection Commonly known as: LANTUS Inject 0.1 mLs (10 Units total) into the skin at bedtime. What changed: how much to take   mycophenolate 250 MG capsule Commonly known as: CELLCEPT Take 250 mg by mouth daily.   pioglitazone 45 MG tablet Commonly known as: ACTOS Take 45 mg by mouth daily.   sevelamer carbonate 800 MG tablet Commonly known as: RENVELA Take 1,600 mg by  mouth 3 (three) times daily.        Discharge Exam: Filed Weights   11/19/21 1031 11/19/21 1333 11/20/21 1227  Weight: 69.7 kg 63.7 kg 69.4 kg   S: No acute complaints, looking forward to go home.  Coughing better, no fevers or chills.  Vitals:   11/21/21 0500 11/21/21 0640 11/21/21 0722 11/21/21 0754  BP: 119/64   (!) 113/33  Pulse: 88   91  Resp: 17   20  Temp:  98.3 F (36.8 C)  98.3 F (36.8 C)  TempSrc:  Oral  Oral  SpO2: 97%  98% 98%  Weight:        Physical Exam General: Alert and oriented x 3, NAD Cardiovascular: S1 S2 clear, RRR.  Respiratory: CTAB, no wheezing, rales or rhonchi Gastrointestinal: Soft, nontender, nondistended, NBS Ext: no pedal edema bilaterally Neuro: no new deficits Psych: Normal affect and demeanor, alert and oriented x3   Condition at discharge: fair  The results of significant diagnostics from this hospitalization (including imaging,  microbiology, ancillary and laboratory) are listed below for reference.   Imaging Studies: DG Chest Portable 1 View  Result Date: 11/19/2021 CLINICAL DATA:  66 year old male with history of possible fluid in the lungs. EXAM: PORTABLE CHEST 1 VIEW COMPARISON:  Chest x-ray 11/18/2021. FINDINGS: There is marked cephalization of the pulmonary vasculature, severe indistinctness of the interstitial markings, and extensive multifocal airspace disease throughout the lungs bilaterally suggestive of severe pulmonary edema. Small bilateral pleural effusions. No pneumothorax. Heart size is mildly enlarged. Upper mediastinal contours are within normal limits. Atherosclerotic calcifications in the thoracic aorta. IMPRESSION: 1. The appearance the chest suggests worsening congestive heart failure, as above. The possibility of superimposed multilobar bilateral bronchopneumonia is not excluded. 2. Aortic atherosclerosis. Electronically Signed   By: Vinnie Langton M.D.   On: 11/19/2021 07:39   CT Angio Chest PE W and/or Wo  Contrast  Result Date: 11/19/2021 CLINICAL DATA:  Pulmonary embolism (PE) suspected, high prob. Shortness of breath, cough EXAM: CT ANGIOGRAPHY CHEST WITH CONTRAST TECHNIQUE: Multidetector CT imaging of the chest was performed using the standard protocol during bolus administration of intravenous contrast. Multiplanar CT image reconstructions and MIPs were obtained to evaluate the vascular anatomy. RADIATION DOSE REDUCTION: This exam was performed according to the departmental dose-optimization program which includes automated exposure control, adjustment of the mA and/or kV according to patient size and/or use of iterative reconstruction technique. CONTRAST:  19m OMNIPAQUE IOHEXOL 350 MG/ML SOLN COMPARISON:  None Available. FINDINGS: Cardiovascular: No filling defects in the pulmonary arteries to suggest pulmonary emboli. Heart upper limits normal in size. Diffuse coronary artery and scattered aortic calcifications. No aneurysm. Mediastinum/Nodes: Borderline sized mediastinal and bilateral hilar lymph nodes. No axillary adenopathy. Lungs/Pleura: Extensive bilateral airspace disease most compatible with pneumonia. Small bilateral pleural effusions. Upper Abdomen: No acute findings Musculoskeletal: Chest wall soft tissues are unremarkable. No acute bony abnormality. Review of the MIP images confirms the above findings. IMPRESSION: No evidence of pulmonary embolus. Extensive bilateral ground-glass airspace disease most compatible with pneumonia. Small bilateral pleural effusions. Diffuse coronary artery disease. Aortic Atherosclerosis (ICD10-I70.0). Electronically Signed   By: KRolm BaptiseM.D.   On: 11/19/2021 01:38   DG Chest 1 View  Result Date: 11/18/2021 CLINICAL DATA:  Shortness of breath, cough EXAM: CHEST  1 VIEW COMPARISON:  07/13/2009 FINDINGS: Transverse diameter of heart is increased. Central pulmonary vessels are prominent. Extensive patchy interstitial and alveolar densities are seen in both  lungs. There is blunting of both lateral CP angles. There is no pneumothorax. IMPRESSION: Interval appearance of extensive interstitial and alveolar densities in both parahilar regions and both lower lung fields. Findings suggest pulmonary edema or extensive bilateral pneumonia. Small bilateral pleural effusions are seen. Electronically Signed   By: PElmer PickerM.D.   On: 11/18/2021 20:39    Microbiology: Results for orders placed or performed during the hospital encounter of 11/18/21  SARS Coronavirus 2 by RT PCR (hospital order[facility]) *cepheid single result test* Anterior Nasal Swab     Status: None   Collection Time: 11/18/21  8:00 PM   Specimen: Anterior Nasal Swab  Result Value Ref Range Status   SARS Coronavirus 2 by RT PCR NEGATIVE NEGATIVE Final    Comment: (NOTE) SARS-CoV-2 target nucleic acids are NOT DETECTED.  The SARS-CoV-2 RNA is generally detectable in upper and lower respiratory specimens during the acute phase of infection. The lowest concentration of SARS-CoV-2 viral copies this assay can detect is 250 copies / mL. A negative result  does not preclude SARS-CoV-2 infection and should not be used as the sole basis for treatment or other patient management decisions.  A negative result may occur with improper specimen collection / handling, submission of specimen other than nasopharyngeal swab, presence of viral mutation(s) within the areas targeted by this assay, and inadequate number of viral copies (<250 copies / mL). A negative result must be combined with clinical observations, patient history, and epidemiological information.  Fact Sheet for Patients:   https://www.patel.info/  Fact Sheet for Healthcare Providers: https://hall.com/  This test is not yet approved or  cleared by the [place] FDA and has been authorized for detection and/or diagnosis of SARS-CoV-2 by FDA under an  Emergency Use Authorization (EUA).  This EUA will remain in effect (meaning this test can be used) for the duration of the COVID-19 declaration under Section 564(b)(1) of the Act, 21 U.S.C. section 360bbb-3(b)(1), unless the authorization is terminated or revoked sooner.  Performed at [facility], [place] [address], [place], [place] [address]   Blood culture (routine x 2)     Status: None (Preliminary result)   Collection Time: 11/19/21  2:24 AM   Specimen: BLOOD LEFT HAND  Result Value Ref Range Status   Specimen Description BLOOD LEFT HAND  Final   Special Requests   Final    BOTTLES DRAWN AEROBIC AND ANAEROBIC Blood Culture results may not be optimal due to an inadequate volume of blood received in culture bottles   Culture   Final    NO GROWTH 2 DAYS Performed at [facility], [place] [address], [place], [place] [address]    Report Status PENDING  Incomplete  Blood culture (routine x 2)     Status: None (Preliminary result)   Collection Time: 11/19/21  2:42 AM   Specimen: BLOOD LEFT HAND  Result Value Ref Range Status   Specimen Description BLOOD LEFT HAND  Final   Special Requests   Final    BOTTLES DRAWN AEROBIC AND ANAEROBIC Blood Culture adequate volume   Culture   Final    NO GROWTH 2 DAYS Performed at [facility], [place] [employer] [address], [place], [place] [address]    Report Status PENDING  Incomplete    Labs: CBC: Recent Labs  Lab 11/18/21 2000 11/19/21 0023 11/19/21 0533  WBC 14.8*  --  13.7*  NEUTROABS 12.0*  --   --   HGB 9.7* 10.2* 8.0*  HCT 29.8* 30.0* 24.7*  MCV 97.4  --  98.4  PLT 242  --  017   Basic Metabolic Panel: Recent Labs  Lab 11/18/21 2000 11/19/21 0023 11/19/21 0242 11/19/21 1455 11/20/21 0158 11/21/21 0212  NA 134* 129* 134*  --  137 134*  K 3.9 3.9 3.4*  --  3.3* 3.5  CL 89*  --  91*  --  95* 93*  CO2 25  --  25  --  25 25  GLUCOSE 611*  --  340*  --  189* 316*  BUN 23  --  28*  --  30* 38*  CREATININE 4.37*  --  5.05*   --  4.81* 5.23*  CALCIUM 8.9  --  9.2  --  9.4 9.2  PHOS  --   --   --  3.7  --   --    Liver Function Tests: Recent Labs  Lab 11/19/21 1455  ALBUMIN 2.7*   CBG: Recent Labs  Lab 11/20/21 1216 11/20/21 1759 11/20/21 1937 11/21/21 0753 11/21/21  [place]    Discharge time spent: greater than 30 minutes.  Signed: Estill Cotta, MD [facility] 11/21/2021

## 2021-11-21 NOTE — TOC Transition Note (Signed)
Transition of Care [facility]) - CM/SW Discharge Note   Patient Details  Name: Eric Mccarty MRN: [id number] Date of Birth: [birth year]  [facility] CM/SW Contact:  Bartholomew Crews, RN Phone Number: [phone] 11/21/2021, 11:44 AM   Clinical Narrative:     Patient to transition home today. Chart reviewed. No TOC needs identified at this time.   Final next level of care: Home/Self Care Barriers to Discharge: No Barriers Identified   Patient Goals and CMS Choice        Discharge Placement                       Discharge Plan and Services                                     Social Determinants of Health (SDOH) Interventions     Readmission Risk Interventions     No data to display

## 2021-11-23 NOTE — Progress Notes (Signed)
Late Note Entry:  Pt was d/c to home on Saturday. Contacted [facility] to advise clinic of pt's d/c and that pt should resume today.   Melven Sartorius Renal Navigator [phone]

## 2021-11-24 LAB — CULTURE, BLOOD (ROUTINE X 2)
Culture: NO GROWTH
Culture: NO GROWTH
Special Requests: ADEQUATE

## 2021-12-27 DEATH — deceased

## 2022-07-12 IMAGING — CT CT HEAD W/O CM
4 series · 17 of 47 positions shown, 19 images · non-contrast
Comparison: None.

CLINICAL DATA: Confusion

EXAM:
CT HEAD WITHOUT CONTRAST
TECHNIQUE: Contiguous axial images were obtained from the base of the skull
through the vertex without intravenous contrast.

[Series 3: head without · axial · non-contrast · 0.43mm/px · z∈[+1341,+1461]mm · 7 of 34 slices shown, 9 images]
[im 5/34  brain]
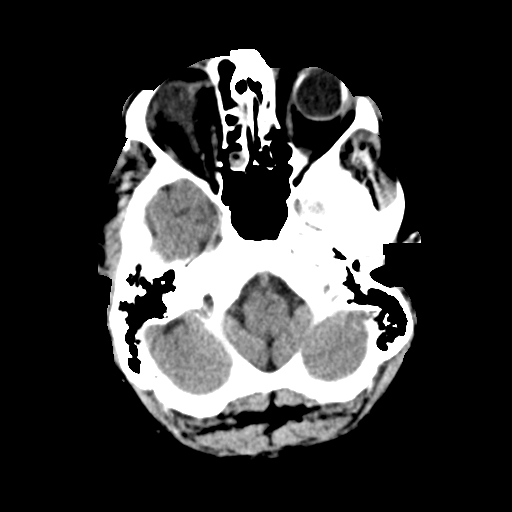
[im 5/34  bone]
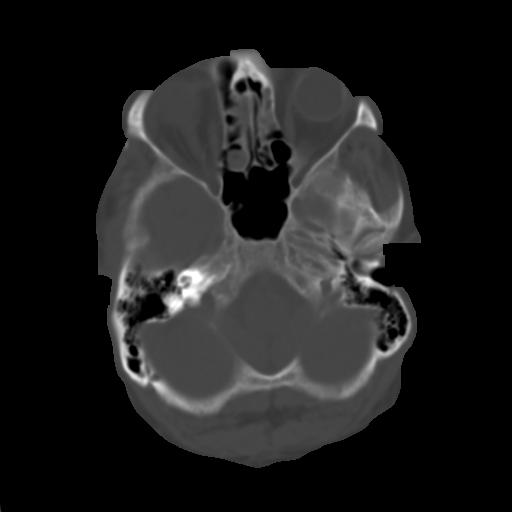
[im 9/34  brain]
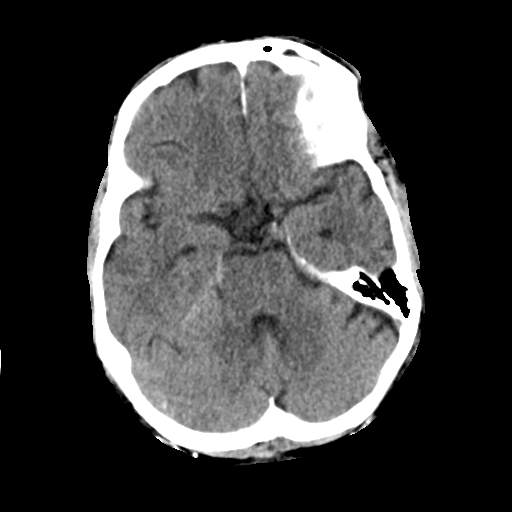
[im 13/34  brain]
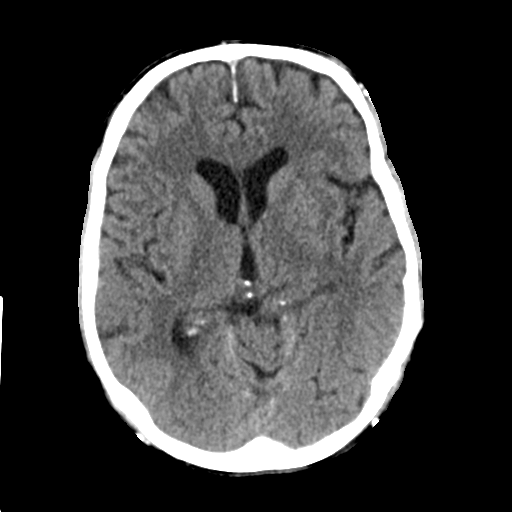
[im 17/34  brain]
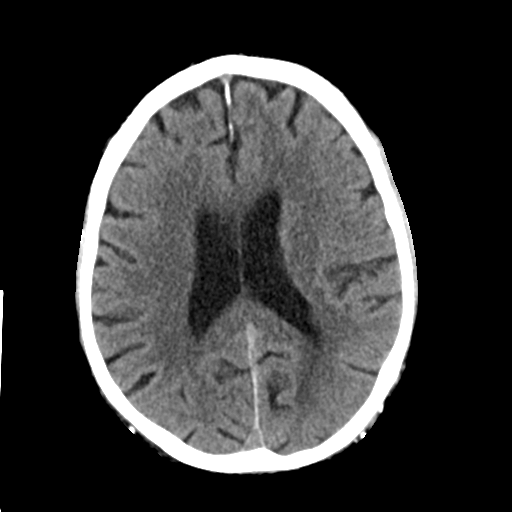
[im 21/34  brain]
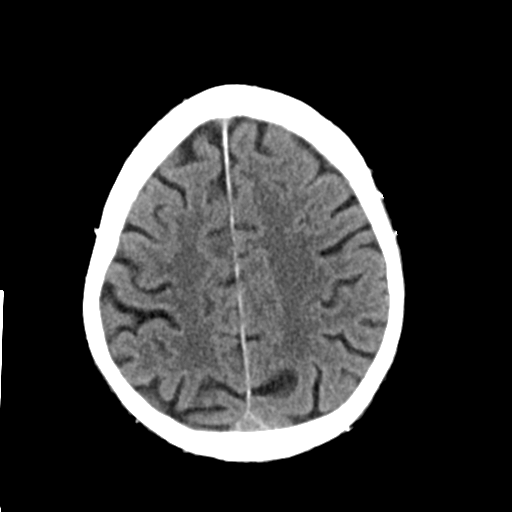
[im 21/34  bone]
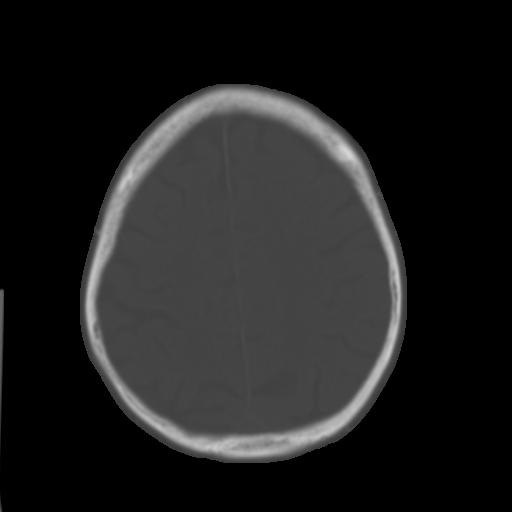
[im 25/34  brain]
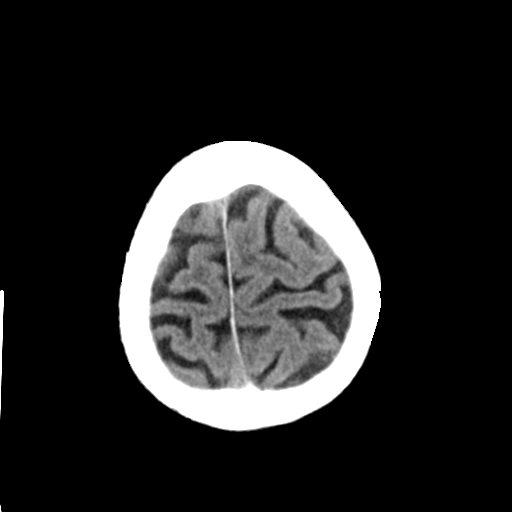
[im 29/34  brain]
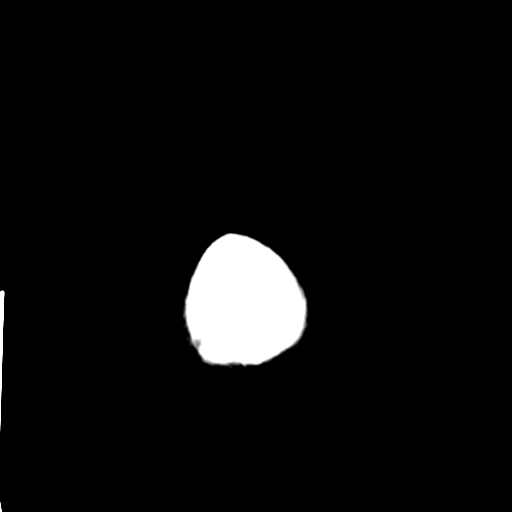

[Series 4: head bone · axial · 0.43mm/px · z∈[+1337,+1395]mm · 4 of 84 slices shown]
[im 9/84  bone]
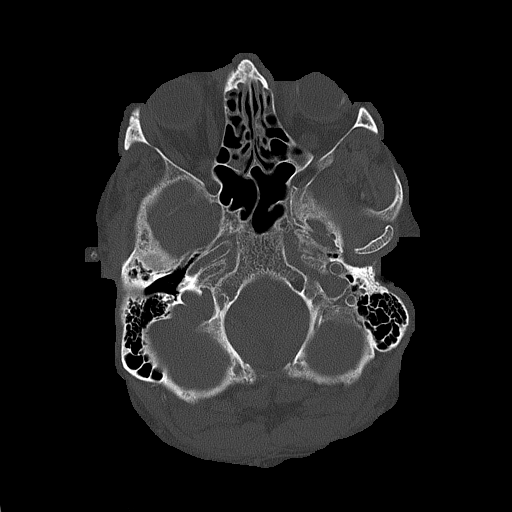
[im 17/84  bone]
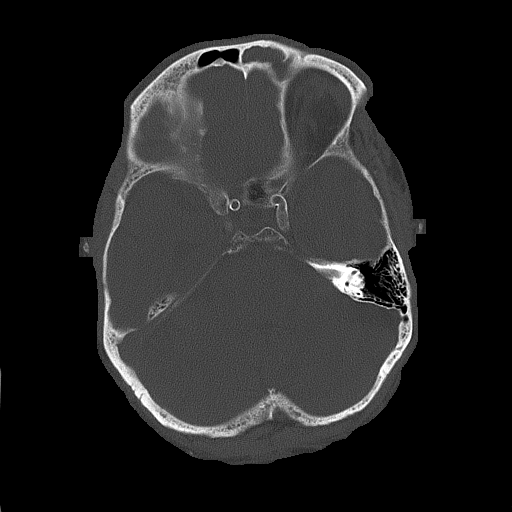
[im 25/84  bone]
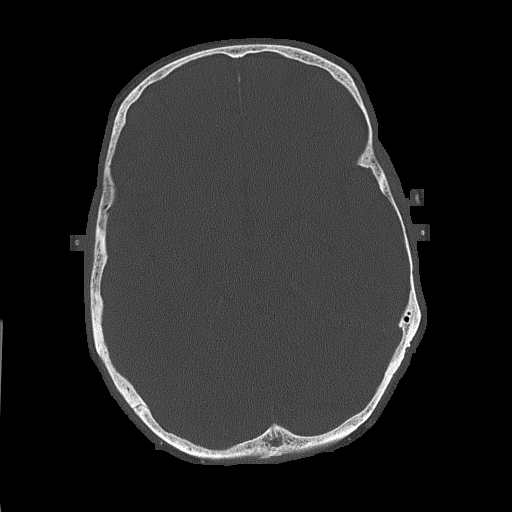
[im 38/84  bone]
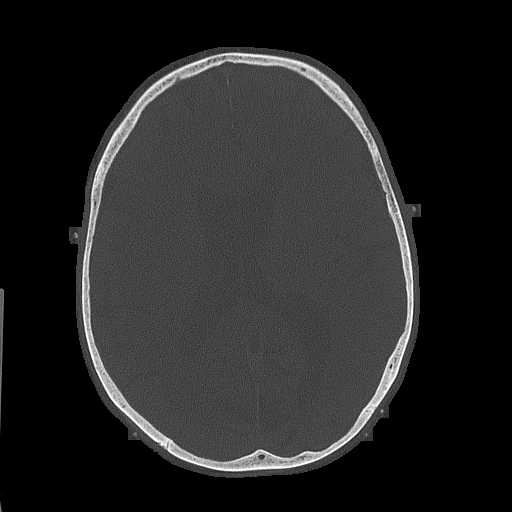

[Series 5: head without cor · coronal · non-contrast · 0.32mm/px · 3 of 67 slices shown]
[im 23/67  brain]
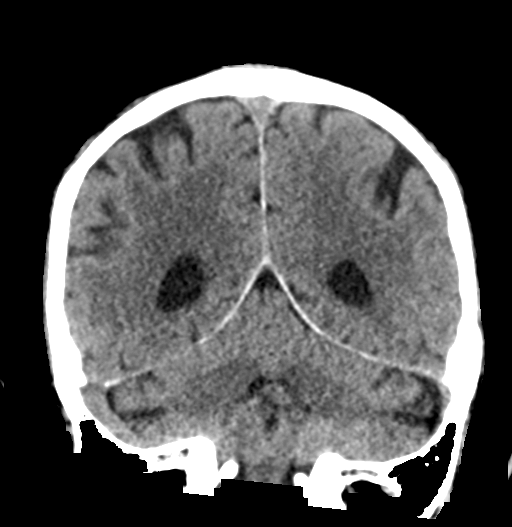
[im 30/67  brain]
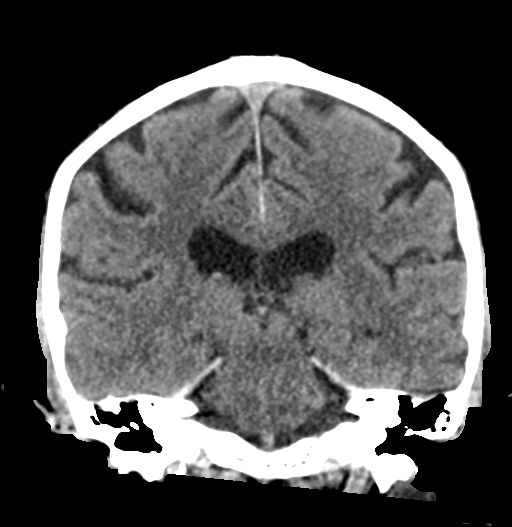
[im 37/67  brain]
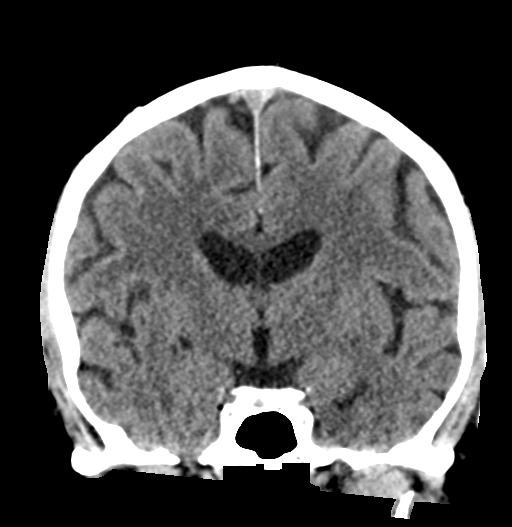

[Series 6: head without sag · sagittal · non-contrast · 0.30mm/px · 3 of 59 slices shown]
[im 20/59  brain]
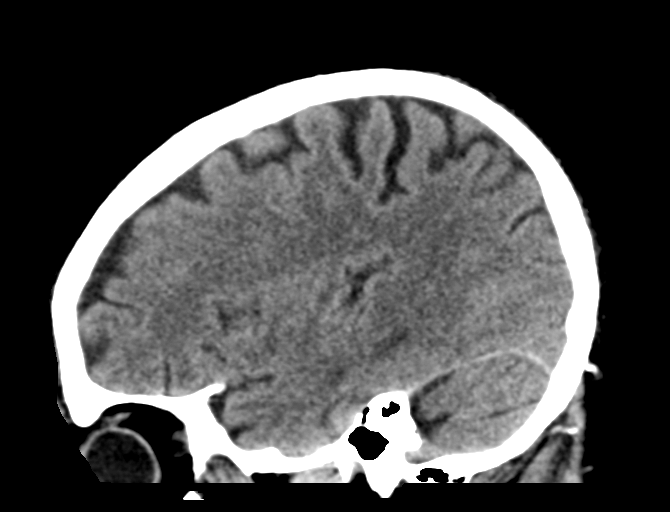
[im 30/59  brain]
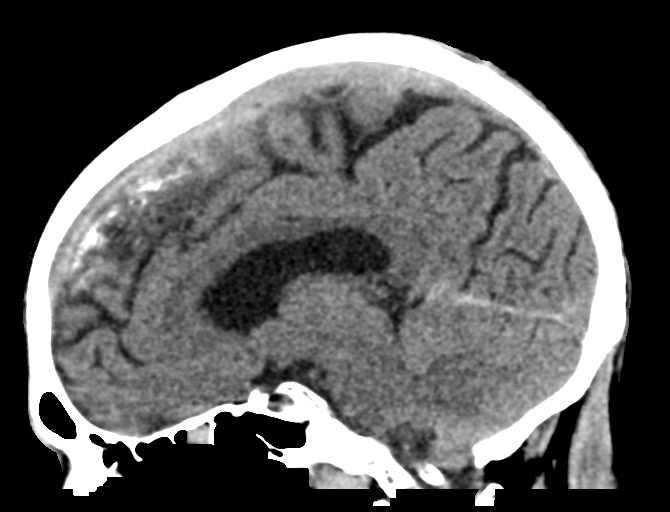
[im 39/59  brain]
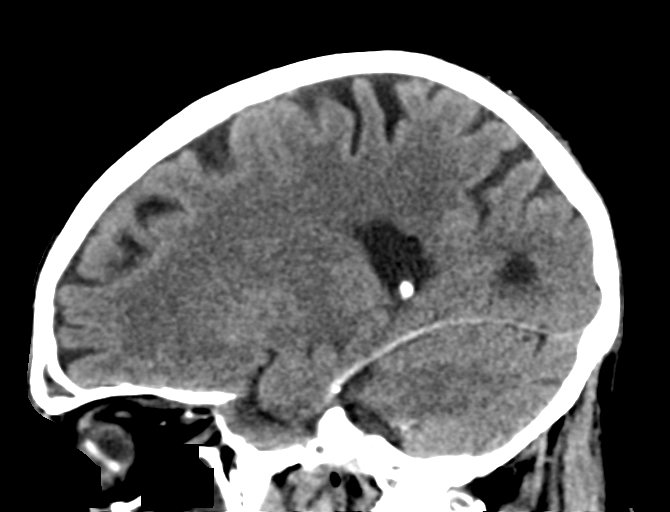

[17 of 47 positions shown; findings below may reference images not displayed]

FINDINGS: Brain: No acute territorial infarction, hemorrhage or intracranial
mass. The ventricles are nonenlarged. Age indeterminate lacunar
infarct versus prominent perivascular space in the left basal
ganglia

Vascular: No hyperdense vessels.  Carotid vascular calcification

Skull: Normal. Negative for fracture or focal lesion.

Sinuses/Orbits: Mucosal disease in the sinuses. Postsurgical changes
the left globe.

Other: None
IMPRESSION: 1. Small hypodensity in left basal ganglia may reflect age
indeterminate lacunar infarct versus prominent perivascular space
2. Otherwise negative non contrasted CT appearance of the brain for
age
